# Patient Record
Sex: Female | Born: 1935 | Race: White | Hispanic: No | Marital: Single | Smoking: Never smoker
Health system: Southern US, Community
[De-identification: ages and names within clinical notes are randomized; demographics above are authoritative.]

## PROBLEM LIST (undated history)

## (undated) DIAGNOSIS — K5790 Diverticulosis of intestine, part unspecified, without perforation or abscess without bleeding: Secondary | ICD-10-CM

## (undated) DIAGNOSIS — R413 Other amnesia: Secondary | ICD-10-CM

## (undated) DIAGNOSIS — G44209 Tension-type headache, unspecified, not intractable: Secondary | ICD-10-CM

## (undated) DIAGNOSIS — H8109 Meniere's disease, unspecified ear: Secondary | ICD-10-CM

## (undated) DIAGNOSIS — E039 Hypothyroidism, unspecified: Secondary | ICD-10-CM

## (undated) DIAGNOSIS — C439 Malignant melanoma of skin, unspecified: Secondary | ICD-10-CM

## (undated) DIAGNOSIS — Z8619 Personal history of other infectious and parasitic diseases: Secondary | ICD-10-CM

## (undated) DIAGNOSIS — J309 Allergic rhinitis, unspecified: Secondary | ICD-10-CM

## (undated) DIAGNOSIS — I10 Essential (primary) hypertension: Secondary | ICD-10-CM

## (undated) DIAGNOSIS — T7840XA Allergy, unspecified, initial encounter: Secondary | ICD-10-CM

## (undated) DIAGNOSIS — F419 Anxiety disorder, unspecified: Secondary | ICD-10-CM

## (undated) DIAGNOSIS — K648 Other hemorrhoids: Secondary | ICD-10-CM

## (undated) DIAGNOSIS — D126 Benign neoplasm of colon, unspecified: Secondary | ICD-10-CM

## (undated) DIAGNOSIS — E7800 Pure hypercholesterolemia, unspecified: Secondary | ICD-10-CM

## (undated) DIAGNOSIS — Z7901 Long term (current) use of anticoagulants: Secondary | ICD-10-CM

## (undated) DIAGNOSIS — I4891 Unspecified atrial fibrillation: Secondary | ICD-10-CM

## (undated) HISTORY — DX: Diverticulosis of intestine, part unspecified, without perforation or abscess without bleeding: K57.90

## (undated) HISTORY — DX: Hypothyroidism, unspecified: E03.9

## (undated) HISTORY — DX: Malignant melanoma of skin, unspecified: C43.9

## (undated) HISTORY — DX: Personal history of other infectious and parasitic diseases: Z86.19

## (undated) HISTORY — DX: Tension-type headache, unspecified, not intractable: G44.209

## (undated) HISTORY — DX: Allergic rhinitis, unspecified: J30.9

## (undated) HISTORY — DX: Anxiety disorder, unspecified: F41.9

## (undated) HISTORY — DX: Other amnesia: R41.3

## (undated) HISTORY — DX: Other hemorrhoids: K64.8

## (undated) HISTORY — DX: Meniere's disease, unspecified ear: H81.09

## (undated) HISTORY — DX: Long term (current) use of anticoagulants: Z79.01

## (undated) HISTORY — DX: Allergy, unspecified, initial encounter: T78.40XA

## (undated) HISTORY — DX: Benign neoplasm of colon, unspecified: D12.6

---

## 1997-07-17 DIAGNOSIS — D126 Benign neoplasm of colon, unspecified: Secondary | ICD-10-CM

## 1997-07-17 HISTORY — DX: Benign neoplasm of colon, unspecified: D12.6

## 1998-03-18 ENCOUNTER — Other Ambulatory Visit: Admission: RE | Admit: 1998-03-18 | Discharge: 1998-03-18 | Payer: Self-pay | Admitting: Internal Medicine

## 1999-03-20 ENCOUNTER — Other Ambulatory Visit: Admission: RE | Admit: 1999-03-20 | Discharge: 1999-03-20 | Payer: Self-pay | Admitting: Internal Medicine

## 2000-04-20 ENCOUNTER — Other Ambulatory Visit: Admission: RE | Admit: 2000-04-20 | Discharge: 2000-04-20 | Payer: Self-pay | Admitting: Internal Medicine

## 2000-05-21 ENCOUNTER — Encounter: Payer: Self-pay | Admitting: Internal Medicine

## 2000-05-21 ENCOUNTER — Encounter: Admission: RE | Admit: 2000-05-21 | Discharge: 2000-05-21 | Payer: Self-pay | Admitting: Internal Medicine

## 2000-06-25 ENCOUNTER — Ambulatory Visit (HOSPITAL_COMMUNITY): Admission: RE | Admit: 2000-06-25 | Discharge: 2000-06-25 | Payer: Self-pay | Admitting: *Deleted

## 2001-04-22 ENCOUNTER — Other Ambulatory Visit: Admission: RE | Admit: 2001-04-22 | Discharge: 2001-04-22 | Payer: Self-pay | Admitting: Internal Medicine

## 2001-05-31 ENCOUNTER — Encounter: Admission: RE | Admit: 2001-05-31 | Discharge: 2001-05-31 | Payer: Self-pay | Admitting: Internal Medicine

## 2001-05-31 ENCOUNTER — Encounter: Payer: Self-pay | Admitting: Internal Medicine

## 2002-05-12 ENCOUNTER — Other Ambulatory Visit: Admission: RE | Admit: 2002-05-12 | Discharge: 2002-05-12 | Payer: Self-pay | Admitting: Internal Medicine

## 2002-05-31 ENCOUNTER — Encounter: Admission: RE | Admit: 2002-05-31 | Discharge: 2002-05-31 | Payer: Self-pay | Admitting: Internal Medicine

## 2002-05-31 ENCOUNTER — Encounter: Payer: Self-pay | Admitting: Internal Medicine

## 2003-06-05 ENCOUNTER — Encounter: Admission: RE | Admit: 2003-06-05 | Discharge: 2003-06-05 | Payer: Self-pay | Admitting: Internal Medicine

## 2003-06-05 ENCOUNTER — Encounter: Payer: Self-pay | Admitting: Internal Medicine

## 2004-06-12 ENCOUNTER — Encounter: Admission: RE | Admit: 2004-06-12 | Discharge: 2004-06-12 | Payer: Self-pay | Admitting: Internal Medicine

## 2004-06-17 ENCOUNTER — Other Ambulatory Visit: Admission: RE | Admit: 2004-06-17 | Discharge: 2004-06-17 | Payer: Self-pay | Admitting: Internal Medicine

## 2005-03-02 ENCOUNTER — Encounter: Admission: RE | Admit: 2005-03-02 | Discharge: 2005-03-02 | Payer: Self-pay | Admitting: Otolaryngology

## 2005-06-22 ENCOUNTER — Encounter: Admission: RE | Admit: 2005-06-22 | Discharge: 2005-06-22 | Payer: Self-pay | Admitting: Internal Medicine

## 2005-10-10 ENCOUNTER — Emergency Department (HOSPITAL_COMMUNITY): Admission: EM | Admit: 2005-10-10 | Discharge: 2005-10-10 | Payer: Self-pay | Admitting: Emergency Medicine

## 2006-06-23 ENCOUNTER — Encounter: Admission: RE | Admit: 2006-06-23 | Discharge: 2006-06-23 | Payer: Self-pay | Admitting: Internal Medicine

## 2007-04-22 ENCOUNTER — Other Ambulatory Visit: Admission: RE | Admit: 2007-04-22 | Discharge: 2007-04-22 | Payer: Self-pay | Admitting: Internal Medicine

## 2007-04-25 ENCOUNTER — Encounter: Admission: RE | Admit: 2007-04-25 | Discharge: 2007-04-25 | Payer: Self-pay | Admitting: Internal Medicine

## 2007-06-29 ENCOUNTER — Encounter: Admission: RE | Admit: 2007-06-29 | Discharge: 2007-06-29 | Payer: Self-pay | Admitting: Internal Medicine

## 2008-06-29 ENCOUNTER — Encounter: Admission: RE | Admit: 2008-06-29 | Discharge: 2008-06-29 | Payer: Self-pay | Admitting: Internal Medicine

## 2009-07-04 ENCOUNTER — Encounter: Admission: RE | Admit: 2009-07-04 | Discharge: 2009-07-04 | Payer: Self-pay | Admitting: Internal Medicine

## 2010-06-19 ENCOUNTER — Encounter: Admission: RE | Admit: 2010-06-19 | Discharge: 2010-06-19 | Payer: Self-pay | Admitting: Internal Medicine

## 2010-07-07 ENCOUNTER — Encounter: Admission: RE | Admit: 2010-07-07 | Discharge: 2010-07-07 | Payer: Self-pay | Admitting: Internal Medicine

## 2010-09-28 HISTORY — PX: CATARACT EXTRACTION: SUR2

## 2010-10-19 ENCOUNTER — Encounter: Payer: Self-pay | Admitting: Internal Medicine

## 2011-02-13 NOTE — Procedures (Signed)
[facility]  Patient:    Jamie Thompson, Jamie Thompson                        MRN: [id number] Proc. Date: 06/25/00 Adm. Date:  [id number] Attending:  Sabino Gasser                           Procedure Report  PROCEDURE:  Colonoscopy.  INDICATION FOR PROCEDURE:  Colon polyps.  ANESTHESIA:  Demerol 100 mg, Versed 8 mg was given intravenously.  DESCRIPTION OF PROCEDURE:  With the patient mildly sedated in the left lateral decubitus position with pressure applied to the abdomen, the Olympus videoscopic pediatric colonoscope was inserted in the rectum and passed under direct vision to the cecum to a very tortuous colon. The cecum was identified by the ileocecal valve and appendiceal orifice both of which were photographed. From this point, the colonoscope was slowly withdrawn taking circumferential views of the entire colonic mucosa, stopping in the rectum which appeared normal in direct view and showed small internal hemorrhoids on retroflexed view. The endoscope was straightened and withdrawn. The patients vital signs and pulse oximeter remained stable. The patient tolerated the procedure well without apparent complications.  FINDINGS:  Internal hemorrhoids otherwise negative exam.  PLAN:  Follow-up exam in possibly 5 years. DD:  06/25/00 TD:  06/25/00 Job: [id number]

## 2011-06-05 ENCOUNTER — Other Ambulatory Visit: Payer: Self-pay | Admitting: Internal Medicine

## 2011-06-05 DIAGNOSIS — Z1231 Encounter for screening mammogram for malignant neoplasm of breast: Secondary | ICD-10-CM

## 2011-07-09 ENCOUNTER — Ambulatory Visit: Payer: Self-pay

## 2011-07-31 ENCOUNTER — Ambulatory Visit
Admission: RE | Admit: 2011-07-31 | Discharge: 2011-07-31 | Disposition: A | Discharge status: Home / Self Care | Payer: Medicare Other | Source: Ambulatory Visit | Attending: Internal Medicine | Admitting: Internal Medicine

## 2011-07-31 DIAGNOSIS — Z1231 Encounter for screening mammogram for malignant neoplasm of breast: Secondary | ICD-10-CM

## 2011-10-02 DIAGNOSIS — L578 Other skin changes due to chronic exposure to nonionizing radiation: Secondary | ICD-10-CM | POA: Diagnosis not present

## 2011-10-02 DIAGNOSIS — L570 Actinic keratosis: Secondary | ICD-10-CM | POA: Diagnosis not present

## 2011-10-06 DIAGNOSIS — I4891 Unspecified atrial fibrillation: Secondary | ICD-10-CM | POA: Diagnosis not present

## 2011-10-06 DIAGNOSIS — Z7901 Long term (current) use of anticoagulants: Secondary | ICD-10-CM | POA: Diagnosis not present

## 2011-11-09 DIAGNOSIS — I4891 Unspecified atrial fibrillation: Secondary | ICD-10-CM | POA: Diagnosis not present

## 2011-11-09 DIAGNOSIS — Z7901 Long term (current) use of anticoagulants: Secondary | ICD-10-CM | POA: Diagnosis not present

## 2011-11-11 DIAGNOSIS — H35369 Drusen (degenerative) of macula, unspecified eye: Secondary | ICD-10-CM | POA: Diagnosis not present

## 2011-12-07 DIAGNOSIS — Z7901 Long term (current) use of anticoagulants: Secondary | ICD-10-CM | POA: Diagnosis not present

## 2011-12-07 DIAGNOSIS — I4891 Unspecified atrial fibrillation: Secondary | ICD-10-CM | POA: Diagnosis not present

## 2011-12-15 DIAGNOSIS — E7800 Pure hypercholesterolemia, unspecified: Secondary | ICD-10-CM | POA: Diagnosis not present

## 2011-12-15 DIAGNOSIS — I10 Essential (primary) hypertension: Secondary | ICD-10-CM | POA: Diagnosis not present

## 2011-12-17 DIAGNOSIS — L570 Actinic keratosis: Secondary | ICD-10-CM | POA: Diagnosis not present

## 2011-12-17 DIAGNOSIS — E7800 Pure hypercholesterolemia, unspecified: Secondary | ICD-10-CM | POA: Diagnosis not present

## 2011-12-17 DIAGNOSIS — K589 Irritable bowel syndrome without diarrhea: Secondary | ICD-10-CM | POA: Diagnosis not present

## 2011-12-17 DIAGNOSIS — I10 Essential (primary) hypertension: Secondary | ICD-10-CM | POA: Diagnosis not present

## 2012-01-07 DIAGNOSIS — I10 Essential (primary) hypertension: Secondary | ICD-10-CM | POA: Diagnosis not present

## 2012-01-07 DIAGNOSIS — I4891 Unspecified atrial fibrillation: Secondary | ICD-10-CM | POA: Diagnosis not present

## 2012-01-07 DIAGNOSIS — Z7901 Long term (current) use of anticoagulants: Secondary | ICD-10-CM | POA: Diagnosis not present

## 2012-01-18 DIAGNOSIS — R197 Diarrhea, unspecified: Secondary | ICD-10-CM | POA: Diagnosis not present

## 2012-01-18 DIAGNOSIS — J45909 Unspecified asthma, uncomplicated: Secondary | ICD-10-CM | POA: Diagnosis not present

## 2012-01-25 DIAGNOSIS — J45909 Unspecified asthma, uncomplicated: Secondary | ICD-10-CM | POA: Diagnosis not present

## 2012-02-08 DIAGNOSIS — Z7901 Long term (current) use of anticoagulants: Secondary | ICD-10-CM | POA: Diagnosis not present

## 2012-02-08 DIAGNOSIS — I10 Essential (primary) hypertension: Secondary | ICD-10-CM | POA: Diagnosis not present

## 2012-02-08 DIAGNOSIS — I4891 Unspecified atrial fibrillation: Secondary | ICD-10-CM | POA: Diagnosis not present

## 2012-02-24 DIAGNOSIS — T148 Other injury of unspecified body region: Secondary | ICD-10-CM | POA: Diagnosis not present

## 2012-02-24 DIAGNOSIS — H8109 Meniere's disease, unspecified ear: Secondary | ICD-10-CM | POA: Diagnosis not present

## 2012-02-24 DIAGNOSIS — L0390 Cellulitis, unspecified: Secondary | ICD-10-CM | POA: Diagnosis not present

## 2012-02-24 DIAGNOSIS — H905 Unspecified sensorineural hearing loss: Secondary | ICD-10-CM | POA: Diagnosis not present

## 2012-02-24 DIAGNOSIS — W57XXXA Bitten or stung by nonvenomous insect and other nonvenomous arthropods, initial encounter: Secondary | ICD-10-CM | POA: Diagnosis not present

## 2012-02-24 DIAGNOSIS — H903 Sensorineural hearing loss, bilateral: Secondary | ICD-10-CM | POA: Diagnosis not present

## 2012-02-24 DIAGNOSIS — L0291 Cutaneous abscess, unspecified: Secondary | ICD-10-CM | POA: Diagnosis not present

## 2012-03-15 DIAGNOSIS — Z7901 Long term (current) use of anticoagulants: Secondary | ICD-10-CM | POA: Diagnosis not present

## 2012-03-15 DIAGNOSIS — I4891 Unspecified atrial fibrillation: Secondary | ICD-10-CM | POA: Diagnosis not present

## 2012-03-15 DIAGNOSIS — I10 Essential (primary) hypertension: Secondary | ICD-10-CM | POA: Diagnosis not present

## 2012-04-05 DIAGNOSIS — F411 Generalized anxiety disorder: Secondary | ICD-10-CM | POA: Diagnosis not present

## 2012-04-14 DIAGNOSIS — I4891 Unspecified atrial fibrillation: Secondary | ICD-10-CM | POA: Diagnosis not present

## 2012-04-14 DIAGNOSIS — I10 Essential (primary) hypertension: Secondary | ICD-10-CM | POA: Diagnosis not present

## 2012-04-14 DIAGNOSIS — Z7901 Long term (current) use of anticoagulants: Secondary | ICD-10-CM | POA: Diagnosis not present

## 2012-05-16 DIAGNOSIS — I10 Essential (primary) hypertension: Secondary | ICD-10-CM | POA: Diagnosis not present

## 2012-05-16 DIAGNOSIS — Z7901 Long term (current) use of anticoagulants: Secondary | ICD-10-CM | POA: Diagnosis not present

## 2012-05-16 DIAGNOSIS — I4891 Unspecified atrial fibrillation: Secondary | ICD-10-CM | POA: Diagnosis not present

## 2012-05-28 ENCOUNTER — Emergency Department (HOSPITAL_BASED_OUTPATIENT_CLINIC_OR_DEPARTMENT_OTHER)
Admission: EM | Admit: 2012-05-28 | Discharge: 2012-05-28 | Disposition: A | Discharge status: Home / Self Care | Payer: Medicare Other | Attending: Emergency Medicine | Admitting: Emergency Medicine

## 2012-05-28 ENCOUNTER — Encounter (HOSPITAL_BASED_OUTPATIENT_CLINIC_OR_DEPARTMENT_OTHER): Payer: Self-pay | Admitting: *Deleted

## 2012-05-28 DIAGNOSIS — T63441A Toxic effect of venom of bees, accidental (unintentional), initial encounter: Secondary | ICD-10-CM

## 2012-05-28 DIAGNOSIS — T63461A Toxic effect of venom of wasps, accidental (unintentional), initial encounter: Secondary | ICD-10-CM | POA: Insufficient documentation

## 2012-05-28 DIAGNOSIS — E079 Disorder of thyroid, unspecified: Secondary | ICD-10-CM | POA: Insufficient documentation

## 2012-05-28 DIAGNOSIS — E7800 Pure hypercholesterolemia, unspecified: Secondary | ICD-10-CM | POA: Diagnosis not present

## 2012-05-28 DIAGNOSIS — I10 Essential (primary) hypertension: Secondary | ICD-10-CM | POA: Insufficient documentation

## 2012-05-28 DIAGNOSIS — I4891 Unspecified atrial fibrillation: Secondary | ICD-10-CM | POA: Insufficient documentation

## 2012-05-28 DIAGNOSIS — T6391XA Toxic effect of contact with unspecified venomous animal, accidental (unintentional), initial encounter: Secondary | ICD-10-CM | POA: Diagnosis not present

## 2012-05-28 HISTORY — DX: Pure hypercholesterolemia, unspecified: E78.00

## 2012-05-28 HISTORY — DX: Unspecified atrial fibrillation: I48.91

## 2012-05-28 HISTORY — DX: Essential (primary) hypertension: I10

## 2012-05-28 MED ORDER — PREDNISONE 10 MG PO TABS: 20.0000 mg | ORAL_TABLET | Freq: Every day | ORAL | Status: DC

## 2012-05-28 NOTE — ED Provider Notes (Signed)
History     CSN: [id number]  Arrival date & time 05/28/12  1239   First MD Initiated Contact with Patient 05/28/12 1301      Chief Complaint  Patient presents with  . Insect Bite    (Consider location/radiation/quality/duration/timing/severity/associated sxs/prior treatment) HPI Patient complaining of bee sting in multiple places. She was cleaning  out a [place] when a nest of bees flew out and stung her multiple times. She was stung on the back of her hand and forearm multiple times and on her right chest this occurred yesterday morning at 10:30. She has been taking Benadryl. She has a more localized redness and swelling of the right mid arm. She has not any difficulty breathing, nausea, vomiting, or lightheadedness Past Medical History  Diagnosis Date  . Hypertension   . Hypercholesteremia   . Atrial fibrillation   . Thyroid disease     History reviewed. No pertinent past surgical history.  History reviewed. No pertinent family history.  History  Substance Use Topics  . Smoking status: Never Smoker   . Smokeless tobacco: Not on file  . Alcohol Use: No    OB History    Grav Para Term Preterm Abortions TAB SAB Ect Mult Living                  Review of Systems  Constitutional: Negative for fever, chills, activity change, appetite change and unexpected weight change.  HENT: Negative for sore throat, rhinorrhea, neck pain, neck stiffness and sinus pressure.   Eyes: Negative for visual disturbance.  Respiratory: Negative for cough and shortness of breath.   Cardiovascular: Negative for chest pain and leg swelling.  Gastrointestinal: Negative for vomiting, abdominal pain, diarrhea and blood in stool.  Genitourinary: Negative for dysuria, urgency, frequency, vaginal discharge and difficulty urinating.  Musculoskeletal: Negative for myalgias, arthralgias and gait problem.  Skin: Negative for color change and rash.  Neurological: Negative for weakness, light-headedness and  headaches.  Hematological: Does not bruise/bleed easily.  Psychiatric/Behavioral: Negative for dysphoric mood.    Allergies  Review of patient's allergies indicates not on file.  Home Medications  No current outpatient prescriptions on file.  BP 142/74  Pulse 77  Temp 97.5 F (36.4 C) (Oral)  Resp 16  SpO2 99%  Physical Exam  Nursing note and vitals reviewed. Constitutional: She appears well-developed and well-nourished.  HENT:  Head: Normocephalic and atraumatic.  Eyes: Conjunctivae are normal. Pupils are equal, round, and reactive to light.  Neck: Normal range of motion. Neck supple.  Cardiovascular: An irregularly irregular rhythm present.  Pulmonary/Chest: Effort normal and breath sounds normal.  Abdominal: Soft. Bowel sounds are normal.  Musculoskeletal: Normal range of motion.  Neurological: She is alert.  Skin: Skin is warm and dry.       Erythema and redness dorsal aspect of right hand, right mid upper extremity, left face  Psychiatric: She has a normal mood and affect.    ED Course  Procedures (including critical care time)  Labs Reviewed - No data to display No results found.   No diagnosis found.    MDM  Plan continued benadryl and prednisone        Hilario Quarry, MD 05/28/12 1314

## 2012-05-28 NOTE — ED Notes (Signed)
Dr. Rosalia Hammers with patient. Pt states she got stung numerous times yesterday. No distress at present.

## 2012-05-31 DIAGNOSIS — I10 Essential (primary) hypertension: Secondary | ICD-10-CM | POA: Diagnosis not present

## 2012-05-31 DIAGNOSIS — Z7901 Long term (current) use of anticoagulants: Secondary | ICD-10-CM | POA: Diagnosis not present

## 2012-05-31 DIAGNOSIS — I4891 Unspecified atrial fibrillation: Secondary | ICD-10-CM | POA: Diagnosis not present

## 2012-06-30 DIAGNOSIS — I10 Essential (primary) hypertension: Secondary | ICD-10-CM | POA: Diagnosis not present

## 2012-06-30 DIAGNOSIS — I4891 Unspecified atrial fibrillation: Secondary | ICD-10-CM | POA: Diagnosis not present

## 2012-06-30 DIAGNOSIS — Z7901 Long term (current) use of anticoagulants: Secondary | ICD-10-CM | POA: Diagnosis not present

## 2012-07-01 DIAGNOSIS — Z5181 Encounter for therapeutic drug level monitoring: Secondary | ICD-10-CM | POA: Diagnosis not present

## 2012-07-01 DIAGNOSIS — L02419 Cutaneous abscess of limb, unspecified: Secondary | ICD-10-CM | POA: Diagnosis not present

## 2012-07-01 DIAGNOSIS — Z888 Allergy status to other drugs, medicaments and biological substances status: Secondary | ICD-10-CM | POA: Diagnosis not present

## 2012-07-04 ENCOUNTER — Other Ambulatory Visit: Payer: Self-pay | Admitting: Internal Medicine

## 2012-07-04 DIAGNOSIS — Z1231 Encounter for screening mammogram for malignant neoplasm of breast: Secondary | ICD-10-CM

## 2012-07-08 DIAGNOSIS — Z7901 Long term (current) use of anticoagulants: Secondary | ICD-10-CM | POA: Diagnosis not present

## 2012-07-08 DIAGNOSIS — E7800 Pure hypercholesterolemia, unspecified: Secondary | ICD-10-CM | POA: Diagnosis not present

## 2012-07-08 DIAGNOSIS — E039 Hypothyroidism, unspecified: Secondary | ICD-10-CM | POA: Diagnosis not present

## 2012-07-08 DIAGNOSIS — I10 Essential (primary) hypertension: Secondary | ICD-10-CM | POA: Diagnosis not present

## 2012-07-11 DIAGNOSIS — Z0000 Encounter for general adult medical examination without abnormal findings: Secondary | ICD-10-CM | POA: Diagnosis not present

## 2012-07-13 DIAGNOSIS — E7800 Pure hypercholesterolemia, unspecified: Secondary | ICD-10-CM | POA: Diagnosis not present

## 2012-07-13 DIAGNOSIS — Z7901 Long term (current) use of anticoagulants: Secondary | ICD-10-CM | POA: Diagnosis not present

## 2012-07-13 DIAGNOSIS — I4891 Unspecified atrial fibrillation: Secondary | ICD-10-CM | POA: Diagnosis not present

## 2012-07-13 DIAGNOSIS — Z23 Encounter for immunization: Secondary | ICD-10-CM | POA: Diagnosis not present

## 2012-07-13 DIAGNOSIS — Z0000 Encounter for general adult medical examination without abnormal findings: Secondary | ICD-10-CM | POA: Diagnosis not present

## 2012-07-13 DIAGNOSIS — Z1212 Encounter for screening for malignant neoplasm of rectum: Secondary | ICD-10-CM | POA: Diagnosis not present

## 2012-07-13 DIAGNOSIS — I10 Essential (primary) hypertension: Secondary | ICD-10-CM | POA: Diagnosis not present

## 2012-08-01 DIAGNOSIS — Z7901 Long term (current) use of anticoagulants: Secondary | ICD-10-CM | POA: Diagnosis not present

## 2012-08-01 DIAGNOSIS — I10 Essential (primary) hypertension: Secondary | ICD-10-CM | POA: Diagnosis not present

## 2012-08-01 DIAGNOSIS — I4891 Unspecified atrial fibrillation: Secondary | ICD-10-CM | POA: Diagnosis not present

## 2012-08-02 ENCOUNTER — Ambulatory Visit
Admission: RE | Admit: 2012-08-02 | Discharge: 2012-08-02 | Disposition: A | Discharge status: Home / Self Care | Payer: Medicare Other | Source: Ambulatory Visit | Attending: Internal Medicine | Admitting: Internal Medicine

## 2012-08-02 DIAGNOSIS — Z1231 Encounter for screening mammogram for malignant neoplasm of breast: Secondary | ICD-10-CM | POA: Diagnosis not present

## 2012-09-01 DIAGNOSIS — Z7901 Long term (current) use of anticoagulants: Secondary | ICD-10-CM | POA: Diagnosis not present

## 2012-09-01 DIAGNOSIS — I10 Essential (primary) hypertension: Secondary | ICD-10-CM | POA: Diagnosis not present

## 2012-09-01 DIAGNOSIS — I4891 Unspecified atrial fibrillation: Secondary | ICD-10-CM | POA: Diagnosis not present

## 2012-10-03 DIAGNOSIS — I10 Essential (primary) hypertension: Secondary | ICD-10-CM | POA: Diagnosis not present

## 2012-10-03 DIAGNOSIS — Z7901 Long term (current) use of anticoagulants: Secondary | ICD-10-CM | POA: Diagnosis not present

## 2012-10-03 DIAGNOSIS — I4891 Unspecified atrial fibrillation: Secondary | ICD-10-CM | POA: Diagnosis not present

## 2012-10-12 DIAGNOSIS — H2510 Age-related nuclear cataract, unspecified eye: Secondary | ICD-10-CM | POA: Diagnosis not present

## 2012-10-12 DIAGNOSIS — Z961 Presence of intraocular lens: Secondary | ICD-10-CM | POA: Diagnosis not present

## 2012-10-17 DIAGNOSIS — I10 Essential (primary) hypertension: Secondary | ICD-10-CM | POA: Diagnosis not present

## 2012-10-17 DIAGNOSIS — Z7901 Long term (current) use of anticoagulants: Secondary | ICD-10-CM | POA: Diagnosis not present

## 2012-10-17 DIAGNOSIS — I4891 Unspecified atrial fibrillation: Secondary | ICD-10-CM | POA: Diagnosis not present

## 2012-10-31 DIAGNOSIS — I10 Essential (primary) hypertension: Secondary | ICD-10-CM | POA: Diagnosis not present

## 2012-10-31 DIAGNOSIS — I4891 Unspecified atrial fibrillation: Secondary | ICD-10-CM | POA: Diagnosis not present

## 2012-10-31 DIAGNOSIS — Z7901 Long term (current) use of anticoagulants: Secondary | ICD-10-CM | POA: Diagnosis not present

## 2012-11-28 DIAGNOSIS — I4891 Unspecified atrial fibrillation: Secondary | ICD-10-CM | POA: Diagnosis not present

## 2012-11-28 DIAGNOSIS — I10 Essential (primary) hypertension: Secondary | ICD-10-CM | POA: Diagnosis not present

## 2012-11-28 DIAGNOSIS — Z7901 Long term (current) use of anticoagulants: Secondary | ICD-10-CM | POA: Diagnosis not present

## 2012-12-29 DIAGNOSIS — Z7901 Long term (current) use of anticoagulants: Secondary | ICD-10-CM | POA: Diagnosis not present

## 2012-12-29 DIAGNOSIS — E7800 Pure hypercholesterolemia, unspecified: Secondary | ICD-10-CM | POA: Diagnosis not present

## 2012-12-29 DIAGNOSIS — I4891 Unspecified atrial fibrillation: Secondary | ICD-10-CM | POA: Diagnosis not present

## 2012-12-29 DIAGNOSIS — I10 Essential (primary) hypertension: Secondary | ICD-10-CM | POA: Diagnosis not present

## 2013-01-04 DIAGNOSIS — E7800 Pure hypercholesterolemia, unspecified: Secondary | ICD-10-CM | POA: Diagnosis not present

## 2013-01-04 DIAGNOSIS — Z86010 Personal history of colonic polyps: Secondary | ICD-10-CM | POA: Diagnosis not present

## 2013-01-19 ENCOUNTER — Encounter: Payer: Self-pay | Admitting: *Deleted

## 2013-01-19 ENCOUNTER — Encounter: Payer: Self-pay | Admitting: Internal Medicine

## 2013-01-30 DIAGNOSIS — I4891 Unspecified atrial fibrillation: Secondary | ICD-10-CM | POA: Diagnosis not present

## 2013-01-30 DIAGNOSIS — Z7901 Long term (current) use of anticoagulants: Secondary | ICD-10-CM | POA: Diagnosis not present

## 2013-01-30 DIAGNOSIS — I10 Essential (primary) hypertension: Secondary | ICD-10-CM | POA: Diagnosis not present

## 2013-02-28 DIAGNOSIS — I10 Essential (primary) hypertension: Secondary | ICD-10-CM | POA: Diagnosis not present

## 2013-02-28 DIAGNOSIS — I4891 Unspecified atrial fibrillation: Secondary | ICD-10-CM | POA: Diagnosis not present

## 2013-02-28 DIAGNOSIS — Z7901 Long term (current) use of anticoagulants: Secondary | ICD-10-CM | POA: Diagnosis not present

## 2013-03-14 ENCOUNTER — Ambulatory Visit: Payer: Medicare Other | Admitting: Internal Medicine

## 2013-03-28 DIAGNOSIS — I4891 Unspecified atrial fibrillation: Secondary | ICD-10-CM | POA: Diagnosis not present

## 2013-03-28 DIAGNOSIS — E7800 Pure hypercholesterolemia, unspecified: Secondary | ICD-10-CM | POA: Diagnosis not present

## 2013-03-28 DIAGNOSIS — I10 Essential (primary) hypertension: Secondary | ICD-10-CM | POA: Diagnosis not present

## 2013-03-28 DIAGNOSIS — Z7901 Long term (current) use of anticoagulants: Secondary | ICD-10-CM | POA: Diagnosis not present

## 2013-03-28 DIAGNOSIS — Z006 Encounter for examination for normal comparison and control in clinical research program: Secondary | ICD-10-CM | POA: Diagnosis not present

## 2013-04-25 DIAGNOSIS — Z7901 Long term (current) use of anticoagulants: Secondary | ICD-10-CM | POA: Diagnosis not present

## 2013-04-25 DIAGNOSIS — I10 Essential (primary) hypertension: Secondary | ICD-10-CM | POA: Diagnosis not present

## 2013-04-25 DIAGNOSIS — J309 Allergic rhinitis, unspecified: Secondary | ICD-10-CM | POA: Diagnosis not present

## 2013-04-25 DIAGNOSIS — I4891 Unspecified atrial fibrillation: Secondary | ICD-10-CM | POA: Diagnosis not present

## 2013-05-01 DIAGNOSIS — I10 Essential (primary) hypertension: Secondary | ICD-10-CM | POA: Diagnosis not present

## 2013-05-01 DIAGNOSIS — Z7901 Long term (current) use of anticoagulants: Secondary | ICD-10-CM | POA: Diagnosis not present

## 2013-05-01 DIAGNOSIS — I4891 Unspecified atrial fibrillation: Secondary | ICD-10-CM | POA: Diagnosis not present

## 2013-05-18 DIAGNOSIS — I4891 Unspecified atrial fibrillation: Secondary | ICD-10-CM | POA: Diagnosis not present

## 2013-05-18 DIAGNOSIS — Z7901 Long term (current) use of anticoagulants: Secondary | ICD-10-CM | POA: Diagnosis not present

## 2013-05-26 ENCOUNTER — Ambulatory Visit (INDEPENDENT_AMBULATORY_CARE_PROVIDER_SITE_OTHER): Payer: Medicare Other | Admitting: Internal Medicine

## 2013-05-26 ENCOUNTER — Encounter: Payer: Self-pay | Admitting: Internal Medicine

## 2013-05-26 VITALS — BP 126/74 | HR 68 | Ht 65.0 in | Wt 128.6 lb

## 2013-05-26 DIAGNOSIS — Z86010 Personal history of colonic polyps: Secondary | ICD-10-CM | POA: Diagnosis not present

## 2013-05-26 DIAGNOSIS — Z1211 Encounter for screening for malignant neoplasm of colon: Secondary | ICD-10-CM

## 2013-05-26 NOTE — Progress Notes (Signed)
Jamie Thompson May 03, 1925 MRN [id number]   History of Present Illness:  This is an 79 year old white female who comes to discuss indications for a screening colonoscopy. She is asymptomatic as to rectal bleeding, abdominal pain or change in bowel habits. She has had 3 colonoscopies. The first one in 1998 showed a tubular adenoma. A subsequent colonoscopy in September 2001 showed a tortuous colon with internal hemorrhoids but no polyps. Her last colonoscopy was in November 2006 and again showed internal hemorrhoids and mild diverticulosis. There were no polyps. She has a positive family history of colon cancer in her sister who died of colon cancer at age 3.   Past Medical History  Diagnosis Date  . Hypertension   . Hypercholesteremia   . Atrial fibrillation   . Diverticulosis   . Meniere disease   . Anxiety disorder   . Hypothyroidism   . Adenomatous colon polyp 07/17/97  . Internal hemorrhoids    Past Surgical History  Procedure Laterality Date  . Cataract extraction Right 2012    reports that she has never smoked. She has never used smokeless tobacco. She reports that she does not drink alcohol or use illicit drugs. family history includes Clotting disorder in her father and sister; Colon cancer in her mother and sister; Stroke in her father. Allergies  Allergen Reactions  . Amitriptyline     Urinary Retention  . Erythromycin Hives  . Nitrofurantoin Nausea Only  . Sulfa Antibiotics     "really ill"  . Tavist [Clemastine]     Urinary hesitancy  . Trimethoprim     Cramps  . Verapamil     Constipation        Review of Systems: Negative for abdominal pain rectal bleeding or weight loss  The remainder of the 10 point ROS is negative except as outlined in H&P   Physical Exam: General appearance  Well developed, in no distress. Appearing younger than her  stated age Eyes- non icteric. HEENT nontraumatic, normocephalic. Mouth no lesions, tongue papillated, no  cheilosis. Neck supple without adenopathy, thyroid not enlarged, no carotid bruits, no JVD. Lungs Clear to auscultation bilaterally. Cor normal S1, normal S2, regular rhythm, no murmur,  quiet precordium. Abdomen: Soft nontender abdomen with normoactive bowel sounds. No distention. No tenderness. Rectal: Soft Hemoccult negative stool. Extremities no pedal edema. Skin no lesions. Neurological alert and oriented x 3. Psychological normal mood and affect.  Assessment and Plan:  Problem #55 79 year old white female who appears younger than her stated age. She is asymptomatic. She is Hemoccult-negative. She has had 3 colonoscopies. The last 2 colonoscopies did not show any evidence of colon polyps. She is at a higher risk for colon cancer because her sister died of colorectal neoplasia at age 45. Because of her advanced age, I feel that the risk of the colonoscopy at this time would exceed the benefits. She prefers not to have any further colonoscopies. We have agreed on no further  screening colonoscopies . If she has any specific problems, we will see her in the office.   05/26/2013 Jamie Thompson

## 2013-05-26 NOTE — Patient Instructions (Addendum)
CC: Dr Renne Crigler

## 2013-06-01 DIAGNOSIS — I4891 Unspecified atrial fibrillation: Secondary | ICD-10-CM | POA: Diagnosis not present

## 2013-06-01 DIAGNOSIS — Z7901 Long term (current) use of anticoagulants: Secondary | ICD-10-CM | POA: Diagnosis not present

## 2013-06-01 DIAGNOSIS — I10 Essential (primary) hypertension: Secondary | ICD-10-CM | POA: Diagnosis not present

## 2013-06-29 DIAGNOSIS — I4891 Unspecified atrial fibrillation: Secondary | ICD-10-CM | POA: Diagnosis not present

## 2013-06-29 DIAGNOSIS — Z7901 Long term (current) use of anticoagulants: Secondary | ICD-10-CM | POA: Diagnosis not present

## 2013-07-13 DIAGNOSIS — I10 Essential (primary) hypertension: Secondary | ICD-10-CM | POA: Diagnosis not present

## 2013-07-13 DIAGNOSIS — E039 Hypothyroidism, unspecified: Secondary | ICD-10-CM | POA: Diagnosis not present

## 2013-07-13 DIAGNOSIS — N390 Urinary tract infection, site not specified: Secondary | ICD-10-CM | POA: Diagnosis not present

## 2013-07-13 DIAGNOSIS — Z0000 Encounter for general adult medical examination without abnormal findings: Secondary | ICD-10-CM | POA: Diagnosis not present

## 2013-07-13 DIAGNOSIS — E7800 Pure hypercholesterolemia, unspecified: Secondary | ICD-10-CM | POA: Diagnosis not present

## 2013-07-18 DIAGNOSIS — Z7901 Long term (current) use of anticoagulants: Secondary | ICD-10-CM | POA: Diagnosis not present

## 2013-07-18 DIAGNOSIS — Z23 Encounter for immunization: Secondary | ICD-10-CM | POA: Diagnosis not present

## 2013-07-18 DIAGNOSIS — I4891 Unspecified atrial fibrillation: Secondary | ICD-10-CM | POA: Diagnosis not present

## 2013-07-18 DIAGNOSIS — Z1212 Encounter for screening for malignant neoplasm of rectum: Secondary | ICD-10-CM | POA: Diagnosis not present

## 2013-07-18 DIAGNOSIS — I10 Essential (primary) hypertension: Secondary | ICD-10-CM | POA: Diagnosis not present

## 2013-07-18 DIAGNOSIS — J309 Allergic rhinitis, unspecified: Secondary | ICD-10-CM | POA: Diagnosis not present

## 2013-07-27 DIAGNOSIS — Z7901 Long term (current) use of anticoagulants: Secondary | ICD-10-CM | POA: Diagnosis not present

## 2013-07-27 DIAGNOSIS — I4891 Unspecified atrial fibrillation: Secondary | ICD-10-CM | POA: Diagnosis not present

## 2013-07-28 ENCOUNTER — Other Ambulatory Visit: Payer: Self-pay

## 2013-07-28 DIAGNOSIS — Z1231 Encounter for screening mammogram for malignant neoplasm of breast: Secondary | ICD-10-CM

## 2013-08-08 DIAGNOSIS — M899 Disorder of bone, unspecified: Secondary | ICD-10-CM | POA: Diagnosis not present

## 2013-08-10 DIAGNOSIS — I4891 Unspecified atrial fibrillation: Secondary | ICD-10-CM | POA: Diagnosis not present

## 2013-08-10 DIAGNOSIS — Z7901 Long term (current) use of anticoagulants: Secondary | ICD-10-CM | POA: Diagnosis not present

## 2013-08-22 DIAGNOSIS — Z7901 Long term (current) use of anticoagulants: Secondary | ICD-10-CM | POA: Diagnosis not present

## 2013-08-22 DIAGNOSIS — I4891 Unspecified atrial fibrillation: Secondary | ICD-10-CM | POA: Diagnosis not present

## 2013-08-31 ENCOUNTER — Ambulatory Visit: Payer: Medicare Other

## 2013-08-31 DIAGNOSIS — I10 Essential (primary) hypertension: Secondary | ICD-10-CM | POA: Diagnosis not present

## 2013-08-31 DIAGNOSIS — Z7901 Long term (current) use of anticoagulants: Secondary | ICD-10-CM | POA: Diagnosis not present

## 2013-08-31 DIAGNOSIS — I4891 Unspecified atrial fibrillation: Secondary | ICD-10-CM | POA: Diagnosis not present

## 2013-09-14 DIAGNOSIS — Z7901 Long term (current) use of anticoagulants: Secondary | ICD-10-CM | POA: Diagnosis not present

## 2013-09-14 DIAGNOSIS — I4891 Unspecified atrial fibrillation: Secondary | ICD-10-CM | POA: Diagnosis not present

## 2013-10-02 DIAGNOSIS — Z7901 Long term (current) use of anticoagulants: Secondary | ICD-10-CM | POA: Diagnosis not present

## 2013-10-02 DIAGNOSIS — I10 Essential (primary) hypertension: Secondary | ICD-10-CM | POA: Diagnosis not present

## 2013-10-02 DIAGNOSIS — I4891 Unspecified atrial fibrillation: Secondary | ICD-10-CM | POA: Diagnosis not present

## 2013-10-13 DIAGNOSIS — Z85828 Personal history of other malignant neoplasm of skin: Secondary | ICD-10-CM | POA: Diagnosis not present

## 2013-10-13 DIAGNOSIS — L570 Actinic keratosis: Secondary | ICD-10-CM | POA: Diagnosis not present

## 2013-10-13 DIAGNOSIS — L821 Other seborrheic keratosis: Secondary | ICD-10-CM | POA: Diagnosis not present

## 2013-10-13 DIAGNOSIS — L819 Disorder of pigmentation, unspecified: Secondary | ICD-10-CM | POA: Diagnosis not present

## 2013-10-19 DIAGNOSIS — Z961 Presence of intraocular lens: Secondary | ICD-10-CM | POA: Diagnosis not present

## 2013-10-19 DIAGNOSIS — H2510 Age-related nuclear cataract, unspecified eye: Secondary | ICD-10-CM | POA: Diagnosis not present

## 2013-10-30 DIAGNOSIS — Z7901 Long term (current) use of anticoagulants: Secondary | ICD-10-CM | POA: Diagnosis not present

## 2013-10-30 DIAGNOSIS — I4891 Unspecified atrial fibrillation: Secondary | ICD-10-CM | POA: Diagnosis not present

## 2013-11-27 DIAGNOSIS — Z7901 Long term (current) use of anticoagulants: Secondary | ICD-10-CM | POA: Diagnosis not present

## 2013-11-27 DIAGNOSIS — I4891 Unspecified atrial fibrillation: Secondary | ICD-10-CM | POA: Diagnosis not present

## 2013-12-12 DIAGNOSIS — I4891 Unspecified atrial fibrillation: Secondary | ICD-10-CM | POA: Diagnosis not present

## 2013-12-12 DIAGNOSIS — H8109 Meniere's disease, unspecified ear: Secondary | ICD-10-CM | POA: Diagnosis not present

## 2013-12-12 DIAGNOSIS — Z7901 Long term (current) use of anticoagulants: Secondary | ICD-10-CM | POA: Diagnosis not present

## 2013-12-26 DIAGNOSIS — I4891 Unspecified atrial fibrillation: Secondary | ICD-10-CM | POA: Diagnosis not present

## 2013-12-26 DIAGNOSIS — Z7901 Long term (current) use of anticoagulants: Secondary | ICD-10-CM | POA: Diagnosis not present

## 2013-12-26 DIAGNOSIS — I10 Essential (primary) hypertension: Secondary | ICD-10-CM | POA: Diagnosis not present

## 2013-12-28 ENCOUNTER — Ambulatory Visit
Admission: RE | Admit: 2013-12-28 | Discharge: 2013-12-28 | Disposition: A | Discharge status: Home / Self Care | Payer: Medicare Other | Source: Ambulatory Visit

## 2013-12-28 DIAGNOSIS — Z1231 Encounter for screening mammogram for malignant neoplasm of breast: Secondary | ICD-10-CM

## 2014-01-09 DIAGNOSIS — L658 Other specified nonscarring hair loss: Secondary | ICD-10-CM | POA: Diagnosis not present

## 2014-01-09 DIAGNOSIS — L570 Actinic keratosis: Secondary | ICD-10-CM | POA: Diagnosis not present

## 2014-01-09 DIAGNOSIS — D485 Neoplasm of uncertain behavior of skin: Secondary | ICD-10-CM | POA: Diagnosis not present

## 2014-01-09 DIAGNOSIS — L821 Other seborrheic keratosis: Secondary | ICD-10-CM | POA: Diagnosis not present

## 2014-01-11 DIAGNOSIS — E7800 Pure hypercholesterolemia, unspecified: Secondary | ICD-10-CM | POA: Diagnosis not present

## 2014-01-16 DIAGNOSIS — I4891 Unspecified atrial fibrillation: Secondary | ICD-10-CM | POA: Diagnosis not present

## 2014-01-16 DIAGNOSIS — R922 Inconclusive mammogram: Secondary | ICD-10-CM | POA: Diagnosis not present

## 2014-01-16 DIAGNOSIS — F411 Generalized anxiety disorder: Secondary | ICD-10-CM | POA: Diagnosis not present

## 2014-01-16 DIAGNOSIS — I10 Essential (primary) hypertension: Secondary | ICD-10-CM | POA: Diagnosis not present

## 2014-01-16 DIAGNOSIS — E7800 Pure hypercholesterolemia, unspecified: Secondary | ICD-10-CM | POA: Diagnosis not present

## 2014-01-23 DIAGNOSIS — Z7901 Long term (current) use of anticoagulants: Secondary | ICD-10-CM | POA: Diagnosis not present

## 2014-01-23 DIAGNOSIS — I4891 Unspecified atrial fibrillation: Secondary | ICD-10-CM | POA: Diagnosis not present

## 2014-02-20 DIAGNOSIS — F411 Generalized anxiety disorder: Secondary | ICD-10-CM | POA: Diagnosis not present

## 2014-02-20 DIAGNOSIS — I4891 Unspecified atrial fibrillation: Secondary | ICD-10-CM | POA: Diagnosis not present

## 2014-02-20 DIAGNOSIS — Z7901 Long term (current) use of anticoagulants: Secondary | ICD-10-CM | POA: Diagnosis not present

## 2014-03-13 DIAGNOSIS — I4891 Unspecified atrial fibrillation: Secondary | ICD-10-CM | POA: Diagnosis not present

## 2014-03-13 DIAGNOSIS — Z7901 Long term (current) use of anticoagulants: Secondary | ICD-10-CM | POA: Diagnosis not present

## 2014-03-13 DIAGNOSIS — I10 Essential (primary) hypertension: Secondary | ICD-10-CM | POA: Diagnosis not present

## 2014-03-27 DIAGNOSIS — I4891 Unspecified atrial fibrillation: Secondary | ICD-10-CM | POA: Diagnosis not present

## 2014-03-27 DIAGNOSIS — Z7901 Long term (current) use of anticoagulants: Secondary | ICD-10-CM | POA: Diagnosis not present

## 2014-04-10 DIAGNOSIS — Z7901 Long term (current) use of anticoagulants: Secondary | ICD-10-CM | POA: Diagnosis not present

## 2014-04-10 DIAGNOSIS — I4891 Unspecified atrial fibrillation: Secondary | ICD-10-CM | POA: Diagnosis not present

## 2014-04-24 DIAGNOSIS — Z7901 Long term (current) use of anticoagulants: Secondary | ICD-10-CM | POA: Diagnosis not present

## 2014-04-24 DIAGNOSIS — I4891 Unspecified atrial fibrillation: Secondary | ICD-10-CM | POA: Diagnosis not present

## 2014-05-24 DIAGNOSIS — Z7901 Long term (current) use of anticoagulants: Secondary | ICD-10-CM | POA: Diagnosis not present

## 2014-05-24 DIAGNOSIS — I4891 Unspecified atrial fibrillation: Secondary | ICD-10-CM | POA: Diagnosis not present

## 2014-06-21 DIAGNOSIS — Z7901 Long term (current) use of anticoagulants: Secondary | ICD-10-CM | POA: Diagnosis not present

## 2014-06-21 DIAGNOSIS — I4891 Unspecified atrial fibrillation: Secondary | ICD-10-CM | POA: Diagnosis not present

## 2014-06-25 DIAGNOSIS — IMO0002 Reserved for concepts with insufficient information to code with codable children: Secondary | ICD-10-CM | POA: Diagnosis not present

## 2014-07-05 DIAGNOSIS — I4891 Unspecified atrial fibrillation: Secondary | ICD-10-CM | POA: Diagnosis not present

## 2014-07-05 DIAGNOSIS — Z7901 Long term (current) use of anticoagulants: Secondary | ICD-10-CM | POA: Diagnosis not present

## 2014-07-26 DIAGNOSIS — H903 Sensorineural hearing loss, bilateral: Secondary | ICD-10-CM | POA: Diagnosis not present

## 2014-08-06 DIAGNOSIS — I10 Essential (primary) hypertension: Secondary | ICD-10-CM | POA: Diagnosis not present

## 2014-08-06 DIAGNOSIS — Z0000 Encounter for general adult medical examination without abnormal findings: Secondary | ICD-10-CM | POA: Diagnosis not present

## 2014-08-06 DIAGNOSIS — E780 Pure hypercholesterolemia: Secondary | ICD-10-CM | POA: Diagnosis not present

## 2014-08-06 DIAGNOSIS — E039 Hypothyroidism, unspecified: Secondary | ICD-10-CM | POA: Diagnosis not present

## 2014-08-07 DIAGNOSIS — Z7901 Long term (current) use of anticoagulants: Secondary | ICD-10-CM | POA: Diagnosis not present

## 2014-08-07 DIAGNOSIS — I4891 Unspecified atrial fibrillation: Secondary | ICD-10-CM | POA: Diagnosis not present

## 2014-08-09 DIAGNOSIS — E780 Pure hypercholesterolemia: Secondary | ICD-10-CM | POA: Diagnosis not present

## 2014-08-09 DIAGNOSIS — Z882 Allergy status to sulfonamides status: Secondary | ICD-10-CM | POA: Diagnosis not present

## 2014-08-09 DIAGNOSIS — J309 Allergic rhinitis, unspecified: Secondary | ICD-10-CM | POA: Diagnosis not present

## 2014-08-09 DIAGNOSIS — I10 Essential (primary) hypertension: Secondary | ICD-10-CM | POA: Diagnosis not present

## 2014-08-09 DIAGNOSIS — Z23 Encounter for immunization: Secondary | ICD-10-CM | POA: Diagnosis not present

## 2014-08-14 DIAGNOSIS — H8103 Meniere's disease, bilateral: Secondary | ICD-10-CM | POA: Diagnosis not present

## 2014-08-14 DIAGNOSIS — H903 Sensorineural hearing loss, bilateral: Secondary | ICD-10-CM | POA: Diagnosis not present

## 2014-08-14 DIAGNOSIS — H6123 Impacted cerumen, bilateral: Secondary | ICD-10-CM | POA: Diagnosis not present

## 2014-09-04 DIAGNOSIS — Z7901 Long term (current) use of anticoagulants: Secondary | ICD-10-CM | POA: Diagnosis not present

## 2014-09-04 DIAGNOSIS — I4891 Unspecified atrial fibrillation: Secondary | ICD-10-CM | POA: Diagnosis not present

## 2014-09-18 DIAGNOSIS — I4891 Unspecified atrial fibrillation: Secondary | ICD-10-CM | POA: Diagnosis not present

## 2014-09-18 DIAGNOSIS — Z7901 Long term (current) use of anticoagulants: Secondary | ICD-10-CM | POA: Diagnosis not present

## 2014-10-16 DIAGNOSIS — Z7901 Long term (current) use of anticoagulants: Secondary | ICD-10-CM | POA: Diagnosis not present

## 2014-10-16 DIAGNOSIS — I482 Chronic atrial fibrillation: Secondary | ICD-10-CM | POA: Diagnosis not present

## 2014-11-13 DIAGNOSIS — I4891 Unspecified atrial fibrillation: Secondary | ICD-10-CM | POA: Diagnosis not present

## 2014-11-13 DIAGNOSIS — Z7901 Long term (current) use of anticoagulants: Secondary | ICD-10-CM | POA: Diagnosis not present

## 2014-11-30 DIAGNOSIS — H26491 Other secondary cataract, right eye: Secondary | ICD-10-CM | POA: Diagnosis not present

## 2014-11-30 DIAGNOSIS — H2512 Age-related nuclear cataract, left eye: Secondary | ICD-10-CM | POA: Diagnosis not present

## 2014-12-11 DIAGNOSIS — Z7901 Long term (current) use of anticoagulants: Secondary | ICD-10-CM | POA: Diagnosis not present

## 2014-12-11 DIAGNOSIS — I482 Chronic atrial fibrillation: Secondary | ICD-10-CM | POA: Diagnosis not present

## 2015-01-08 DIAGNOSIS — Z7901 Long term (current) use of anticoagulants: Secondary | ICD-10-CM | POA: Diagnosis not present

## 2015-01-08 DIAGNOSIS — I482 Chronic atrial fibrillation: Secondary | ICD-10-CM | POA: Diagnosis not present

## 2015-02-05 DIAGNOSIS — Z7901 Long term (current) use of anticoagulants: Secondary | ICD-10-CM | POA: Diagnosis not present

## 2015-02-05 DIAGNOSIS — I4891 Unspecified atrial fibrillation: Secondary | ICD-10-CM | POA: Diagnosis not present

## 2015-02-06 DIAGNOSIS — E780 Pure hypercholesterolemia: Secondary | ICD-10-CM | POA: Diagnosis not present

## 2015-02-06 DIAGNOSIS — E039 Hypothyroidism, unspecified: Secondary | ICD-10-CM | POA: Diagnosis not present

## 2015-02-06 DIAGNOSIS — R413 Other amnesia: Secondary | ICD-10-CM | POA: Diagnosis not present

## 2015-02-12 DIAGNOSIS — E039 Hypothyroidism, unspecified: Secondary | ICD-10-CM | POA: Diagnosis not present

## 2015-02-12 DIAGNOSIS — I10 Essential (primary) hypertension: Secondary | ICD-10-CM | POA: Diagnosis not present

## 2015-02-12 DIAGNOSIS — R413 Other amnesia: Secondary | ICD-10-CM | POA: Diagnosis not present

## 2015-02-12 DIAGNOSIS — F419 Anxiety disorder, unspecified: Secondary | ICD-10-CM | POA: Diagnosis not present

## 2015-02-12 DIAGNOSIS — E780 Pure hypercholesterolemia: Secondary | ICD-10-CM | POA: Diagnosis not present

## 2015-02-26 ENCOUNTER — Encounter: Payer: Self-pay | Admitting: Neurology

## 2015-02-26 ENCOUNTER — Ambulatory Visit (INDEPENDENT_AMBULATORY_CARE_PROVIDER_SITE_OTHER): Payer: Medicare Other | Admitting: Neurology

## 2015-02-26 VITALS — BP 142/68 | HR 72 | Resp 14 | Ht 65.0 in | Wt 129.0 lb

## 2015-02-26 DIAGNOSIS — H9193 Unspecified hearing loss, bilateral: Secondary | ICD-10-CM

## 2015-02-26 DIAGNOSIS — H8101 Meniere's disease, right ear: Secondary | ICD-10-CM

## 2015-02-26 DIAGNOSIS — R413 Other amnesia: Secondary | ICD-10-CM | POA: Diagnosis not present

## 2015-02-26 NOTE — Progress Notes (Signed)
Subjective:    Patient ID: Jamie Thompson is a 81 y.o. female.  HPI     [name], MD, PhD [facility] [address] [place], [place] [address]  Dear Dr. Shelia Media,   I saw your patient, Jamie Thompson, upon your kind request in my neurologic clinic today for initial consultation of her memory loss. The patient is accompanied by her daughter, Jamie Thompson, today. As you know, Jamie Thompson is a 81 year old right-handed woman with an underlying medical history of A. fib, hypertension, hyperlipidemia, Mnire's disease with bilateral hearing loss, anxiety, hypothyroidism, diverticulosis, status post right cataract extraction, allergic rhinitis, and vitamin D deficiency, who reports short-term memory issues for the past few months. She reported some forgetfulness, misplacing things, and forgetting conversations. She still drives. She drives locally for the most part. She has not had any issues driving but her daughter has not recently observed her driving. Per daughter, she has had a 6-9 month history of forgetfulness, misplacing things, forgetting events or conversations. Familiar faces are easily recognized. She has no evidence of paranoia, no other delusions, no delusions, no hallucinations, and no personality changes or behavioral changes. She lives at friend's home in independent living. She has 2 daughters, Jamie Thompson his local and her other daughters in [place]. She does not have a strong family history of dementia but a maternal aunt had Alzheimer's disease. Her mother lived to be 48 years old, her father died at the age of 84. She is a lifelong nonsmoker and does not currently drink any alcohol. She has never had any heavy alcohol use. She does not use illicit drugs. She sleeps fairly well. She has not had any confusion or disorientation. She tries to walk regularly. She goes to the dining room once a day for lunch. She eats fairly well. I reviewed your office  note from 02/12/2015 which you kindly included. Recent blood work from 02/06/2015 showed normal liver function, cholesterol of 148, triglycerides of 57, LDL of 84. She had a brain MRI with and without contrast on 03/02/2005: 1.  No internal auditory canal enhancing lesion or acute infarct. 2.  Mild age related atrophy.  Mild nonspecific white matter changes may be related to sequela of small vessel disease. 3.  Cervical spondylotic changes with spinal stenosis C3-4 and C4-5 incompletely evaluated on the present exam.  In addition, I personally reviewed the images through the PACS system.  Her Past Medical History Is Significant For: Past Medical History  Diagnosis Date  . Hypertension   . Hypercholesteremia   . Atrial fibrillation   . Diverticulosis   . Meniere disease   . Anxiety disorder   . Hypothyroidism   . Adenomatous colon polyp 07/17/97  . Internal hemorrhoids   . Memory loss   . Chronic anticoagulation   . History of shingles   . Allergic rhinitis     Her Past Surgical History Is Significant For: Past Surgical History  Procedure Laterality Date  . Cataract extraction Right 2012    Her Family History Is Significant For: Family History  Problem Relation Age of Onset  . Stroke Father   . Clotting disorder Father   . Colon cancer Mother   . Colon cancer Sister   . Clotting disorder Sister     Her Social History Is Significant For: History   Social History  . Marital Status: Single    Spouse Name: N/A  . Number of Children: 2  . Years of Education: 98yr colg  Occupational History  . Retired    Social History Main Topics  . Smoking status: Never Smoker   . Smokeless tobacco: Never Used  . Alcohol Use: No  . Drug Use: No  . Sexual Activity: Not on file   Other Topics Concern  . None   Social History Narrative   1 cup of coffee a day, occasionally drinks tea or soda    Her Allergies Are:  Allergies  Allergen Reactions  . Amitriptyline     Urinary  Retention  . Erythromycin Hives  . Nitrofurantoin Nausea Only  . Sulfa Antibiotics     "really ill"  . Tavist [Clemastine]     Urinary hesitancy  . Trimethoprim     Cramps  . Verapamil     Constipation  :   Her Current Medications Are:  Outpatient Encounter Prescriptions as of 02/26/2015  Medication Sig  . atorvastatin (LIPITOR) 10 MG tablet Take 10 mg by mouth daily.  . calcium citrate (CALCITRATE - DOSED IN MG ELEMENTAL CALCIUM) 950 MG tablet Take 200 mg of elemental calcium by mouth daily.  . Calcium-Magnesium-Vitamin D 211-94-174 MG-MG-UNIT TB24 Take 1 tablet by mouth daily.  . carvedilol (COREG) 25 MG tablet Take 25 mg by mouth 2 (two) times daily with a meal.  . diazepam (VALIUM) 5 MG tablet Take 5 mg by mouth as needed.   Marland Kitchen levothyroxine (SYNTHROID, LEVOTHROID) 50 MCG tablet Take 50 mcg by mouth daily.  Marland Kitchen warfarin (COUMADIN) 2.5 MG tablet Take 2.5 mg by mouth daily. Tues, Wed, Thurs, Sat, Sun  . warfarin (COUMADIN) 5 MG tablet Take 5 mg by mouth daily. Monday and Friday  . [DISCONTINUED] cholecalciferol (VITAMIN D) 1000 UNITS tablet Take 1,000 Units by mouth daily.   No facility-administered encounter medications on file as of 02/26/2015.  :  Review of Systems:  Out of a complete 14 point review of systems, all are reviewed and negative with the exception of these symptoms as listed below:    Review of Systems  HENT: Positive for hearing loss.   Neurological:       Memory loss, confusion, Insomnia  Psychiatric/Behavioral:       Anxiety     Objective:  Neurologic Exam  Physical Exam Physical Examination:   Filed Vitals:   02/26/15 1424  BP: 142/68  Pulse: 72  Resp: 14    General Examination: The patient is a very pleasant 81 y.o. female in no acute distress. She is calm and cooperative with the exam. She denies Auditory Hallucinations and Visual Hallucinations. She is very well groomed and situated in a chair. She is in good spirits.   HEENT: Normocephalic,  atraumatic, pupils are equal, round and reactive to light and accommodation. Funduscopic exam is normal with sharp disc margins noted. Extraocular tracking shows no saccadic breakdown without nystagmus noted. Hearing is impaired with bilateral hearing aids in place. Face is symmetric with no facial masking and normal facial sensation. There is no lip, neck or jaw tremor. Neck is not rigid with intact passive ROM. There are no carotid bruits on auscultation. Oropharynx exam reveals mild mouth dryness. No significant airway crowding is noted. Mallampati is class II. Tongue protrudes centrally and palate elevates symmetrically.    Chest: is clear to auscultation without wheezing, rhonchi or crackles noted.  Heart: sounds are regular and normal without murmurs, rubs or gallops noted.   Abdomen: is soft, non-tender and non-distended with normal bowel sounds appreciated on auscultation.  Extremities: There is no pitting edema  in the distal lower extremities bilaterally. Pedal pulses are intact.   Skin: is warm and dry with no trophic changes noted. Age-related changes are noted on the skin.   Musculoskeletal: exam reveals no obvious joint deformities, tenderness or joint swelling or erythema. Mild changes consistent with OA of the hands are noted bilaterally.   Neurologically:  Mental status: The patient is awake and alert, paying good  attention. She is able to provide a good portion of the history. Details are provided by her daughter. She is oriented to: person, place, time/date, situation, day of week, month of year and year. Her memory, attention, language and knowledge are impaired mildly. There is no aphasia, agnosia, apraxia or anomia. There is a mild degree of bradyphrenia. Speech is not hypophonic with no dysarthria noted. Mood is congruent and affect is normal.  On 02/26/2015: Her MMSE (Mini-Mental state exam) score is 24/30.  CDT (Clock Drawing Test) score is 3/4.  AFT (Animal Fluency Test)  score is 9.   Geriatric Depression Scale Score is 0.  Cranial nerves are as described above under HEENT exam. In addition, shoulder shrug is normal with equal shoulder height noted.  Motor exam: Normal bulk, and strength for age is noted. Tone is not rigid with absence of cogwheeling.  Findings motor skills are intact. There is no resting tremor or postural or action tremor. Finger to nose testing and heel-to-shin are normal bilaterally. Reflexes are 2+ in the upper extremities and 1+ in the lower extremity is. Toes are downgoing bilaterally.  Sensory exam is intact to light touch, pinprick, vibration, temperature sense and proprioception in the upper and lower extremities.   Gait, station and balance: She stands up from the seated position with no difficulty and needs no assistance. Posture is age-appropriate. Stance is slightly cautious and slow. Tandem walk is not possible because she feels insecure.  Assessment and Plan:   In summary, Jamie Thompson is a very pleasant 81 y.o.-year old female with an underlying medical history of A. fib, hypertension, hyperlipidemia, Mnire's disease with bilateral hearing loss, anxiety, hypothyroidism, diverticulosis, status post right cataract extraction, allergic rhinitis, and vitamin D deficiency, who reports short-term memory issues for the past few months. Her history and physical exam are in keeping with mild memory loss, possibly in keeping with age-related memory loss versus mild cognitive impairment. I talked to patient and her daughter at length about her symptoms in my findings. We talked about memory loss and dementia, the prognosis and treatment options. I would like to hold off on any memory medication at this time. I would like to do an MRI without contrast and compared findings to her last MRI which was about 10 years ago. We do expect some changes of the last 10 years. We did talk about potential pharmacological intervention with dementia medication  but at this point I would like to hold off on monitor her symptoms. Memory medications are not without potential side effects. We will request recent blood test results from your office.  We talked about maintaining a healthy lifestyle in general and staying active mentally and physically. I encouraged the patient to eat healthy, exercise daily and keep well hydrated, to keep a scheduled bedtime and wake time routine, to not skip any meals and eat healthy snacks in between meals and to have protein with every meal. I stressed the importance of regular exercise, within of course the patient's own mobility limitations. I also talked to them about her driving. Her daughter is  advised to observe her driving for safety. She does admit that she has not ridden with her mother as the driver and quite some time. I suggested she stay with a local roads, avoid highways and interstates and avoid driving at night for now. We also talked about long-term living options for her such as transitioning into assisted living down the [place]. At this juncture, I will see her back in about 4 months for recheck and we will call her with her MRI results in the interim. I answered all their questions today and the patient and her daughter were in agreement.   Thank you very much for allowing me to participate in the care of this nice patient. If I can be of any further assistance to you please do not hesitate to call me at [phone].  Sincerely,   [name], MD, PhD

## 2015-02-26 NOTE — Patient Instructions (Signed)
You have complaints of memory loss: memory loss or changes in cognitive function can have many reasons and does not always mean you have dementia. Conditions that can contribute to subjective or objective memory loss include: depression, stress, poor sleep from insomnia or sleep apnea, dehydration, fluctuation in blood sugar values, thyroid or electrolyte dysfunction. Dementia can be causes by stroke, brain atherosclerosis and by Alzheimer's disease or other, more rare and sometimes hereditary causes. We will do another brain scan and will not start medication as yet. We will call you with the test results and monitor your symptoms. Your memory loss is rather mild at this point and could be in keeping with age related memory loss.   Caroline: Please observe/witness your mom's driving.   Avoid driving at night, on the interstate, on highways.

## 2015-03-05 DIAGNOSIS — Z7901 Long term (current) use of anticoagulants: Secondary | ICD-10-CM | POA: Diagnosis not present

## 2015-03-05 DIAGNOSIS — I482 Chronic atrial fibrillation: Secondary | ICD-10-CM | POA: Diagnosis not present

## 2015-03-09 ENCOUNTER — Ambulatory Visit
Admission: RE | Admit: 2015-03-09 | Discharge: 2015-03-09 | Disposition: A | Discharge status: Home / Self Care | Payer: Medicare Other | Source: Ambulatory Visit | Attending: Neurology | Admitting: Neurology

## 2015-03-09 DIAGNOSIS — R413 Other amnesia: Secondary | ICD-10-CM

## 2015-03-11 ENCOUNTER — Telehealth: Payer: Self-pay

## 2015-03-11 NOTE — Progress Notes (Signed)
Quick Note:  Please call patient: brain MRI showed age appropriate findings, which is reassuring. No acute changes and no previous or new stroke and no other abnormalities.  [name], MD, PhD [facility] ([facility]  ______

## 2015-03-11 NOTE — Telephone Encounter (Signed)
Patient was informed of MRI results, she verbalizes understanding.

## 2015-04-02 DIAGNOSIS — L600 Ingrowing nail: Secondary | ICD-10-CM | POA: Diagnosis not present

## 2015-04-18 DIAGNOSIS — Z7901 Long term (current) use of anticoagulants: Secondary | ICD-10-CM | POA: Diagnosis not present

## 2015-04-18 DIAGNOSIS — I482 Chronic atrial fibrillation: Secondary | ICD-10-CM | POA: Diagnosis not present

## 2015-04-26 DIAGNOSIS — N390 Urinary tract infection, site not specified: Secondary | ICD-10-CM | POA: Diagnosis not present

## 2015-04-26 DIAGNOSIS — R309 Painful micturition, unspecified: Secondary | ICD-10-CM | POA: Diagnosis not present

## 2015-05-20 DIAGNOSIS — Z7901 Long term (current) use of anticoagulants: Secondary | ICD-10-CM | POA: Diagnosis not present

## 2015-05-20 DIAGNOSIS — I4891 Unspecified atrial fibrillation: Secondary | ICD-10-CM | POA: Diagnosis not present

## 2015-05-25 DIAGNOSIS — N3000 Acute cystitis without hematuria: Secondary | ICD-10-CM | POA: Diagnosis not present

## 2015-05-25 DIAGNOSIS — R300 Dysuria: Secondary | ICD-10-CM | POA: Diagnosis not present

## 2015-05-30 DIAGNOSIS — N390 Urinary tract infection, site not specified: Secondary | ICD-10-CM | POA: Diagnosis not present

## 2015-06-17 DIAGNOSIS — I482 Chronic atrial fibrillation: Secondary | ICD-10-CM | POA: Diagnosis not present

## 2015-06-17 DIAGNOSIS — Z7901 Long term (current) use of anticoagulants: Secondary | ICD-10-CM | POA: Diagnosis not present

## 2015-06-20 ENCOUNTER — Ambulatory Visit (INDEPENDENT_AMBULATORY_CARE_PROVIDER_SITE_OTHER): Payer: Medicare Other | Admitting: Family Medicine

## 2015-06-20 VITALS — BP 126/82 | HR 81 | Temp 97.6°F | Resp 16 | Ht 64.75 in | Wt 128.8 lb

## 2015-06-20 DIAGNOSIS — Z7901 Long term (current) use of anticoagulants: Secondary | ICD-10-CM

## 2015-06-20 DIAGNOSIS — K649 Unspecified hemorrhoids: Secondary | ICD-10-CM

## 2015-06-20 NOTE — Progress Notes (Signed)
Chief Complaint:  Chief Complaint  Patient presents with  . Hemorrhoids    Noticed a sharp pain during a bowel movement this morning. Pt. has rectal bleeding. Has not taken her Coumadin today.     HPI: Jamie Thompson is a 81 y.o. female who reports to [facility] today complaining of rectal bleeding x 1 epiosde this morning after wiping after BM. It was a lot per patient, more than just on her tissue. She has not had any since.  She lives at [facility], is on coumadin for atrial fibrillation which is currently in NSR. She had a colonscopy in 2006 with Dr Lajoyce Corners. She has a hx of colon poyps, diverticulosis and also internal hemorrhoids. She thinks she may have nicked the hemorrhoid with her fingernails when she wiped.  No fevers, chills, n/v/abd pain, blood in stool or urine prior to any of this. No CP or SOB or weakness.   Past Medical History  Diagnosis Date  . Hypertension   . Hypercholesteremia   . Atrial fibrillation   . Diverticulosis   . Meniere disease   . Anxiety disorder   . Hypothyroidism   . Adenomatous colon polyp 07/17/97  . Internal hemorrhoids   . Memory loss   . Chronic anticoagulation   . History of shingles   . Allergic rhinitis   . Allergy    Past Surgical History  Procedure Laterality Date  . Cataract extraction Right 2012   Social History   Social History  . Marital Status: Single    Spouse Name: N/A  . Number of Children: 2  . Years of Education: 74yr colg   Occupational History  . Retired    Social History Main Topics  . Smoking status: Never Smoker   . Smokeless tobacco: Never Used  . Alcohol Use: No  . Drug Use: No  . Sexual Activity: Not Asked   Other Topics Concern  . None   Social History Narrative   1 cup of coffee a day, occasionally drinks tea or soda   Family History  Problem Relation Age of Onset  . Stroke Father   . Clotting disorder Father   . Colon cancer Mother   . Colon cancer Sister   . Clotting disorder Sister     Allergies  Allergen Reactions  . Amitriptyline     Urinary Retention  . Erythromycin Hives  . Nitrofurantoin Nausea Only  . Sulfa Antibiotics     "really ill"  . Tavist [Clemastine]     Urinary hesitancy  . Trimethoprim     Cramps  . Verapamil     Constipation   Prior to Admission medications   Medication Sig Start Date End Date Taking? Authorizing Provider  atorvastatin (LIPITOR) 10 MG tablet Take 10 mg by mouth daily.   Yes Historical Provider, MD  calcium citrate (CALCITRATE - DOSED IN MG ELEMENTAL CALCIUM) 950 MG tablet Take 200 mg of elemental calcium by mouth daily.   Yes Historical Provider, MD  Calcium-Magnesium-Vitamin D 600-40-500 MG-MG-UNIT TB24 Take 1 tablet by mouth daily.   Yes Historical Provider, MD  carvedilol (COREG) 25 MG tablet Take 25 mg by mouth 2 (two) times daily with a meal.   Yes Historical Provider, MD  diazepam (VALIUM) 5 MG tablet Take 5 mg by mouth as needed.    Yes Historical Provider, MD  levothyroxine (SYNTHROID, LEVOTHROID) 50 MCG tablet Take 50 mcg by mouth daily.   Yes Historical Provider, MD  warfarin (COUMADIN)  2.5 MG tablet Take 2.5 mg by mouth daily. Tues, Wed, Thurs, Sat, Sun   Yes Historical Provider, MD  warfarin (COUMADIN) 5 MG tablet Take 5 mg by mouth daily. Monday and Friday   Yes Historical Provider, MD     ROS: The patient denies fevers, chills, night sweats, unintentional weight loss, chest pain, palpitations, wheezing, dyspnea on exertion, nausea, vomiting, abdominal pain, dysuria,numbness, weakness, or tingling.  All other systems have been reviewed and were otherwise negative with the exception of those mentioned in the HPI and as above.    PHYSICAL EXAM: Filed Vitals:   06/20/15 1551  BP: 126/82  Pulse: 81  Temp: 97.6 F (36.4 C)  Resp: 16   Body mass index is 21.59 kg/(m^2).   General: Alert, no acute distress HEENT:  Normocephalic, atraumatic, oropharynx patent. EOMI, PERRLA Cardiovascular:  Regular rate and  rhythm, no rubs murmurs or gallops.  Respiratory: Clear to auscultation bilaterally.  No wheezes, rales, or rhonchi.  No cyanosis, no use of accessory musculature Abdominal: No organomegaly, abdomen is soft and non-tender, positive bowel sounds. No masses. Skin: No rashes. Neurologic: Facial musculature symmetric. Psychiatric: Patient acts appropriately throughout our interaction. Lymphatic: No cervical or submandibular lymphadenopathy Musculoskeletal: Gait intact. No edema, tenderness External and internal hemorrhoid, + small abrasion oninternal hemorrhoid, no active bleeding No masses on rectal exam   LABS: No results found for this or any previous visit.   EKG/XRAY:   Primary read interpreted by Dr. Marin Comment at [facility].   ASSESSMENT/PLAN: Encounter Diagnoses  Name Primary?  . Hemorrhoids, unspecified hemorrhoid type Yes  . Chronic anticoagulation    Jamie Thompson is an 82 y/o female with a PMH of colon polyps, diverticulois, and also internal hemorrhoids, A fib on anticoagulation who presents with 1 episode fo rectal bleeding after she wiped hereself.  On exam her internal hemorrhoid has a small abrasion on it. She has been advised to dc her coumadin for today and tomorrow and see how she is does. She is in NSR currently.  Sitz baths after Bm, fiber and water, and also do not wipe aggressively.  Fu prn   Gross sideeffects, risk and benefits, and alternatives of medications d/w patient. Patient is aware that all medications have potential sideeffects and we are unable to predict every sideeffect or drug-drug interaction that may occur.  Thao Le DO  06/20/2015 5:00 PM   06/21/15-Patient has not had a rectal bleed since yesterday.

## 2015-06-20 NOTE — Patient Instructions (Signed)

## 2015-07-01 ENCOUNTER — Encounter: Payer: Self-pay | Admitting: Neurology

## 2015-07-01 ENCOUNTER — Ambulatory Visit (INDEPENDENT_AMBULATORY_CARE_PROVIDER_SITE_OTHER): Payer: Medicare Other | Admitting: Neurology

## 2015-07-01 VITALS — BP 152/70 | HR 70 | Resp 14 | Ht 65.0 in | Wt 125.0 lb

## 2015-07-01 DIAGNOSIS — H8101 Meniere's disease, right ear: Secondary | ICD-10-CM

## 2015-07-01 DIAGNOSIS — H9193 Unspecified hearing loss, bilateral: Secondary | ICD-10-CM | POA: Diagnosis not present

## 2015-07-01 DIAGNOSIS — R413 Other amnesia: Secondary | ICD-10-CM

## 2015-07-01 NOTE — Progress Notes (Signed)
Subjective:    Patient ID: Jamie Thompson is a 81 y.o. female.  HPI     Interim history:  [name] is an 81 year old right-handed woman with an underlying medical history of A. fib, hypertension, hyperlipidemia, Mnire's disease with bilateral hearing loss, anxiety, hypothyroidism, diverticulosis, status post right cataract extraction, allergic rhinitis, and vitamin D deficiency, who presents for follow-up consultation of her memory loss. The patient is accompanied by her son-in-law today. I first met her on 02/26/2015 at the request of her primary care physician, at which time she reported a several month history of short-term memory loss. Her MMSE was 24 out of 30, clock drawing was 3 out of 4, animal fluency was 9/m, depression score was 0 out of 15 at the time of her first visit with me. She had had a prior brain MRI several years ago and I suggested we proceed with another MRI to compare findings. I did not suggest any new medications and she recently had had blood work through her primary care physician. We talked about her driving and I asked her daughter to observe her driving. I suggested the patient stay on local roads, avoid highways and Interstates and avoid driving at night. She had a brain MRI without contrast on 03/09/2015: Ordinary age related volume loss. Minimal small vessel change of the cerebral hemispheric white matter, less than often seen in healthy individuals of this age. No acute or reversible finding. No specific cause of the presenting symptoms is identified. Since the previous study, atrophy is slightly progressive as would be expected.  Findings were compared to a previous brain MRI from 03/02/2005.  In addition, I personally reviewed the images through the PACS system.  We called her with her test results  Today, 07/01/2015: She reports doing well. Her son in law has observed her driving and was satisfied. She has been stable, has no new complaints, is mildly  forgetful, per Time and has a good appetite. She has not fallen.   Previously:   02/26/2015: She reports short-term memory issues for the past few months. She reported some forgetfulness, misplacing things, and forgetting conversations. She still drives. She drives locally for the most part. She has not had any issues driving but her daughter has not recently observed her driving. Per daughter, she has had a 6-9 month history of forgetfulness, misplacing things, forgetting events or conversations. Familiar faces are easily recognized. She has no evidence of paranoia, no other delusions, no delusions, no hallucinations, and no personality changes or behavioral changes. [facility]. She has 2 daughters, Jamie Thompson his local and her other daughter, Jamie Thompson, is in [place], [place]. She does not have a strong family history of dementia but a maternal aunt had Alzheimer's disease. Her mother lived to be 59 years old, her father died at the age of 36. She is a lifelong nonsmoker and does not currently drink any alcohol. She has never had any heavy alcohol use. She does not use illicit drugs. She sleeps fairly well. She has not had any confusion or disorientation. She tries to walk regularly. She goes to the dining room once a day for lunch. She eats fairly well. I reviewed your office note from 02/12/2015 which you kindly included. Recent blood work from 02/06/2015 showed normal liver function, cholesterol of 148, triglycerides of 57, LDL of 84. She had a brain MRI with and without contrast on 03/02/2005: 1.  No internal auditory canal enhancing lesion or acute infarct.  2.  Mild age related atrophy.  Mild nonspecific white matter changes may be related to sequela of small vessel disease. 3.  Cervical spondylotic changes with spinal stenosis C3-4 and C4-5 incompletely evaluated on the present exam.   In addition, I personally reviewed the images through the PACS system.   Her  Past Medical History Is Significant For: Past Medical History  Diagnosis Date  . Hypertension   . Hypercholesteremia   . Atrial fibrillation ([place])   . Diverticulosis   . Meniere disease   . Anxiety disorder   . Hypothyroidism   . Adenomatous colon polyp 07/17/97  . Internal hemorrhoids   . Memory loss   . Chronic anticoagulation   . History of shingles   . Allergic rhinitis   . Allergy     Her Past Surgical History Is Significant For: Past Surgical History  Procedure Laterality Date  . Cataract extraction Right 2012    Her Family History Is Significant For: Family History  Problem Relation Age of Onset  . Stroke Father   . Clotting disorder Father   . Colon cancer Mother   . Colon cancer Sister   . Clotting disorder Sister     Her Social History Is Significant For: Social History   Social History  . Marital Status: Single    Spouse Name: N/A  . Number of Children: 2  . Years of Education: 51yrcolg   Occupational History  . Retired    Social History Main Topics  . Smoking status: Never Smoker   . Smokeless tobacco: Never Used  . Alcohol Use: No  . Drug Use: No  . Sexual Activity: Not Asked   Other Topics Concern  . None   Social History Narrative   1 cup of coffee a day, occasionally drinks tea or soda    Her Allergies Are:  Allergies  Allergen Reactions  . Amitriptyline     Urinary Retention  . Erythromycin Hives  . Nitrofurantoin Nausea Only  . Sulfa Antibiotics     "really ill"  . Tavist [Clemastine]     Urinary hesitancy  . Trimethoprim     Cramps  . Verapamil     Constipation  :   Her Current Medications Are:  Outpatient Encounter Prescriptions as of 07/01/2015  Medication Sig  . atorvastatin (LIPITOR) 10 MG tablet Take 10 mg by mouth daily.  . calcium citrate (CALCITRATE - DOSED IN MG ELEMENTAL CALCIUM) 950 MG tablet Take 200 mg of elemental calcium by mouth daily.  . Calcium-Magnesium-Vitamin D 6539-76-734MG-MG-UNIT TB24 Take 1  tablet by mouth daily.  . carvedilol (COREG) 25 MG tablet Take 25 mg by mouth 2 (two) times daily with a meal.  . levothyroxine (SYNTHROID, LEVOTHROID) 50 MCG tablet Take 50 mcg by mouth daily.  .Marland Kitchenwarfarin (COUMADIN) 2.5 MG tablet Take 2.5 mg by mouth daily. Tues, Wed, Thurs, Sat, Sun  . warfarin (COUMADIN) 5 MG tablet Take 5 mg by mouth daily. Monday and Friday  . [DISCONTINUED] diazepam (VALIUM) 5 MG tablet Take 5 mg by mouth as needed.    No facility-administered encounter medications on file as of 07/01/2015.  :  Review of Systems:  Out of a complete 14 point review of systems, all are reviewed and negative with the exception of these symptoms as listed below:   Review of Systems  Neurological:       Patient is here to discuss MRI. Son-in-law is with patient. Reports that patient has "good days and and  bad days."    Objective:  Neurologic Exam  Physical Exam Physical Examination:   There were no vitals filed for this visit.   General Examination: The patient is a very pleasant 81 y.o. female in no acute distress. She is calm and cooperative with the exam. She denies Auditory Hallucinations and Visual Hallucinations. She is very well groomed and situated in a chair. She is in good spirits.   HEENT: Normocephalic, atraumatic, pupils are equal, round and reactive to light and accommodation. Extraocular tracking shows no saccadic breakdown without nystagmus noted. Hearing is impaired with bilateral hearing aids in place. Face is symmetric with no facial masking and normal facial sensation. There is no lip, neck or jaw tremor. Neck is not rigid with intact passive ROM. There are no carotid bruits on auscultation. Oropharynx exam reveals mild to moderate mouth dryness. No significant airway crowding is noted. Mallampati is class II. Tongue protrudes centrally and palate elevates symmetrically.    Chest: is clear to auscultation without wheezing, rhonchi or crackles noted.  Heart: sounds  are regular and normal without murmurs, rubs or gallops noted.   Abdomen: is soft, non-tender and non-distended with normal bowel sounds appreciated on auscultation.  Extremities: There is no pitting edema in the distal lower extremities bilaterally. Pedal pulses are intact.   Skin: is warm and dry with no trophic changes noted. Age-related changes are noted on the skin.   Musculoskeletal: exam reveals no obvious joint deformities, tenderness or joint swelling or erythema. Mild changes consistent with OA of the hands are noted bilaterally.   Neurologically:  Mental status: The patient is awake and alert, paying good  attention. She is able to provide a good portion of the history. Details are provided by Jamie Thompson. She is oriented to: person, place, time/date, situation, day of week, month of year and year. Her memory, attention, language and knowledge are impaired mildly. There is no aphasia, agnosia, apraxia or anomia. There is a mild degree of bradyphrenia. Speech is not hypophonic with no dysarthria noted. Mood is congruent and affect is normal.  On 02/26/2015: Her MMSE (Mini-Mental state exam) score is 24/30. CDT (Clock Drawing Test) score is 3/4. AFT (Animal Fluency Test) score is 9. Geriatric Depression Scale Score is 0.   On 07/01/2015: MMSE: 27/30, CDT: 3/4, AFT: 10/min.  Cranial nerves are as described above under HEENT exam. In addition, shoulder shrug is normal with equal shoulder height noted.  Motor exam: Normal bulk, and strength for age is noted. Tone is not rigid with absence of cogwheeling.  Findings motor skills are intact. There is no resting tremor or postural or action tremor. Finger to nose testing and heel-to-shin are normal bilaterally. Reflexes are 2+ in the upper extremities and 1+ in the lower extremity is. Toes are downgoing bilaterally.  Sensory exam is intact to light touch, pinprick, vibration, temperature sense and proprioception in the upper and lower extremities.    Gait, station and balance: She stands up from the seated position with no difficulty and needs no assistance. Posture is age-appropriate. Stance is slightly cautious and slow. Tandem walk is not possible because she feels insecure.  Assessment and Plan:   In summary, ISHIA TENORIO is a very pleasant 80 year old female with an underlying medical history of A. fib, hypertension, hyperlipidemia, Mnire's disease with bilateral hearing loss, anxiety, hypothyroidism, diverticulosis, status post right cataract extraction, allergic rhinitis, and vitamin D deficiency, who presents for follow-up consultation of her memory loss.  She has mild memory  loss, possibly in keeping with age-related memory loss versus mild cognitive impairment. I talked to patient and her daughter at length about her symptoms in my findings. We talked about memory loss and dementia, the prognosis and treatment options. We talked a brain MRI results from 03/09/2015 which showed age-related findings and mild progression of her atrophy from an MRI brain from 2006, which is understandable. Otherwise findings were reassuring and I explained this to the patient and her son-in-law today. Her memory scores have been stable or even a little better than last time. I would like to hold off on any memory medication at this timeand they are in agreement. We did talk about potential pharmacological intervention with dementia medication but at this point I would like to hold off on monitor her symptoms. Memory medications are not without potential side effects.  We talked about maintaining a healthy lifestyle in general and staying active mentally and physically. I encouraged the patient to eat healthy, exercise daily and keep well hydrated, to keep a scheduled bedtime and wake time routine, to not skip any meals and eat healthy snacks in between meals and to have protein with every meal. I stressed the importance of regular exercise, within of course  the patient's own mobility limitations. I also talked to them about her driving again today. Jamie Thompson does not have any particular concerns regarding her local driving skills. I again asked her to avoid Interstate driving, highway driving, nighttime driving and stay on local roads during daylight hours only.  At this juncture, I will see her back in about 6 months for recheck. I answered all their questions today and the patient and her son-in-law were in agreement.  I spent 25 minutes in total face-to-face time with the patient, more than 50% of which was spent in counseling and coordination of care, reviewing test results, reviewing medication and discussing or reviewing the diagnosis of memory loss, its prognosis and treatment options.

## 2015-07-01 NOTE — Patient Instructions (Addendum)
I think overall you are doing fairly well but I do want to suggest a few things today:  Remember to drink plenty of fluid, eat healthy meals and do not skip any meals. Try to eat protein with a every meal and eat a healthy snack such as fruit or nuts in between meals. Try to keep a regular sleep-wake schedule and try to exercise daily, particularly in the form of walking, 20 minutes a day, if you can. Good nutrition, proper sleep and exercise can help your cognitive function.  Engage in social activities in your community and with your family and try to keep up with current events by reading the newspaper or watching the news. If you have computer and can go online, try [email or web address]. Also, you may like to do word finding puzzles or crossword puzzles.  As far as your medications are concerned, I would like to suggest no new medications today.   I would like to see you back in 6 months, sooner if we need to. Please call us with any interim questions, concerns, problems, updates or refill requests.  Our phone number is [phone]. We also have an after hours call service for urgent matters and there is a physician on-call for urgent questions. For any emergencies you know to call 911 or go to the nearest emergency room.

## 2015-07-09 ENCOUNTER — Telehealth: Payer: Self-pay | Admitting: Internal Medicine

## 2015-07-09 DIAGNOSIS — K625 Hemorrhage of anus and rectum: Secondary | ICD-10-CM | POA: Diagnosis not present

## 2015-07-09 DIAGNOSIS — Z23 Encounter for immunization: Secondary | ICD-10-CM | POA: Diagnosis not present

## 2015-07-09 NOTE — Telephone Encounter (Signed)
Spoke with Fraser Din and scheduled patient on 07/19/15 with Alonza Bogus, PA . Pat to faxed records on patient.

## 2015-07-12 ENCOUNTER — Encounter: Payer: Self-pay | Admitting: Gastroenterology

## 2015-07-19 ENCOUNTER — Ambulatory Visit (INDEPENDENT_AMBULATORY_CARE_PROVIDER_SITE_OTHER): Payer: Medicare Other | Admitting: Gastroenterology

## 2015-07-19 ENCOUNTER — Encounter: Payer: Self-pay | Admitting: Gastroenterology

## 2015-07-19 VITALS — BP 124/74 | HR 70 | Ht 65.0 in | Wt 126.4 lb

## 2015-07-19 DIAGNOSIS — K648 Other hemorrhoids: Secondary | ICD-10-CM | POA: Diagnosis not present

## 2015-07-19 MED ORDER — HYDROCORTISONE 2.5 % RE CREA: 1.0000 "application " | TOPICAL_CREAM | Freq: Two times a day (BID) | RECTAL | Status: DC

## 2015-07-19 NOTE — Patient Instructions (Signed)
We sent a prescription to [facility] for the rectal cream.  Anusol HC hydrocortisone cream.

## 2015-07-19 NOTE — Progress Notes (Signed)
Agree with assessment and plan. She should follow up if symptoms persist

## 2015-07-19 NOTE — Progress Notes (Signed)
     07/19/2015 Jamie Thompson [id number] 09-19-1925   History of Present Illness: This is a pleasant 81 year old female who is previously known to Dr. Olevia Perches for one visit just 2 years ago. She has past medical history of atrial fibrillation for which she is on Coumadin, hypertension, hyperlipidemia, Mnire disease with hard of hearing and hearing aids, hypothyroidism, memory loss particularly short-term which is being evaluated, history of melanoma. She is here today with her son-in-law, Jamie Thompson, for evaluation of rectal bleeding. Due to the patient short-term memory loss it is hard to get accurate information and patient forgets events after about 15 minutes. Jamie Thompson states that on September 22 she had an episode of rectal bleeding that apparently turned the toilet water red. She does reside in independent living at [facility] and the nursing staff there did confirm this. They did an exam and said that she had hemorrhoids. She was then taken to the urgent care where she was seen and evaluated and found to have hemorrhoids, one with an abrasion. Was not treated with any type of topical medication, etc. She was then seen and followed by her PCP, Dr. Shelia Media ,on 10/11, and he sent her here for evaluation.  Hgb was normal at 12.7 grams at that time.  No further confirmed bleeding since that time.  Patient denies any complaints.  She has had 3 colonoscopies. The first one in 1998 showed a tubular adenoma. A subsequent colonoscopy in September 2001 showed a tortuous colon with internal hemorrhoids but no polyps. Her last colonoscopy was in November 2006 (by Dr. Lajoyce Corners) and again showed internal hemorrhoids and mild diverticulosis, but no polyps.  She does have family history of colon cancer in a sister who died at age 52 and also in her mother as well.     Current Medications, Allergies, Past Medical History, Past Surgical History, Family History and Social History were reviewed in [employer]  record.   Physical Exam: BP 124/74 mmHg  Pulse 70  Ht 5\' 5"  (1.651 m)  Wt 126 lb 6.4 oz (57.335 kg)  BMI 21.03 kg/m2 General: Well developed white female in no acute distress Head: Normocephalic and atraumatic Eyes:  Sclerae anicteric, conjunctiva pink  Ears: Normal auditory acuity Lungs: Clear throughout to auscultation Heart: Regular rate and rhythm Abdomen: Soft, non-distended.  Normal bowel sounds.  Non-tender. Rectal:  No external hemorrhoids.  Prolapsing internal hemorrhoid that has erosion/abrasion, but no active bleeding. Musculoskeletal: Symmetrical with no gross deformities  Extremities: No edema  Neurological: Alert oriented x 4, grossly non-focal Psychological:  Alert and cooperative. Normal mood and affect  Assessment and Recommendations: -81 year old female with one episode of rectal bleeding.  Last colonoscopy normal 10 years ago.  Internal hemorrhoid with erosion/abrasion seen on exam today c/w what was found at urgent care.  Will treat with hydrocortisone suppository/cream BID for 10 days.  Hgb is normal.

## 2015-08-15 DIAGNOSIS — Z7901 Long term (current) use of anticoagulants: Secondary | ICD-10-CM | POA: Diagnosis not present

## 2015-08-15 DIAGNOSIS — I482 Chronic atrial fibrillation: Secondary | ICD-10-CM | POA: Diagnosis not present

## 2015-09-09 DIAGNOSIS — Z0000 Encounter for general adult medical examination without abnormal findings: Secondary | ICD-10-CM | POA: Diagnosis not present

## 2015-09-09 DIAGNOSIS — N390 Urinary tract infection, site not specified: Secondary | ICD-10-CM | POA: Diagnosis not present

## 2015-09-09 DIAGNOSIS — M810 Age-related osteoporosis without current pathological fracture: Secondary | ICD-10-CM | POA: Diagnosis not present

## 2015-09-09 DIAGNOSIS — Z7901 Long term (current) use of anticoagulants: Secondary | ICD-10-CM | POA: Diagnosis not present

## 2015-09-09 DIAGNOSIS — E039 Hypothyroidism, unspecified: Secondary | ICD-10-CM | POA: Diagnosis not present

## 2015-09-09 DIAGNOSIS — E7800 Pure hypercholesterolemia, unspecified: Secondary | ICD-10-CM | POA: Diagnosis not present

## 2015-09-12 DIAGNOSIS — I4891 Unspecified atrial fibrillation: Secondary | ICD-10-CM | POA: Diagnosis not present

## 2015-09-12 DIAGNOSIS — Z7901 Long term (current) use of anticoagulants: Secondary | ICD-10-CM | POA: Diagnosis not present

## 2015-09-13 DIAGNOSIS — E039 Hypothyroidism, unspecified: Secondary | ICD-10-CM | POA: Diagnosis not present

## 2015-09-13 DIAGNOSIS — I10 Essential (primary) hypertension: Secondary | ICD-10-CM | POA: Diagnosis not present

## 2015-09-13 DIAGNOSIS — I482 Chronic atrial fibrillation: Secondary | ICD-10-CM | POA: Diagnosis not present

## 2015-09-13 DIAGNOSIS — F419 Anxiety disorder, unspecified: Secondary | ICD-10-CM | POA: Diagnosis not present

## 2015-09-25 DIAGNOSIS — I10 Essential (primary) hypertension: Secondary | ICD-10-CM | POA: Diagnosis not present

## 2015-10-01 DIAGNOSIS — I10 Essential (primary) hypertension: Secondary | ICD-10-CM | POA: Diagnosis not present

## 2015-10-03 DIAGNOSIS — Z7901 Long term (current) use of anticoagulants: Secondary | ICD-10-CM | POA: Diagnosis not present

## 2015-10-03 DIAGNOSIS — I10 Essential (primary) hypertension: Secondary | ICD-10-CM | POA: Diagnosis not present

## 2015-10-03 DIAGNOSIS — I4891 Unspecified atrial fibrillation: Secondary | ICD-10-CM | POA: Diagnosis not present

## 2015-10-08 DIAGNOSIS — I10 Essential (primary) hypertension: Secondary | ICD-10-CM | POA: Diagnosis not present

## 2015-10-17 DIAGNOSIS — I10 Essential (primary) hypertension: Secondary | ICD-10-CM | POA: Diagnosis not present

## 2015-10-17 DIAGNOSIS — Z7901 Long term (current) use of anticoagulants: Secondary | ICD-10-CM | POA: Diagnosis not present

## 2015-10-17 DIAGNOSIS — I4891 Unspecified atrial fibrillation: Secondary | ICD-10-CM | POA: Diagnosis not present

## 2015-10-29 DIAGNOSIS — R600 Localized edema: Secondary | ICD-10-CM | POA: Diagnosis not present

## 2015-10-29 DIAGNOSIS — I10 Essential (primary) hypertension: Secondary | ICD-10-CM | POA: Diagnosis not present

## 2015-10-30 ENCOUNTER — Ambulatory Visit (INDEPENDENT_AMBULATORY_CARE_PROVIDER_SITE_OTHER): Payer: Medicare Other | Admitting: Neurology

## 2015-10-30 ENCOUNTER — Encounter: Payer: Self-pay | Admitting: Neurology

## 2015-10-30 VITALS — BP 138/80 | HR 60 | Resp 14 | Ht 65.0 in | Wt 124.0 lb

## 2015-10-30 DIAGNOSIS — R413 Other amnesia: Secondary | ICD-10-CM | POA: Diagnosis not present

## 2015-10-30 NOTE — Patient Instructions (Signed)
We do a formal neuropsychological test (aka cognitive testing) for your memory complaints. This requires a referral to a trained and licensed neuropsychologist and will be a separate appointment at a different clinic. This will be a lengthy appointment.  Please stay better hydrated, do not skip any meals!

## 2015-10-30 NOTE — Progress Notes (Signed)
Subjective:    Patient ID: Jamie Thompson is a 82 y.o. female.  HPI     Interim history:   Jamie Thompson is an 82 year old right-handed woman with an underlying medical history of A. fib, hypertension, hyperlipidemia, Mnire's disease with bilateral hearing loss, anxiety, hypothyroidism, diverticulosis, status post right cataract extraction, allergic rhinitis, and vitamin D deficiency, who presents for follow-up consultation of her memory loss. The patient is accompanied by her son-in-law today. She presents for a sooner than scheduled appointment because of worsening memory loss. I last saw her on 07/01/2015, at which time she reported doing well. Her son-in-law had observed her driving and was satisfied with her driving. She was mildly forgetful, she had not fallen. She had a good appetite. Her MMSE was 27 out of 30 at the time, clock drawing was 3 out of 4, animal fluency 10/m. We mutually agreed to monitor her symptoms and not start her on medication for memory loss at the time.  Today, 10/30/2015: She reports feeling fine, her son in law would have not necessarily brought her for a sooner appointment, but her other daughter noticed more memory loss when recently visiting. She has had more forgetfulness, more irritable, sometimes skips meals, does not drink enough water, some confusion, no longer drives. She has been in independent living but there have been instances where the staff had to redirect her to her apartment. She had a near fall are controlled fall recently about 3 weeks ago. She saw her primary care physician recently and had blood work which per son-in-law's report was fine including urinalysis as I understand.  Previously:   I first met her on 02/26/2015 at the request of her primary care physician, at which time she reported a several month history of short-term memory loss. Her MMSE was 24 out of 30, clock drawing was 3 out of 4, animal fluency was 9/m, depression score was 0 out  of 15 at the time of her first visit with me. She had had a prior brain MRI several years ago and I suggested we proceed with another MRI to compare findings. I did not suggest any new medications and she recently had had blood work through her primary care physician. We talked about her driving and I asked her daughter to observe her driving. I suggested the patient stay on local roads, avoid highways and Interstates and avoid driving at night. She had a brain MRI without contrast on 03/09/2015: Ordinary age related volume loss. Minimal small vessel change of the cerebral hemispheric white matter, less than often seen in healthy individuals of this age. No acute or reversible finding. No specific cause of the presenting symptoms is identified. Since the previous study, atrophy is slightly progressive as would be expected.  Findings were compared to a previous brain MRI from 03/02/2005.  In addition, I personally reviewed the images through the PACS system.  We called her with her test results  02/26/2015: She reports short-term memory issues for the past few months. She reported some forgetfulness, misplacing things, and forgetting conversations. She still drives. She drives locally for the most part. She has not had any issues driving but her daughter has not recently observed her driving. Per daughter, she has had a 6-9 month history of forgetfulness, misplacing things, forgetting events or conversations. Familiar faces are easily recognized. She has no evidence of paranoia, no other delusions, no delusions, no hallucinations, and no personality changes or behavioral changes. [facility]  living. She has 2 daughters, Jamie Thompson his local and her other daughter, Jamie Thompson, is in [place], [place]. She does not have a strong family history of dementia but a maternal aunt had Alzheimer's disease. Her mother lived to be 55 years old, her father died at the age of 66. She is a lifelong  nonsmoker and does not currently drink any alcohol. She has never had any heavy alcohol use. She does not use illicit drugs. She sleeps fairly well. She has not had any confusion or disorientation. She tries to walk regularly. She goes to the dining room once a day for lunch. She eats fairly well. I reviewed your office note from 02/12/2015 which you kindly included. Recent blood work from 02/06/2015 showed normal liver function, cholesterol of 148, triglycerides of 57, LDL of 84. She had a brain MRI with and without contrast on 03/02/2005: 1.  No internal auditory canal enhancing lesion or acute infarct. 2.  Mild age related atrophy.  Mild nonspecific white matter changes may be related to sequela of small vessel disease. 3.  Cervical spondylotic changes with spinal stenosis C3-4 and C4-5 incompletely evaluated on the present exam.   In addition, I personally reviewed the images through the PACS system.  Her Past Medical History Is Significant For: Past Medical History  Diagnosis Date  . Hypertension   . Hypercholesteremia   . Atrial fibrillation ([place])   . Diverticulosis   . Meniere disease   . Anxiety disorder   . Hypothyroidism   . Adenomatous colon polyp 07/17/97  . Internal hemorrhoids   . Memory loss   . Chronic anticoagulation   . History of shingles   . Allergic rhinitis   . Allergy   . Melanoma ([place])   . Tension headache     Her Past Surgical History Is Significant For: Past Surgical History  Procedure Laterality Date  . Cataract extraction Right 2012    Her Family History Is Significant For: Family History  Problem Relation Age of Onset  . Stroke Father   . Clotting disorder Father   . Colon cancer Mother   . Colon cancer Sister   . Clotting disorder Sister     Her Social History Is Significant For: Social History   Social History  . Marital Status: Single    Spouse Name: N/A  . Number of Children: 2  . Years of Education: 41yrcolg   Occupational History   . Retired    Social History Main Topics  . Smoking status: Never Smoker   . Smokeless tobacco: Never Used  . Alcohol Use: No  . Drug Use: No  . Sexual Activity: Not Asked   Other Topics Concern  . None   Social History Narrative   1 cup of coffee a day, occasionally drinks tea or soda    Her Allergies Are:  Allergies  Allergen Reactions  . Amitriptyline     Urinary Retention  . Amlodipine Swelling  . Erythromycin Hives  . Nitrofurantoin Nausea Only  . Sulfa Antibiotics     "really ill"  . Tavist [Clemastine]     Urinary hesitancy  . Trimethoprim     Cramps  . Verapamil     Constipation  :   Her Current Medications Are:  Outpatient Encounter Prescriptions as of 10/30/2015  Medication Sig  . atorvastatin (LIPITOR) 10 MG tablet Take 10 mg by mouth daily.  . calcium citrate (CALCITRATE - DOSED IN MG ELEMENTAL CALCIUM) 950 MG tablet Take 200 mg of elemental  calcium by mouth daily.  . Calcium-Magnesium-Vitamin D [id number] MG-MG-UNIT TB24 Take 1 tablet by mouth daily.  . carvedilol (COREG) 25 MG tablet Take 25 mg by mouth 2 (two) times daily with a meal.  . levothyroxine (SYNTHROID, LEVOTHROID) 50 MCG tablet Take 50 mcg by mouth daily.  Marland Kitchen triamterene-hydrochlorothiazide (MAXZIDE-25) 37.5-25 MG tablet   . warfarin (COUMADIN) 2.5 MG tablet Take 2.5 mg by mouth daily. Tues, Thurs, Sat, Sun  . warfarin (COUMADIN) 5 MG tablet Take 5 mg by mouth daily. Monday, Wed, and Friday  . [DISCONTINUED] hydrocortisone (ANUSOL-HC) 2.5 % rectal cream Place 1 application rectally 2 (two) times daily. Use the cream rectally twice daily for 10 days.   No facility-administered encounter medications on file as of 10/30/2015.  :  Review of Systems:  Out of a complete 14 point review of systems, all are reviewed and negative with the exception of these symptoms as listed below:   Review of Systems  Neurological:       According to son-in-law, patient's memory loss has progressed. Her family has  taken her car keys from her so she is no longer driving.   Had f/u with PCP yesterday. Had blood work done about 3 weeks ago at [facility].     Objective:  Neurologic Exam  Physical Exam Physical Examination:   Filed Vitals:   10/30/15 1043  BP: 138/80  Pulse: 60  Resp: 14   General Examination: The patient is a very pleasant 82 y.o. female in no acute distress. She is calm and cooperative with the exam. She denies Auditory Hallucinations and Visual Hallucinations. She is very well groomed and situated in a chair. She is in good spirits today.  HEENT: Normocephalic, atraumatic, pupils are equal, round and reactive to light and accommodation. Extraocular tracking shows no saccadic breakdown without nystagmus noted. Hearing is impaired with bilateral hearing aids in place. Face is symmetric with no facial masking and normal facial sensation. There is no lip, neck or jaw tremor. Neck is not rigid with intact passive ROM. There are no carotid bruits on auscultation. Oropharynx exam reveals mild to moderate mouth dryness. No significant airway crowding is noted. Mallampati is class II. Tongue protrudes centrally and palate elevates symmetrically.    Chest: is clear to auscultation without wheezing, rhonchi or crackles noted.  Heart: sounds are regular and normal without murmurs, rubs or gallops noted.   Abdomen: is soft, non-tender and non-distended with normal bowel sounds appreciated on auscultation.  Extremities: There is no pitting edema in the distal lower extremities bilaterally. Pedal pulses are intact.   Skin: is warm and dry with no trophic changes noted. Age-related changes are noted on the skin.   Musculoskeletal: exam reveals no obvious joint deformities, tenderness or joint swelling or erythema. Mild changes consistent with OA of the hands are noted bilaterally.   Neurologically:  Mental status: The patient is awake and alert, paying good attention. She is able to provide  a portion of the history. Details are provided by Jamie Thompson, he does have a tendency to talk for her. She is oriented to: person, place, time/date, situation, day of week, month of year and year. Her memory, attention, language and knowledge are impaired mildly. There is no aphasia, agnosia, apraxia or anomia. There is a mild degree of bradyphrenia. Speech is not hypophonic with no dysarthria noted. Mood is congruent and affect is normal.   On 02/26/2015: Her MMSE (Mini-Mental state exam) score is 24/30. CDT (Clock Drawing Test) score is  3/4. AFT (Animal Fluency Test) score is 9. Geriatric Depression Scale Score is 0.   On 07/01/2015: MMSE: 27/30, CDT: 3/4, AFT: 10/min.  On 10/30/2015: MMSE: 29/30, CDT: 4/4, AFT: 12/min.  Cranial nerves are as described above under HEENT exam. In addition, shoulder shrug is normal with equal shoulder height noted.  Motor exam: Normal bulk, and strength for age is noted. Tone is not rigid with absence of cogwheeling.  Findings motor skills are intact. There is no resting tremor or postural or action tremor. Finger to nose testing and heel-to-shin are normal bilaterally. Reflexes are 2+ in the upper extremities and 1+ in the lower extremity is.   Sensory exam is intact to light touch in the upper and lower extremities.   Gait, station and balance: She stands up from the seated position with no difficulty and needs no assistance. Posture is age-appropriate. Stance is slightly cautious and slow. Tandem walk is not possible.  Assessment and Plan:   In summary, Jamie Thompson is a very pleasant 82 year old female with an underlying medical history of A. fib, hypertension, hyperlipidemia, Mnire's disease with bilateral hearing loss, anxiety, hypothyroidism, diverticulosis, status post right cataract extraction, allergic rhinitis, and vitamin D deficiency, who presents for follow-up consultation of her memory loss, for a sooner appointment because family has noted more memory  issues. She is currently at [facility], independent living, but her son in law and family has been in communication with [facility] with possibly the next steps, as in transitioning her to perhaps assisted living. They would like to keep her independent as much is possible and as long as possible. Her memory scores have been stable, even slightly better today than last time. Her physical exam is stable. She has mild memory loss, this could be age-related versus mild cognitive impairment. I talked at length with the patient and her son-in-law again today. She has previously had a brain MRI on 03/09/2015, which showed age-related findings and mild for progression of her atrophy as compared to an MRI brain from 2006, which is understandable, after 10 years. I suggested we hold off on any new medications at this time, she is advised to not skip any meals and stay better hydrated with water. She may not to break with this every day. I suggested we proceed with formal cognitive testing in the form of neuropsychological consultation. She is agreeable, I made a referral in that regard. I will see her back after that. I agree that she is probably better off not to drive any longer. I answered all their questions today and the patient and her son-in-law were in agreement.  I spent 25 minutes in total face-to-face time with the patient, more than 50% of which was spent in counseling and coordination of care, reviewing test results, reviewing medication and discussing or reviewing the diagnosis of memory loss, its prognosis and treatment options.

## 2015-11-14 DIAGNOSIS — I10 Essential (primary) hypertension: Secondary | ICD-10-CM | POA: Diagnosis not present

## 2015-11-14 DIAGNOSIS — Z7901 Long term (current) use of anticoagulants: Secondary | ICD-10-CM | POA: Diagnosis not present

## 2015-11-14 DIAGNOSIS — I4891 Unspecified atrial fibrillation: Secondary | ICD-10-CM | POA: Diagnosis not present

## 2015-12-16 DIAGNOSIS — Z7901 Long term (current) use of anticoagulants: Secondary | ICD-10-CM | POA: Diagnosis not present

## 2015-12-16 DIAGNOSIS — I4891 Unspecified atrial fibrillation: Secondary | ICD-10-CM | POA: Diagnosis not present

## 2015-12-31 ENCOUNTER — Ambulatory Visit: Payer: Medicare Other | Admitting: Neurology

## 2016-01-13 DIAGNOSIS — I4891 Unspecified atrial fibrillation: Secondary | ICD-10-CM | POA: Diagnosis not present

## 2016-01-13 DIAGNOSIS — Z7901 Long term (current) use of anticoagulants: Secondary | ICD-10-CM | POA: Diagnosis not present

## 2016-01-27 ENCOUNTER — Ambulatory Visit: Payer: Medicare Other | Attending: Psychology | Admitting: Psychology

## 2016-01-27 ENCOUNTER — Encounter: Payer: Self-pay | Admitting: Psychology

## 2016-01-27 DIAGNOSIS — G3184 Mild cognitive impairment, so stated: Secondary | ICD-10-CM | POA: Insufficient documentation

## 2016-01-27 NOTE — Progress Notes (Signed)
[facility]  [address]   Telephone [phone] [address] Fax [phone] [place], [place] [address]  Initial Contact Note  Name:  SAMIJO GOVERN Date of Birth; [birth year] MRN:  [id number] Date:  01/27/2016  Jamie Thompson is an 82 y.o. female who was referred for neuropsychological evaluation by [name], MD due to an approximate two year history of progressive memory loss.   Preliminary Findings: Ms. Hoole is a relatively high functioning woman with sole complaints of memory loss and occasional sensations of imbalance while walking ("the floor feels like I'm walking on cotton"). Residents at her independent living center have expressed to family concerns about her memory though staff has not. 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 Her good verbal and social skills may make her appear more cognitively intact than she is. She would be unlikely to adequately function in unfamiliar or non-routine situations. Her neuropsychological profile would be most predictive of incipient Alzheimer's disease. I will be meeting with Ms. Venier and her family on 02/05/16 to review the evaluation results.  There were no concerns expressed or behaviors displayed by Bernadene Bell that would require immediate attention.    A total of 5 hours was spent today reviewing medical records, interviewing (CPT [phone]) Bernadene Bell, her daughter and son-in-law, administering and scoring neurocognitive tests, and preparing a written report (CPT [id number] & [phone]).  A full report will follow once the feedback session has occurred.    Jamey Ripa,  Ph.D Licensed Psychologist 01/27/2016

## 2016-01-28 ENCOUNTER — Ambulatory Visit (INDEPENDENT_AMBULATORY_CARE_PROVIDER_SITE_OTHER): Payer: Medicare Other | Admitting: Neurology

## 2016-01-28 ENCOUNTER — Encounter: Payer: Self-pay | Admitting: Neurology

## 2016-01-28 VITALS — BP 128/72 | HR 70 | Resp 16 | Ht 65.0 in | Wt 129.0 lb

## 2016-01-28 DIAGNOSIS — G3184 Mild cognitive impairment, so stated: Secondary | ICD-10-CM

## 2016-01-28 DIAGNOSIS — H9193 Unspecified hearing loss, bilateral: Secondary | ICD-10-CM | POA: Diagnosis not present

## 2016-01-28 MED ORDER — DONEPEZIL HCL 5 MG PO TABS: 5.0000 mg | ORAL_TABLET | Freq: Every day | ORAL | Status: DC

## 2016-01-28 NOTE — Patient Instructions (Addendum)
I would like for you to start a new medication for memory loss: Aricept (generic name: donepezil) 5 mg: take one pill each evening. Common side effects may include dry eyes, dry mouth, confusion, low pulse, low blood pressure, also GI related side effects (nausea, vomiting, diarrhea, constipation), headaches; rare side effects may include hallucinations and seizures.   You had recent neuropsychological evaluation with Dr. Valentina Shaggy on 01/27/16 and while the full report is to follow, his preliminary findings indicate that you have problems with your memory, executive function, naming and visual spatial skills. On the positive side, you have good verbal and good social skills. You have a follow-up appointment with Dr. Valentina Shaggy pending for 02/05/2016 to review the full results. Please keep the appointment. We will see you back in 2 months with one of your nurse practitioners, and I will see you back after that.

## 2016-01-28 NOTE — Progress Notes (Signed)
Subjective:    Patient ID: Jamie Thompson is a 82 y.o. female.  HPI     Interim history:   Jamie Thompson is an 82 year old right-handed woman with an underlying medical history of A. fib, hypertension, hyperlipidemia, Mnire's disease with bilateral hearing loss, anxiety, hypothyroidism, diverticulosis, status post right cataract extraction, allergic rhinitis, and vitamin D deficiency, who presents for follow-up consultation of her memory loss. The patient is accompanied by her son-in-law today. I last saw her on 10/30/2015 at which time she reported for a sooner than scheduled appointment because of worsening memory issues noted by her other daughter who had recently visited the patient. She had more forgetfulness, lumbar mood irritability, does not always drinking enough water. She was living in independent living but there have been some instances where the staff had to redirect her to her own apartment. She had a near fall or controlled fall rather about 3 weeks prior. She had seen her primary care physician and had blood work which per son-in-law was fine including urinalysis. Her MMSE was 29/30, CDT: 4/4, AFT: 12/min. I suggested we proceed with formal neuropsychological testing. She had this evaluation with Dr. Valentina Thompson on 01/27/2016.   Today, 01/28/2016: She reports feeling about the same. She has no new complaints. Her son-in-law reports that she has had more confusion. She is more forgetful. She is not on Coreg as I understand. She takes half of Maxzide from what I can tell. Her son-in-law's not completely sure about her medications. She has an appointment with her primary care physician next month. He feels that she does not always drink enough water. They try to remind her. Appetite is good. She has not fallen, thankfully. I reviewed the preliminary neuropsychological test results from 01/27/2016: On neuropsychological testing she demonstrated serious impairment of memory and executive function  as well as deficiencies for confrontational naming and constructional skills are considered with her level of functioning would be consistent with a diagnosis of mild cognitive impairment-amnestic type, multiple domains. Her good verbal and social skills may make her appear more cognitively intact than she is. She would be unlikely to adequately function and unfamiliar are non-routine situations. Her neuropsychological profile would be most predictive of incipient Alzheimer's disease.  She has a follow-up appointment with Dr. Valentina Thompson pending for 02/05/2016.  Previously:   She presents for a sooner than scheduled appointment because of worsening memory loss. I last saw her on 07/01/2015, at which time she reported doing well. Her son-in-law had observed her driving and was satisfied with her driving. She was mildly forgetful, she had not fallen. She had a good appetite. Her MMSE was 27 out of 30 at the time, clock drawing was 3 out of 4, animal fluency 10/m. We mutually agreed to monitor her symptoms and not start her on medication for memory loss at the time.  I first met her on 02/26/2015 at the request of her primary care physician, at which time she reported a several month history of short-term memory loss. Her MMSE was 24 out of 30, clock drawing was 3 out of 4, animal fluency was 9/m, depression score was 0 out of 15 at the time of her first visit with me. She had had a prior brain MRI several years ago and I suggested we proceed with another MRI to compare findings. I did not suggest any new medications and she recently had had blood work through her primary care physician. We talked about her driving and I asked her daughter  to observe her driving. I suggested the patient stay on local roads, avoid highways and Interstates and avoid driving at night. She had a brain MRI without contrast on 03/09/2015: Ordinary age related volume loss. Minimal small vessel change of the cerebral hemispheric white  matter, less than often seen in healthy individuals of this age. No acute or reversible finding. No specific cause of the presenting symptoms is identified. Since the previous study, atrophy is slightly progressive as would be expected.  Findings were compared to a previous brain MRI from 03/02/2005. In addition, I personally reviewed the images through the PACS system. We called her with her test results  02/26/2015: She reports short-term memory issues for the past few months. She reported some forgetfulness, misplacing things, and forgetting conversations. She still drives. She drives locally for the most part. She has not had any issues driving but her daughter has not recently observed her driving. Per daughter, she has had a 6-9 month history of forgetfulness, misplacing things, forgetting events or conversations. Familiar faces are easily recognized. She has no evidence of paranoia, no other delusions, no delusions, no hallucinations, and no personality changes or behavioral changes. She lives at friend's home in independent living. She has 2 daughters, Jamie Thompson his local and her other daughter, Jamie Thompson, is in [place], [place]. She does not have a strong family history of dementia but a maternal aunt had Alzheimer's disease. Her mother lived to be 42 years old, her father died at the age of 10. She is a lifelong nonsmoker and does not currently drink any alcohol. She has never had any heavy alcohol use. She does not use illicit drugs. She sleeps fairly well. She has not had any confusion or disorientation. She tries to walk regularly. She goes to the dining room once a day for lunch. She eats fairly well. I reviewed your office note from 02/12/2015 which you kindly included. Recent blood work from 02/06/2015 showed normal liver function, cholesterol of 148, triglycerides of 57, LDL of 84. She had a brain MRI with and without contrast on 03/02/2005: 1.  No internal auditory canal enhancing lesion or  acute infarct. 2.  Mild age related atrophy.  Mild nonspecific white matter changes may be related to sequela of small vessel disease. 3.  Cervical spondylotic changes with spinal stenosis C3-4 and C4-5 incompletely evaluated on the present exam.   In addition, I personally reviewed the images through the PACS system.  Her Past Medical History Is Significant For: Past Medical History  Diagnosis Date  . Hypertension   . Hypercholesteremia   . Atrial fibrillation ([place])   . Diverticulosis   . Meniere disease   . Anxiety disorder   . Hypothyroidism   . Adenomatous colon polyp 07/17/97  . Internal hemorrhoids   . Memory loss   . Chronic anticoagulation   . History of shingles   . Allergic rhinitis   . Allergy   . Melanoma ([place])   . Tension headache     Her Past Surgical History Is Significant For: Past Surgical History  Procedure Laterality Date  . Cataract extraction Right 2012    Her Family History Is Significant For: Family History  Problem Relation Age of Onset  . Stroke Father   . Clotting disorder Father   . Colon cancer Mother   . Colon cancer Sister   . Clotting disorder Sister     Her Social History Is Significant For: Social History   Social History  . Marital Status: Single  Spouse Name: N/A  . Number of Children: 2  . Years of Education: 50yrcolg   Occupational History  . Retired    Social History Main Topics  . Smoking status: Never Smoker   . Smokeless tobacco: Never Used  . Alcohol Use: No  . Drug Use: No  . Sexual Activity: Not Asked   Other Topics Concern  . None   Social History Narrative   1 cup of coffee a day, occasionally drinks tea or soda    Her Allergies Are:  Allergies  Allergen Reactions  . Amitriptyline     Urinary Retention  . Amlodipine Swelling  . Erythromycin Hives  . Nitrofurantoin Nausea Only  . Sulfa Antibiotics     "really ill"  . Tavist [Clemastine]     Urinary hesitancy  . Trimethoprim     Cramps  .  Verapamil     Constipation  :   Her Current Medications Are:  Outpatient Encounter Prescriptions as of 01/28/2016  Medication Sig  . atorvastatin (LIPITOR) 10 MG tablet Take 10 mg by mouth daily.  . calcium citrate (CALCITRATE - DOSED IN MG ELEMENTAL CALCIUM) 950 MG tablet Take 200 mg of elemental calcium by mouth daily.  . Calcium-Magnesium-Vitamin D 6161-09-604MG-MG-UNIT TB24 Take 1 tablet by mouth daily.  . carvedilol (COREG) 25 MG tablet Take 25 mg by mouth 2 (two) times daily with a meal.  . levothyroxine (SYNTHROID, LEVOTHROID) 50 MCG tablet Take 50 mcg by mouth daily.  .Marland Kitchentriamterene-hydrochlorothiazide (MAXZIDE-25) 37.5-25 MG tablet   . warfarin (COUMADIN) 2.5 MG tablet Take 2.5 mg by mouth daily. Tues, Thurs, Sat, Sun  . warfarin (COUMADIN) 5 MG tablet Take 5 mg by mouth daily. Monday, Wed, and Friday  . donepezil (ARICEPT) 5 MG tablet Take 1 tablet (5 mg total) by mouth at bedtime.   No facility-administered encounter medications on file as of 01/28/2016.  :  Review of Systems:  Out of a complete 14 point review of systems, all are reviewed and negative with the exception of these symptoms as listed below:   Review of Systems  Neurological:       Patient is here for f/u. She had neuropsychiatric testing done yesterday. No new concerns.     Objective:  Neurologic Exam  Physical Exam Physical Examination:   Filed Vitals:   01/28/16 1115  BP: 128/72  Pulse: 70  Resp: 16   General Examination: The patient is a very pleasant 82y.o. female in no acute distress. She is calm and cooperative with the exam. She denies Auditory Hallucinations and Visual Hallucinations. She is very well groomed and situated in a chair. She is in good spirits today.  HEENT: Normocephalic, atraumatic, pupils are equal, round and reactive to light and accommodation. Extraocular tracking shows no saccadic breakdown without nystagmus noted. Hearing is impaired with bilateral hearing aids in place. Face  is symmetric with no facial masking and normal facial sensation. There is no lip, neck or jaw tremor. Neck is not rigid with intact passive ROM. There are no carotid bruits on auscultation. Oropharynx exam reveals mild mouth dryness. No significant airway crowding is noted. Mallampati is class II. Tongue protrudes centrally and palate elevates symmetrically.    Chest: is clear to auscultation without wheezing, rhonchi or crackles noted.  Heart: sounds are regular and normal without murmurs, rubs or gallops noted.   Abdomen: is soft, non-tender and non-distended with normal bowel sounds appreciated on auscultation.  Extremities: There is no pitting edema in  the distal lower extremities bilaterally. Pedal pulses are intact.   Skin: is warm and dry with no trophic changes noted. Age-related changes are noted on the skin, skin turgor mildly reduced.   Musculoskeletal: exam reveals no obvious joint deformities, tenderness or joint swelling or erythema. Mild changes consistent with OA of the hands are noted bilaterally.   Neurologically:  Mental status: The patient is awake and alert, paying good attention. She is able to provide a portion of the history. Details are provided by Tim, he does have a tendency to talk for her. She is oriented to: person, place, time/date, situation, day of week, month of year and year. Her memory, attention, language and knowledge are impaired mildly. There is no aphasia, agnosia, apraxia or anomia. There is a mild degree of bradyphrenia. Speech is not hypophonic with no dysarthria noted. Mood is congruent and affect is normal.   On 02/26/2015: Her MMSE (Mini-Mental state exam) score is 24/30. CDT (Clock Drawing Test) score is 3/4. AFT (Animal Fluency Test) score is 9. Geriatric Depression Scale Score is 0.   On 07/01/2015: MMSE: 27/30, CDT: 3/4, AFT: 10/min.  On 10/30/2015: MMSE: 29/30, CDT: 4/4, AFT: 12/min.  Cranial nerves are as described above under HEENT exam. In  addition, shoulder shrug is normal with equal shoulder height noted.  Motor exam: Normal bulk, and strength for age is noted. Tone is not rigid with absence of cogwheeling.  Findings motor skills are intact. There is no resting tremor or postural or action tremor. Finger to nose testing and heel-to-shin are normal bilaterally. Reflexes are 2+ in the upper extremities and 1+ in the lower extremities.   Sensory exam is intact to light touch in the upper and lower extremities.   Gait, station and balance: She stands up from the seated position with no difficulty and needs no assistance. Posture is age-appropriate. Stance is slightly cautious and slow. Tandem walk is not possible.  Assessment and Plan:   In summary, MCKENNAH KRETCHMER is a very pleasant 82 year old female with an underlying medical history of A. Fib on coumadin, hypertension, hyperlipidemia, Mnire's disease with bilateral hearing loss and b/l hearing aids in place, anxiety, hypothyroidism, diverticulosis, status post right cataract extraction, allergic rhinitis, and vitamin D deficiency, who presents for follow-up consultation of her memory loss. She had recent neuropsychological testing on 01/27/2016 and while her full report is pending, her preliminary results were reviewed and indicate findings in keeping with mild cognitive impairment, concern for early Alzheimer's disease. She resides at friend's home, independent living with plans to transition to assisted living. She has a good support network. She does not drive.Her family wants to help her stay as independent as possible. Memory scores have been stable and brain MRI from 03/09/2015 showed age-related findings and mild progression of her atrophy compared to an MRI from 2006. Today, I suggested we proceed with starting a new medication for memory, namely Aricept generic, 5 mg strength once daily. I talked to her and her son-in-law at length about this and she is also advised to keep her  appointment in follow-up with Dr. Valentina Thompson next week to discuss her cognitive test results. I talked her about the new medication and expectations, and potential side effects. She had multiple questions which I answered. Her son-in-law also had multiple questions which I answered today. I would like to have her come in for a follow-up in a couple of months, primarily to see how she is tolerating the new medication and to  potentially increase the Tapazole to 10 mg once daily at that time. I will see her back after that in about 6 months from now.  She is advised to stay active physically, not to skip any meals and stay better hydrated with water.  I answered all their questions today and the patient and her son-in-law were in agreement.  I spent 40 minutes in total face-to-face time with the patient, more than 50% of which was spent in counseling and coordination of care, reviewing test results, reviewing medication and discussing or reviewing the diagnosis of memory loss, its prognosis and treatment options.

## 2016-01-30 DIAGNOSIS — I482 Chronic atrial fibrillation: Secondary | ICD-10-CM | POA: Diagnosis not present

## 2016-01-30 DIAGNOSIS — Z7901 Long term (current) use of anticoagulants: Secondary | ICD-10-CM | POA: Diagnosis not present

## 2016-02-03 ENCOUNTER — Telehealth: Payer: Self-pay | Admitting: Neurology

## 2016-02-03 NOTE — Telephone Encounter (Signed)
I spoke to patient and she is aware of advice below and voiced understanding.

## 2016-02-03 NOTE — Telephone Encounter (Signed)
Patient is calling and states the Rx donepezil 5 mg tablets and states it is giving her diarrhea.  Please call before 2:00 if possible.

## 2016-02-03 NOTE — Telephone Encounter (Signed)
Please ask patient to stop the medication, donepezil. Once the diarrhea has improved, we can consider another medication. Please ask patient to give [place] an update next week.

## 2016-02-03 NOTE — Telephone Encounter (Signed)
Patient reports that since starting Donepezil she has had diarrhea and "seems to get worse everyday". She has to get up several times in the night and is afraid to leave her room in case of an accident. Jamie Thompson states that she feels like she cannot take this medication anymore. She sees Dr. Valentina Shaggy 5/10. What do you advise?

## 2016-02-05 ENCOUNTER — Ambulatory Visit (INDEPENDENT_AMBULATORY_CARE_PROVIDER_SITE_OTHER): Payer: Medicare Other | Admitting: Psychology

## 2016-02-05 DIAGNOSIS — G3184 Mild cognitive impairment, so stated: Secondary | ICD-10-CM

## 2016-02-10 ENCOUNTER — Encounter: Payer: Self-pay | Admitting: Psychology

## 2016-02-10 NOTE — Progress Notes (Signed)
[facility]  [address]   Telephone [phone] [address] Fax [phone] [place], [place] [address]   [place]* This report should not be released without the consent of the client  Name:   Jamie Thompson Date of Birth:  06-24-2025 [facility] MR#:  [id number] Date of Evaluation: 01/27/16  Reason for Referral Jamie Thompson is an 82 year old left-handed woman who was referred for neuropsychological evaluation by [name], MD of [facility]. According to family reports, she has displayed progressive memory difficulties over the past two years. A brain MRI scan on 03/09/15 showed age-related volume loss and minimal small vessel changes of the cerebral hemispheric white matter.  Sources of Information Electronic medical records from the [facility] were reviewed. Jamie Thompson, her daughter, Jamie Thompson, and her son-in-law, Mr. eBay, were interviewed.   Chief Complaints & Current Status Jamie Thompson acknowledged some concern about her memory as she has been aware of lately being prone to forget what she is doing after having been distracted in some way. Otherwise, she stated her belief that she functions without difficulty within an independent living center. Her only other complaint was having experienced occasional sensations of imbalance while walking ("the floor feels like I'm walking on cotton"). She did not cite any problems related to eye-hand coordination, limb strength, pain, sleep, daytime alertness, vision, swallowing, speech, appetite, smell or taste. She stated that she feels satisfied with her life. She has been enjoying socializing with and helping other residents. She denied experiencing mood disturbance, confusion, hallucinations or delusions.     Her daughter and son-in-law agreed that Jamie Thompson began to exhibit a progressive decline in her memory about two years ago. They convinced  her to stop driving in January [id number] after several episodes in which she could not find her car keys or locate her car in a parking lot. They offered recent examples of her not recalling recent events or conversations, repeating the same question, being unable to find personal items, exhibiting a shorter attention span and occasionally needing to be guided back to her to her apartment. They have noticed subtle emotional changes within the past year of her being more easily irritated or moved to tears, both brief in duration. She has seemed less interested in joining group conversations of late though has not exhibited social withdrawal. Usually pleasant and in good spirits, she has sometimes become argumentative when confronted with her memory errors. She has not exhibited wandering, bizarre behavior, poor judgment, loss of social comportment or unsafe behavior.   There was no report of any changes in her health, medication usage, mood, level of life stress or social situation coincident with the onset of her memory decline.  Background  She has been living in an independent living apartment at [facility] for almost three years. She was divorced in 26. She has two daughters.  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   She reported that she earned a Dietitian in Management consultant from the [employer].  She did not report any history of school-based problems with attention or learning.  Her past medical history was notable for allergic rhinitis, atrial fibrillation, hyperlipidemia, hypertension, hypothyroidism and Meniere's disease. She also has bilateral hearing loss with use of hearing aids.  She did not report history of unusual childhood illnesses or developmental delays.  She reported no history of head injury, loss of consciousness, seizure activity, stroke-like symptoms or exposure to toxic chemicals.     She reported rare alcohol use without past history of abuse. She reported that she has never used illicit drugs or tobacco products.   She reported no history of serious emotional difficulties, use of psychiatric medications or mental health contacts.  Her current medications include atorvastatin, carvedilol, levothyroxine, triamterene-hydrochlorothiazide and warfarin.  Family history was notable for a maternal aunt who possibly had Alzheimer's disease.  Observations She appeared as an appropriately dressed and groomed woman in no apparent distress. She interacted in a pleasant and cooperative manner. She spoke in a normal tone of voice, maintained good eye contact and responded to all questions. She had difficulty hearing. No problems were evident for speech articulation, prosody, word finding, word selection, message coherence or language comprehension. Her affect appeared within a wide range and without lability. She did not display signs of emotional distress. Her thought processes were coherent and organized without loose associations, verbal perseverations or flight of ideas. She had trouble recalling details of recent events. Her thought content was devoid of unusual or bizarre ideas.  Evaluation Procedures In addition to review of medical records and interviews, the following tests or questionnaires were administered:  Animal Naming Test  [facility] Naming Test Controlled Oral Word Association Test Geriatric Depression Scale (short form) Modified [employer] Figure: Copy Trail Making A & B Wechsler Adult Intelligence Scale-IV: Music therapist, Coding, Digit Span & Similarities  Wechsler Memory Scale-IV: Older Adult Battery Wide Range Achievement Test-4: Word Reading  Assessment Results Test Validity & Interpretive Considerations She did not display signs of physical distress. She did not report or display problems with vision (she wore her eyeglasses),  hearing (she wore her hearing aids) or motor skills. For the most part she appeared to sustain attention and persist to task. She was prone to either quickly forget verbal task instructions or become confused by cognitive challenge. She needed encouragement not to give up easily. She did refuse to guess as encouraged on a word recognition memory task and declined to continue working on a problem-solving task after getting stumped. Overall, it was concluded that the test results represented a valid measure of her current cognitive functioning.  Her pre-morbid intellectual potential was estimated to fall within the Average range based on her educational background coupled with a measure of word reading (Wide Range Achievement Test-4).  Her test scores were corrected to reflect norms for her age and, whenever possible, her gender and educational level (i.e., 16 years). A listing of test results can be found at the end of this report.  Speed of Processing & Attention Her performance on tests of processing speed was variable. Her speed to transcribe symbols to match digits using a key (Wechsler Adult Intelligence Scale-IV (WAIS-IV) Coding) fell within the Average range. In contrast, her speed to draw lines to connect randomly arrayed numbers in sequence (Trails A) was abnormally slow.   Her attentional capacity to encode, hold and manipulate information in temporary memory was also variable. She scored within the Average range on tests that required her to mentally rearrange digits in reverse order (WAIS-IV Digit Span: Backward) or immediately recognize symbols in left to right order (Wechsler Memory Scale-IV (WMS-IV) Symbol Span). In contrast, her ability to mentally re-sequence digits in ascending order (WAIS-IV Digit Span: Sequencing) was within the Borderline range.   Learning & Memory A composite measure of her  ability to immediately recall verbal and visual information (WMS-IV Immediate Memory Index)  fell within the Borderline range at the 4th percentile. A composite measure of her delayed recall of verbal and visual information (WMS-IV Delayed Memory Index) was significantly lower as it fell within the impaired range at below the 1st percentile. After an approximate twenty minute delay interval, she was unable to recall any words from stories read to her or reproduce any aspects of figural designs. Her ability to recognize this information from multiple choices exceeded her free delayed recall, which indicated that she had difficulty retrieving information from memory storage.  Language Her performance on a measure of confrontational naming [facility] [employer]) was within the Borderline range. For example, she gave incorrect names (albeit within the same category) for drawings of a canoe, beaver, harmonica and igloo. Her phonemic fluency (Controlled Oral Word Association Test) was within the Average range. Her ability to read words (Wide Range Achievement Test-4: Word Reading) fell within the Average range.  Visual-Spatial Perception & Organization There were no signs of spatial inattention or problems with visual recognition. Her performance on a test of visuospatial organization that required assembly of two-dimensional block designs from models (WAIS-IV Block Design) fell within the Average range. Her copy of a spatially complex geometric figure (Rey Complex Figure) was borderline impaired due to faulty organization and perseveration of one design feature.    Executive Function She was unable to complete a complex visual sequencing test that required mental tracking and set shifting (Trails B). This test was discontinued after multiple errors and an excessive amount of time. Measures of fluent verbal production were mixed. Her ability to generate members of a category (Animal Naming Test) fell within the Borderline range whereas her ability to generate words to designated letters (Controlled Oral  Word Association Test) was within the Average range. Her ability to form and express abstract verbal concepts (WAIS-IV Similarities) was somewhat lower than expected within the Low Average range. As noted above, she refused to continue on a problem-solving task (Modified [employer]) after inferring two matching principles but then making multiple errors while trying to deduce the third sorting idea.   Emotional Status She did not endorse any items (score= 0/15) on the Geriatric Depression Scale (short form).     Summary & Conclusions Jamie Thompson is an 82 year-old woman who according to family members has exhibited a progressive decline in memory over the past two years ago. In addition, she has displayed subtle emotional changes within the past year of being more easily irritated and moved to tears. There was no report of serious mood or behavioral disturbance. She continues to function at a relatively high level within an independent living center. For her part, Jamie Thompson reported minor memory decline as well as occasional sensations of imbalance while walking ("the floor feels like I'm walking on cotton").   Neuropsychological testing indicated subnormal performances on measures of memory (both immediate and delayed recall), mental tracking/set shifting, visual sequencing speed, confrontational naming, semantic fluency, constructional praxis and novel problem-solving. Observations of this alert and pleasant woman during test-taking were notable for her propensity to either quickly forget task instructions or become confused by cognitive challenge and subsequently give up easily. There were no indications of emotional or behavioral disturbance.  Her neuropsychological profile considered in light of her relatively high level of daily functioning would be consistent with diagnosis of Mild Cognitive Impairment-amnestic type, multiple domains. Family report, however, suggested that her  cognitive  decline has accelerated within the past few months so she might actually be transitioning to mild dementia, possibly of the Alzheimer's Type. At this time, her good verbal and social skills may make her appear more cognitively intact than she is. She would be unlikely to adequately function in unfamiliar or non-routine situations.   Diagnostic Impression Mild Cognitive impairment, amnestic type, multiple domains [G31.84]  Recommendations 1. Assuming continued cognitive decline, it may be necessary for her to move into assisted living within the coming year. Family should have regular contact with staff at her independent living center to help make this decision.   2. Given her memory loss, she requires oversight of medication administration and bill paying. Her son-in-law stated that he is available to do this on a daily basis.  3. It is recommended that she undergo a repeat neuropsychological evaluation in about one year using these test results as a baseline to track for interval changes, if any, in her cognitive functioning.   The conclusions from this evaluation were discussed with Jamie Thompson and her son-in-law on 02/05/16.    I have appreciated the opportunity to evaluate Jamie Thompson. Please feel free to contact me with any comments or questions.    ______________________ Jamey Ripa, Ph.D Licensed Psychologist      Copy to:  Jamie Thompson (with Jamie Thompson's consent)                ADDENDUM-NEUROPSYCHOLOGICAL TEST RESULTS Animal Naming Test Score= 11   4th (adjusted for age, gender and educational level)   Financial trader Score= 38/60   4th (adjusted for age, gender and educational level)   Controlled Oral Word Association Test Score= 37 words/0 repetitions 28th (adjusted for age, gender and educational level)   Modified [employer] Test she refused to continue after getting two categories correct  Rey Complex Figure: copy    Score= 23/36 6th - 10th signs of perseveration and disorganization    Trails A Score= 102  2e    <1st (adjusted for age, gender and educational level)  Trails B Score= N/A      discontinued after 240s and 4e        Wechsler Adult Intelligence Scale-IV   Subtest Scaled Score Percentile  Block Design  11   63rd     Similarities    7   16th       Digit Span   Forward   Backward   Sequencing    9   14    8    5    37th      91st     25th        5th       Coding      9   37th       Wechsler Memory Scale-IV Older Adult Battery Index Index Score Percentile  Immediate Memory  73   4th        Auditory Memory  65   1st       Visual Memory  69   2nd       Delayed Memory  56 <1st        Symbol Span subtest Scaled score= 10 50th         Wide Range Achievement Test-4 Subtest  Raw score Standard score Percentile  Word Reading 62/70      97 42nd

## 2016-02-13 DIAGNOSIS — Z7901 Long term (current) use of anticoagulants: Secondary | ICD-10-CM | POA: Diagnosis not present

## 2016-02-13 DIAGNOSIS — I4891 Unspecified atrial fibrillation: Secondary | ICD-10-CM | POA: Diagnosis not present

## 2016-02-17 DIAGNOSIS — R413 Other amnesia: Secondary | ICD-10-CM | POA: Diagnosis not present

## 2016-02-17 DIAGNOSIS — I4891 Unspecified atrial fibrillation: Secondary | ICD-10-CM | POA: Diagnosis not present

## 2016-02-17 DIAGNOSIS — Z7901 Long term (current) use of anticoagulants: Secondary | ICD-10-CM | POA: Diagnosis not present

## 2016-03-02 DIAGNOSIS — Z7901 Long term (current) use of anticoagulants: Secondary | ICD-10-CM | POA: Diagnosis not present

## 2016-03-02 DIAGNOSIS — I4891 Unspecified atrial fibrillation: Secondary | ICD-10-CM | POA: Diagnosis not present

## 2016-03-04 DIAGNOSIS — I4891 Unspecified atrial fibrillation: Secondary | ICD-10-CM | POA: Diagnosis not present

## 2016-03-18 DIAGNOSIS — L600 Ingrowing nail: Secondary | ICD-10-CM | POA: Diagnosis not present

## 2016-04-07 ENCOUNTER — Encounter: Payer: Self-pay | Admitting: Adult Health

## 2016-04-07 ENCOUNTER — Ambulatory Visit (INDEPENDENT_AMBULATORY_CARE_PROVIDER_SITE_OTHER): Payer: Medicare Other | Admitting: Adult Health

## 2016-04-07 VITALS — BP 124/70 | HR 86 | Resp 20 | Ht 65.0 in | Wt 126.0 lb

## 2016-04-07 DIAGNOSIS — G3184 Mild cognitive impairment, so stated: Secondary | ICD-10-CM

## 2016-04-07 MED ORDER — DONEPEZIL HCL 10 MG PO TABS: 10.0000 mg | ORAL_TABLET | Freq: Every day | ORAL | Status: DC

## 2016-04-07 NOTE — Patient Instructions (Signed)
Increase Aricept 10 mg daily at bedtime Memory score is stable If your symptoms worsen or you develop new symptoms please let us know.   MMSE - Mini Mental State Exam 04/07/2016 10/30/2015 07/01/2015  Orientation to time 4 5 5   Orientation to Place 5 5 4   Registration 3 3 3   Attention/ Calculation 5 5 5   Attention/Calculation-comments refused numbers - -  Recall 2 2 1   Language- name 2 objects 2 2 2   Language- repeat 1 1 1   Language- follow 3 step command 3 3 3   Language- read & follow direction 1 1 1   Write a sentence 1 1 1   Copy design 0 1 1  Total score 27 29 27

## 2016-04-07 NOTE — Progress Notes (Addendum)
PATIENT: Jamie Thompson DOB: October 01, 1924  REASON FOR VISIT: follow up- memory HISTORY FROM: patient/ son of law  HISTORY OF PRESENT ILLNESS: Today 04/07/2016: [name] is an 82 year old female with a history of mild cognitive impairment. She returns today for follow-up. The patient is currently on Aricept 5 mg daily. It was reported that she has diarrhea on the medication and she was asked to discontinue it. However her son-in-law is with her and he reports that this was not true. He states the patient has remained on Aricept and is doing quite well. She lives at [facility] in independent living. She is able to do most ADLs independently. She does not operate a motor vehicle. Her son in law manages her medications for her. She is sleeping well at night. Denies any hallucinations or vivid dreams. She returns today for an evaluation.  HISTORY (ATHAR):[name] is an 82 year old right-handed woman with an underlying medical history of A. fib, hypertension, hyperlipidemia, Mnire's disease with bilateral hearing loss, anxiety, hypothyroidism, diverticulosis, status post right cataract extraction, allergic rhinitis, and vitamin D deficiency, who presents for follow-up consultation of her memory loss. The patient is accompanied by her son-in-law today. I last saw her on 10/30/2015 at which time she reported for a sooner than scheduled appointment because of worsening memory issues noted by her other daughter who had recently visited the patient. She had more forgetfulness, lumbar mood irritability, does not always drinking enough water. She was living in independent living but there have been some instances where the staff had to redirect her to her own apartment. She had a near fall or controlled fall rather about 3 weeks prior. She had seen her primary care physician and had blood work which per son-in-law was fine including urinalysis. Her MMSE was 29/30, CDT: 4/4, AFT: 12/min. I suggested we  proceed with formal neuropsychological testing. She had this evaluation with Dr. Valentina Shaggy on 01/27/2016.   Today, 01/28/2016: She reports feeling about the same. She has no new complaints. Her son-in-law reports that she has had more confusion. She is more forgetful. She is not on Coreg as I understand. She takes half of Maxzide from what I can tell. Her son-in-law's not completely sure about her medications. She has an appointment with her primary care physician next month. He feels that she does not always drink enough water. They try to remind her. Appetite is good. She has not fallen, thankfully. I reviewed the preliminary neuropsychological test results from 01/27/2016: On neuropsychological testing she demonstrated serious impairment of memory and executive function as well as deficiencies for confrontational naming and constructional skills are considered with her level of functioning would be consistent with a diagnosis of mild cognitive impairment-amnestic type, multiple domains. Her good verbal and social skills may make her appear more cognitively intact than she is. She would be unlikely to adequately function and unfamiliar are non-routine situations. Her neuropsychological profile would be most predictive of incipient Alzheimer's disease.  She has a follow-up appointment with Dr. Valentina Shaggy pending for 02/05/2016.  Previously:   She presents for a sooner than scheduled appointment because of worsening memory loss. I last saw her on 07/01/2015, at which time she reported doing well. Her son-in-law had observed her driving and was satisfied with her driving. She was mildly forgetful, she had not fallen. She had a good appetite. Her MMSE was 27 out of 30 at the time, clock drawing was 3 out of 4, animal fluency 10/m. We mutually agreed to  monitor her symptoms and not start her on medication for memory loss at the time.  I first met her on 02/26/2015 at the request of her primary care physician, at  which time she reported a several month history of short-term memory loss. Her MMSE was 24 out of 30, clock drawing was 3 out of 4, animal fluency was 9/m, depression score was 0 out of 15 at the time of her first visit with me. She had had a prior brain MRI several years ago and I suggested we proceed with another MRI to compare findings. I did not suggest any new medications and she recently had had blood work through her primary care physician. We talked about her driving and I asked her daughter to observe her driving. I suggested the patient stay on local roads, avoid highways and Interstates and avoid driving at night. She had a brain MRI without contrast on 03/09/2015: Ordinary age related volume loss. Minimal small vessel change of the cerebral hemispheric white matter, less than often seen in healthy individuals of this age. No acute or reversible finding. No specific cause of the presenting symptoms is identified. Since the previous study, atrophy is slightly progressive as would be expected.  Findings were compared to a previous brain MRI from 03/02/2005. In addition, I personally reviewed the images through the PACS system. We called her with her test results  02/26/2015: She reports short-term memory issues for the past few months. She reported some forgetfulness, misplacing things, and forgetting conversations. She still drives. She drives locally for the most part. She has not had any issues driving but her daughter has not recently observed her driving. Per daughter, she has had a 6-9 month history of forgetfulness, misplacing things, forgetting events or conversations. Familiar faces are easily recognized. She has no evidence of paranoia, no other delusions, no delusions, no hallucinations, and no personality changes or behavioral changes. [facility]. She has 2 daughters, Jamie Thompson his local and her other daughter, Jamie Thompson, is in [place], [place]. She does  not have a strong family history of dementia but a maternal aunt had Alzheimer's disease. Her mother lived to be 25 years old, her father died at the age of 29. She is a lifelong nonsmoker and does not currently drink any alcohol. She has never had any heavy alcohol use. She does not use illicit drugs. She sleeps fairly well. She has not had any confusion or disorientation. She tries to walk regularly. She goes to the dining room once a day for lunch. She eats fairly well. I reviewed your office note from 02/12/2015 which you kindly included. Recent blood work from 02/06/2015 showed normal liver function, cholesterol of 148, triglycerides of 57, LDL of 84. She had a brain MRI with and without contrast on 03/02/2005: 1. No internal auditory canal enhancing lesion or acute infarct. 2. Mild age related atrophy. Mild nonspecific white matter changes may be related to sequela of small vessel disease. 3. Cervical spondylotic changes with spinal stenosis C3-4 and C4-5 incompletely evaluated on the present exam.  In addition, I personally reviewed the images through the PACS system.  REVIEW OF SYSTEMS: Out of a complete 14 system review of symptoms, the patient complains only of the following symptoms, and all other reviewed systems are negative.  Ringing in ears, memory loss,  ALLERGIES: Allergies  Allergen Reactions  . Amitriptyline     Urinary Retention  . Amlodipine Swelling  . Erythromycin Hives  . Nitrofurantoin  Nausea Only  . Sulfa Antibiotics     "really ill"  . Tavist [Clemastine]     Urinary hesitancy  . Trimethoprim     Cramps  . Verapamil     Constipation    HOME MEDICATIONS: Outpatient Prescriptions Prior to Visit  Medication Sig Dispense Refill  . atorvastatin (LIPITOR) 10 MG tablet Take 10 mg by mouth daily.    . carvedilol (COREG) 25 MG tablet Take 25 mg by mouth 2 (two) times daily with a meal.    . donepezil (ARICEPT) 5 MG tablet Take 1 tablet (5 mg total) by mouth at  bedtime. 30 tablet 5  . levothyroxine (SYNTHROID, LEVOTHROID) 50 MCG tablet Take 50 mcg by mouth daily.    Marland Kitchen triamterene-hydrochlorothiazide (MAXZIDE-25) 37.5-25 MG tablet     . calcium citrate (CALCITRATE - DOSED IN MG ELEMENTAL CALCIUM) 950 MG tablet Take 200 mg of elemental calcium by mouth daily.    . Calcium-Magnesium-Vitamin D [id number] MG-MG-UNIT TB24 Take 1 tablet by mouth daily.    Marland Kitchen warfarin (COUMADIN) 2.5 MG tablet Take 2.5 mg by mouth daily. Tues, Thurs, Sat, Sun    . warfarin (COUMADIN) 5 MG tablet Take 5 mg by mouth daily. Monday, Wed, and Friday     No facility-administered medications prior to visit.    PAST MEDICAL HISTORY: Past Medical History  Diagnosis Date  . Hypertension   . Hypercholesteremia   . Atrial fibrillation ([place])   . Diverticulosis   . Meniere disease   . Anxiety disorder   . Hypothyroidism   . Adenomatous colon polyp 07/17/97  . Internal hemorrhoids   . Memory loss   . Chronic anticoagulation   . History of shingles   . Allergic rhinitis   . Allergy   . Melanoma ([place])   . Tension headache     PAST SURGICAL HISTORY: Past Surgical History  Procedure Laterality Date  . Cataract extraction Right 2012    FAMILY HISTORY: Family History  Problem Relation Age of Onset  . Stroke Father   . Clotting disorder Father   . Colon cancer Mother   . Colon cancer Sister   . Clotting disorder Sister     SOCIAL HISTORY: Social History   Social History  . Marital Status: Single    Spouse Name: N/A  . Number of Children: 2  . Years of Education: 95yrcolg   Occupational History  . Retired    Social History Main Topics  . Smoking status: Never Smoker   . Smokeless tobacco: Never Used  . Alcohol Use: No  . Drug Use: No  . Sexual Activity: Not on file   Other Topics Concern  . Not on file   Social History Narrative   1 cup of coffee a day, occasionally drinks tea or soda      PHYSICAL EXAM  Filed Vitals:   04/07/16 0757  BP:  124/70  Pulse: 86  Resp: 20  Height: '5\' 5"'  (1.651 m)  Weight: 126 lb (57.153 kg)   Body mass index is 20.97 kg/(m^2).   MMSE - Mini Mental State Exam 04/07/2016 10/30/2015 07/01/2015  Orientation to time '4 5 5  ' Orientation to Place '5 5 4  ' Registration '3 3 3  ' Attention/ Calculation '5 5 5  ' Attention/Calculation-comments refused numbers - -  Recall '2 2 1  ' Language- name 2 objects '2 2 2  ' Language- repeat '1 1 1  ' Language- follow 3 step command '3 3 3  ' Language- read & follow direction  '1 1 1  ' Write a sentence '1 1 1  ' Copy design 0 1 1  Total score '27 29 27      ' Generalized: Well developed, in no acute distress   Neurological examination  Mentation: Alert. Follows all commands speech and language fluent Cranial nerve II-XII: Pupils were equal round reactive to light. Extraocular movements were full, visual field were full on confrontational test. Facial sensation and strength were normal. Uvula tongue midline. Head turning and shoulder shrug  were normal and symmetric. Motor: The motor testing reveals 5 over 5 strength of all 4 extremities. Good symmetric motor tone is noted throughout.  Sensory: Sensory testing is intact to soft touch on all 4 extremities. No evidence of extinction is noted.  Coordination: Cerebellar testing reveals good finger-nose-finger and heel-to-shin bilaterally.  Gait and station: Gait is normal. Tandem gait is unsteady. Romberg is negative. No drift is seen.  Reflexes: Deep tendon reflexes are symmetric and normal bilaterally.   DIAGNOSTIC DATA (LABS, IMAGING, TESTING) - I reviewed patient records, labs, notes, testing and imaging myself where available.     ASSESSMENT AND PLAN 82 y.o. year old female  has a past medical history of Hypertension; Hypercholesteremia; Atrial fibrillation ([place]); Diverticulosis; Meniere disease; Anxiety disorder; Hypothyroidism; Adenomatous colon polyp (07/17/97); Internal hemorrhoids; Memory loss; Chronic anticoagulation; History  of shingles; Allergic rhinitis; Allergy; Melanoma ([place]); and Tension headache. here with:  1. Mild cognitive impairment  Overall the patient is doing well. Her memory score has remained stable. We will increase Aricept to 10 mg daily. I advised that if she begins to have side effects such as diarrhea patient should let us know. She will follow-up in November with Dr. Rexene Alberts.   Ward Givens, MSN, NP-C 04/07/2016, 8:11 AM [facility] [address] [place], [place] [address] [phone]   I reviewed the above note and documentation by the Nurse Practitioner and agree with the history, physical exam, assessment and plan as outlined above. I was immediately available for face-to-face consultation. [name], MD, PhD [facility])

## 2016-04-20 ENCOUNTER — Telehealth: Payer: Self-pay | Admitting: *Deleted

## 2016-04-20 NOTE — Telephone Encounter (Signed)
Spoke to son, tim.   I explained that I received fax with question of what dose pt was to be on.  He did not call pharmacy.  I told him that from last visit, pt to increase to 10mg  po bedtime.  He picked up a bottle which was 5 mg tabs.   He will finish this and then next bottle will be 10mg  tabs.   I called [facility] and spoke to Nixon, she deactivated the prescription that was 5mg  tabs as so the next prescritpion will be 10mg  tabs.  Tim, understood the plan.

## 2016-04-20 NOTE — Telephone Encounter (Signed)
I called and LM for tim, that clarifying dose of donepezil, last note with MM/NP to increase to 10mg .  Request from [place] husband asking for 5 mg?  Please call to discuss.

## 2016-06-01 ENCOUNTER — Inpatient Hospital Stay (HOSPITAL_COMMUNITY)
Admission: EM | Admit: 2016-06-01 | Discharge: 2016-06-03 | DRG: 690 | Disposition: A | Discharge status: Nursing Home (Includes ICF) | Payer: Medicare Other | Attending: Internal Medicine | Admitting: Internal Medicine

## 2016-06-01 ENCOUNTER — Emergency Department (HOSPITAL_COMMUNITY): Payer: Medicare Other

## 2016-06-01 ENCOUNTER — Encounter (HOSPITAL_COMMUNITY): Payer: Self-pay

## 2016-06-01 DIAGNOSIS — I10 Essential (primary) hypertension: Secondary | ICD-10-CM | POA: Diagnosis present

## 2016-06-01 DIAGNOSIS — I447 Left bundle-branch block, unspecified: Secondary | ICD-10-CM | POA: Diagnosis present

## 2016-06-01 DIAGNOSIS — R0602 Shortness of breath: Secondary | ICD-10-CM

## 2016-06-01 DIAGNOSIS — R739 Hyperglycemia, unspecified: Secondary | ICD-10-CM | POA: Diagnosis present

## 2016-06-01 DIAGNOSIS — E871 Hypo-osmolality and hyponatremia: Secondary | ICD-10-CM | POA: Diagnosis not present

## 2016-06-01 DIAGNOSIS — Z881 Allergy status to other antibiotic agents status: Secondary | ICD-10-CM

## 2016-06-01 DIAGNOSIS — R680 Hypothermia, not associated with low environmental temperature: Secondary | ICD-10-CM | POA: Diagnosis present

## 2016-06-01 DIAGNOSIS — E785 Hyperlipidemia, unspecified: Secondary | ICD-10-CM | POA: Diagnosis present

## 2016-06-01 DIAGNOSIS — R079 Chest pain, unspecified: Secondary | ICD-10-CM | POA: Diagnosis not present

## 2016-06-01 DIAGNOSIS — F0390 Unspecified dementia without behavioral disturbance: Secondary | ICD-10-CM | POA: Diagnosis not present

## 2016-06-01 DIAGNOSIS — Z823 Family history of stroke: Secondary | ICD-10-CM

## 2016-06-01 DIAGNOSIS — N390 Urinary tract infection, site not specified: Principal | ICD-10-CM | POA: Diagnosis present

## 2016-06-01 DIAGNOSIS — Z7901 Long term (current) use of anticoagulants: Secondary | ICD-10-CM

## 2016-06-01 DIAGNOSIS — Z832 Family history of diseases of the blood and blood-forming organs and certain disorders involving the immune mechanism: Secondary | ICD-10-CM

## 2016-06-01 DIAGNOSIS — N179 Acute kidney failure, unspecified: Secondary | ICD-10-CM | POA: Diagnosis not present

## 2016-06-01 DIAGNOSIS — T68XXXA Hypothermia, initial encounter: Secondary | ICD-10-CM | POA: Diagnosis not present

## 2016-06-01 DIAGNOSIS — R61 Generalized hyperhidrosis: Secondary | ICD-10-CM

## 2016-06-01 DIAGNOSIS — I482 Chronic atrial fibrillation: Secondary | ICD-10-CM | POA: Diagnosis not present

## 2016-06-01 DIAGNOSIS — J309 Allergic rhinitis, unspecified: Secondary | ICD-10-CM | POA: Diagnosis present

## 2016-06-01 DIAGNOSIS — I480 Paroxysmal atrial fibrillation: Secondary | ICD-10-CM | POA: Diagnosis not present

## 2016-06-01 DIAGNOSIS — Z888 Allergy status to other drugs, medicaments and biological substances status: Secondary | ICD-10-CM

## 2016-06-01 DIAGNOSIS — Z79899 Other long term (current) drug therapy: Secondary | ICD-10-CM

## 2016-06-01 DIAGNOSIS — R001 Bradycardia, unspecified: Secondary | ICD-10-CM | POA: Diagnosis not present

## 2016-06-01 DIAGNOSIS — Z800 Family history of malignant neoplasm of digestive organs: Secondary | ICD-10-CM

## 2016-06-01 DIAGNOSIS — E039 Hypothyroidism, unspecified: Secondary | ICD-10-CM | POA: Diagnosis present

## 2016-06-01 DIAGNOSIS — F419 Anxiety disorder, unspecified: Secondary | ICD-10-CM | POA: Diagnosis present

## 2016-06-01 DIAGNOSIS — C439 Malignant melanoma of skin, unspecified: Secondary | ICD-10-CM | POA: Diagnosis present

## 2016-06-01 DIAGNOSIS — I4891 Unspecified atrial fibrillation: Secondary | ICD-10-CM

## 2016-06-01 DIAGNOSIS — Z66 Do not resuscitate: Secondary | ICD-10-CM | POA: Diagnosis present

## 2016-06-01 DIAGNOSIS — B9620 Unspecified Escherichia coli [E. coli] as the cause of diseases classified elsewhere: Secondary | ICD-10-CM | POA: Diagnosis present

## 2016-06-01 LAB — URINALYSIS, ROUTINE W REFLEX MICROSCOPIC
BILIRUBIN URINE: NEGATIVE
Glucose, UA: NEGATIVE mg/dL
Ketones, ur: NEGATIVE mg/dL
Nitrite: POSITIVE — AB
Protein, ur: NEGATIVE mg/dL
Specific Gravity, Urine: 1.026 (ref 1.005–1.030)
pH: 5 (ref 5.0–8.0)

## 2016-06-01 LAB — CBC WITH DIFFERENTIAL/PLATELET
BASOS ABS: 0 10*3/uL (ref 0.0–0.1)
Basophils Relative: 0 %
Eosinophils Absolute: 0.1 10*3/uL (ref 0.0–0.7)
Eosinophils Relative: 2 %
HEMATOCRIT: 37.3 % (ref 36.0–46.0)
Hemoglobin: 12 g/dL (ref 12.0–15.0)
LYMPHS ABS: 1.2 10*3/uL (ref 0.7–4.0)
LYMPHS PCT: 17 %
MCH: 30.1 pg (ref 26.0–34.0)
MCHC: 32.2 g/dL (ref 30.0–36.0)
MCV: 93.5 fL (ref 78.0–100.0)
MONO ABS: 0.5 10*3/uL (ref 0.1–1.0)
Monocytes Relative: 7 %
NEUTROS ABS: 5.2 10*3/uL (ref 1.7–7.7)
Neutrophils Relative %: 74 %
Platelets: 209 10*3/uL (ref 150–400)
RBC: 3.99 MIL/uL (ref 3.87–5.11)
RDW: 12.8 % (ref 11.5–15.5)
WBC: 7 10*3/uL (ref 4.0–10.5)

## 2016-06-01 LAB — TSH: TSH: 4.412 u[IU]/mL (ref 0.350–4.500)

## 2016-06-01 LAB — BASIC METABOLIC PANEL
Anion gap: 8 (ref 5–15)
BUN: 24 mg/dL — ABNORMAL HIGH (ref 6–20)
CHLORIDE: 102 mmol/L (ref 101–111)
CO2: 23 mmol/L (ref 22–32)
CREATININE: 1.04 mg/dL — AB (ref 0.44–1.00)
Calcium: 9.2 mg/dL (ref 8.9–10.3)
GFR calc non Af Amer: 46 mL/min — ABNORMAL LOW (ref >60)
GFR, EST AFRICAN AMERICAN: 54 mL/min — AB (ref >60)
GLUCOSE: 172 mg/dL — AB (ref 65–99)
Potassium: 3.4 mmol/L — ABNORMAL LOW (ref 3.5–5.1)
Sodium: 133 mmol/L — ABNORMAL LOW (ref 135–145)

## 2016-06-01 LAB — BRAIN NATRIURETIC PEPTIDE: B Natriuretic Peptide: 81.1 pg/mL (ref 0.0–100.0)

## 2016-06-01 LAB — I-STAT TROPONIN, ED: Troponin i, poc: 0 ng/mL (ref 0.00–0.08)

## 2016-06-01 LAB — I-STAT CG4 LACTIC ACID, ED: Lactic Acid, Venous: 1.36 mmol/L (ref 0.5–1.9)

## 2016-06-01 LAB — URINE MICROSCOPIC-ADD ON

## 2016-06-01 LAB — PROTIME-INR
INR: 2.79
Prothrombin Time: 30 seconds — ABNORMAL HIGH (ref 11.4–15.2)

## 2016-06-01 LAB — TROPONIN I
Troponin I: <0.03 ng/mL (ref <0.03)
Troponin I: <0.03 ng/mL (ref <0.03)

## 2016-06-01 LAB — POC OCCULT BLOOD, ED: FECAL OCCULT BLD: NEGATIVE

## 2016-06-01 MED ORDER — BISACODYL 5 MG PO TBEC: 5.0000 mg | DELAYED_RELEASE_TABLET | Freq: Every day | ORAL | Status: DC | PRN

## 2016-06-01 MED ORDER — SODIUM CHLORIDE 0.9 % IV SOLN
INTRAVENOUS | Status: DC
  Administered 2016-06-01 – 2016-06-02 (×3): via INTRAVENOUS

## 2016-06-01 MED ORDER — ACETAMINOPHEN 650 MG RE SUPP: 650.0000 mg | Freq: Four times a day (QID) | RECTAL | Status: DC | PRN

## 2016-06-01 MED ORDER — LEVOTHYROXINE SODIUM 50 MCG PO TABS
50.0000 ug | ORAL_TABLET | Freq: Every day | ORAL | Status: DC
  Administered 2016-06-02 – 2016-06-03 (×2): 50 ug via ORAL
  Filled 2016-06-01 (×2): qty 1

## 2016-06-01 MED ORDER — DONEPEZIL HCL 5 MG PO TABS
10.0000 mg | ORAL_TABLET | Freq: Every day | ORAL | Status: DC
  Administered 2016-06-02: 10 mg via ORAL
  Filled 2016-06-01: qty 2

## 2016-06-01 MED ORDER — ONDANSETRON HCL 4 MG PO TABS: 4.0000 mg | ORAL_TABLET | Freq: Four times a day (QID) | ORAL | Status: DC | PRN

## 2016-06-01 MED ORDER — POLYETHYLENE GLYCOL 3350 17 G PO PACK: 17.0000 g | PACK | Freq: Every day | ORAL | Status: DC | PRN

## 2016-06-01 MED ORDER — TRAZODONE HCL 50 MG PO TABS: 25.0000 mg | ORAL_TABLET | Freq: Every evening | ORAL | Status: DC | PRN

## 2016-06-01 MED ORDER — ASPIRIN 81 MG PO CHEW: 324.0000 mg | CHEWABLE_TABLET | Freq: Once | ORAL | Status: DC

## 2016-06-01 MED ORDER — DEXTROSE 5 % IV SOLN
1.0000 g | INTRAVENOUS | Status: DC
  Administered 2016-06-01 – 2016-06-02 (×2): 1 g via INTRAVENOUS
  Filled 2016-06-01 (×3): qty 10

## 2016-06-01 MED ORDER — ACETAMINOPHEN 325 MG PO TABS
650.0000 mg | ORAL_TABLET | Freq: Four times a day (QID) | ORAL | Status: DC | PRN
  Administered 2016-06-02: 650 mg via ORAL
  Filled 2016-06-01: qty 2

## 2016-06-01 MED ORDER — RIVAROXABAN 15 MG PO TABS
15.0000 mg | ORAL_TABLET | Freq: Every day | ORAL | Status: DC
  Administered 2016-06-02: 15 mg via ORAL
  Filled 2016-06-01: qty 1

## 2016-06-01 MED ORDER — SODIUM CHLORIDE 0.9% FLUSH
3.0000 mL | Freq: Two times a day (BID) | INTRAVENOUS | Status: DC
  Administered 2016-06-01: 3 mL via INTRAVENOUS

## 2016-06-01 MED ORDER — RIVAROXABAN 20 MG PO TABS: 20.0000 mg | ORAL_TABLET | Freq: Every day | ORAL | Status: DC

## 2016-06-01 MED ORDER — ONDANSETRON HCL 4 MG/2ML IJ SOLN: 4.0000 mg | Freq: Four times a day (QID) | INTRAMUSCULAR | Status: DC | PRN

## 2016-06-01 NOTE — ED Triage Notes (Signed)
Per EMS - pt from [facility] assisted living. Pt experienced shortness of breath and CP this morning. Pt also reports lightheadedness, dizziness, nausea, generalized weakness. Given 324mg  aspirin by facility. Pt diaphoretic upon EMS arrival, found in afib (hx same), hr 38-44bpm. Given 200cc NS, last hr 50-66bpm. Vomited 1x prior to arrival to ED. Given 4mg  zofran. Denies CP at this time.

## 2016-06-01 NOTE — ED Notes (Signed)
Martie Round RN informed Dr. Winfred Leeds pt afib, hr 35-49bpm. No new orders at this time.

## 2016-06-01 NOTE — ED Provider Notes (Signed)
[facility] DEPT Provider Note   CSN: [id number] Arrival date & time: 06/01/16  [id number]     History   Chief Complaint Chief Complaint  Patient presents with  . Shortness of Breath  . Weakness  . Chest Pain    HPI  Blood pressure 153/76, pulse 96, temperature (S) (!) 96.9 F (36.1 C), temperature source (S) Rectal, resp. rate 15, height 5\' 5"  (1.651 m), weight 56.7 kg, SpO2 100 %.  Jamie Thompson is a 82 y.o. female brought in by EMS from [facility], as per EMS patient had shortness of breath and chest pain starting this morning. She was given 325 mg aspirin by facility. EMS also reports that she was lightheaded dizzy nausea and had generalized weakness, she was found to be bradycardic with heart rates it clean 30 and 40s. Patient vomited one time prior to arrival, level V caveat secondary to memory issues, on my exam patient with no complaints, she denies any pain including chest pain, denies shortness of breath. She states that she was drinking a cup of coffee around 7:30 AM and she just "felt terrible." She cannot explain what that means. She denies any pain, headache, unilateral weakness.   Patients son-in-law has presented to the ED: States that he is this patient's primary caregiver, she is an independent living he administers all of her medications, he states that he separates it out weekly and leaves it for her he is concerned that she may have taken more than 1 days dosage, he last saw her day before yesterday and she was in her normal state of health.  HPI  Past Medical History:  Diagnosis Date  . Adenomatous colon polyp 07/17/97  . Allergic rhinitis   . Allergy   . Anxiety disorder   . Atrial fibrillation ([place])   . Chronic anticoagulation   . Diverticulosis   . History of shingles   . Hypercholesteremia   . Hypertension   . Hypothyroidism   . Internal hemorrhoids   . Melanoma ([place])   . Memory loss   . Meniere disease   . Tension headache      Patient Active Problem List   Diagnosis Date Noted  . Hypothermia 06/01/2016  . Atrial fibrillation ([place]) 06/01/2016  . UTI (lower urinary tract infection) 06/01/2016  . Bradycardia 06/01/2016  . Dementia 06/01/2016  . Diaphoresis 06/01/2016  . Shortness of breath 06/01/2016  . Internal hemorrhoid 07/19/2015    Past Surgical History:  Procedure Laterality Date  . CATARACT EXTRACTION Right 2012    OB History    No data available       Home Medications    Prior to Admission medications   Medication Sig Start Date End Date Taking? Authorizing Provider  Calcium Carbonate-Vitamin D (CALCIUM-D PO) Take 1 tablet by mouth daily.   Yes Historical Provider, MD  carvedilol (COREG) 25 MG tablet Take 25 mg by mouth 2 (two) times daily with a meal.   Yes Historical Provider, MD  donepezil (ARICEPT) 10 MG tablet Take 1 tablet (10 mg total) by mouth at bedtime. Patient taking differently: Take 10 mg by mouth daily.  04/07/16  Yes Ward Givens, NP  levothyroxine (SYNTHROID, LEVOTHROID) 50 MCG tablet Take 50 mcg by mouth daily.   Yes Historical Provider, MD  rivaroxaban (XARELTO) 20 MG TABS tablet Take 20 mg by mouth daily with breakfast.   Yes Historical Provider, MD  triamterene-hydrochlorothiazide (MAXZIDE-25) 37.5-25 MG tablet Take 0.5 tablets by mouth daily.  10/29/15  Yes Historical Provider, MD    Family History Family History  Problem Relation Age of Onset  . Stroke Father   . Clotting disorder Father   . Colon cancer Mother   . Colon cancer Sister   . Clotting disorder Sister     Social History Social History  Substance Use Topics  . Smoking status: Never Smoker  . Smokeless tobacco: Never Used  . Alcohol use No     Allergies   Amitriptyline; Amlodipine; Erythromycin; Nitrofurantoin; Sulfa antibiotics; Tavist [clemastine]; Trimethoprim; and Verapamil   Review of Systems Review of Systems   Physical Exam Updated Vital Signs BP 160/87   Pulse 73   Temp  (S) (!) 96.9 F (36.1 C) (Rectal)   Resp 20   Ht 5\' 5"  (1.651 m)   Wt 56.7 kg   SpO2 98%   BMI 20.80 kg/m   Physical Exam  Constitutional: She appears well-developed and well-nourished. No distress.  HENT:  Head: Normocephalic and atraumatic.  Mouth/Throat: Oropharynx is clear and moist.  Eyes: Conjunctivae and EOM are normal. Pupils are equal, round, and reactive to light.  Neck: Normal range of motion.  Cardiovascular: Normal rate, regular rhythm and intact distal pulses.  Exam reveals no gallop and no friction rub.   No murmur heard. Pulmonary/Chest: Effort normal and breath sounds normal. No respiratory distress. She has no wheezes. She has no rales. She exhibits no tenderness.  Abdominal: Soft. She exhibits no distension and no mass. There is no tenderness. There is no rebound and no guarding. No hernia.  Musculoskeletal: Normal range of motion.  Neurological: She is alert.  Confabulating  Skin: Capillary refill takes less than 2 seconds. She is not diaphoretic.  Psychiatric: She has a normal mood and affect.  Nursing note and vitals reviewed.    ED Treatments / Results  Labs (all labs ordered are listed, but only abnormal results are displayed) Labs Reviewed  BASIC METABOLIC PANEL - Abnormal; Notable for the following:       Result Value   Sodium 133 (*)    Potassium 3.4 (*)    Glucose, Bld 172 (*)    BUN 24 (*)    Creatinine, Ser 1.04 (*)    GFR calc non Af Amer 46 (*)    GFR calc Af Amer 54 (*)    All other components within normal limits  PROTIME-INR - Abnormal; Notable for the following:    Prothrombin Time 30.0 (*)    All other components within normal limits  URINALYSIS, ROUTINE W REFLEX MICROSCOPIC (NOT AT [facility]) - Abnormal; Notable for the following:    APPearance CLOUDY (*)    Hgb urine dipstick SMALL (*)    Nitrite POSITIVE (*)    Leukocytes, UA MODERATE (*)    All other components within normal limits  URINE MICROSCOPIC-ADD ON - Abnormal; Notable  for the following:    Squamous Epithelial / LPF 0-5 (*)    Bacteria, UA MANY (*)    All other components within normal limits  CULTURE, BLOOD (ROUTINE X 2)  CULTURE, BLOOD (ROUTINE X 2)  URINE CULTURE  CBC WITH DIFFERENTIAL/PLATELET  BRAIN NATRIURETIC PEPTIDE  TSH  I-STAT TROPOININ, ED  I-STAT CG4 LACTIC ACID, ED  POC OCCULT BLOOD, ED  I-STAT CG4 LACTIC ACID, ED    EKG  EKG Interpretation  Date/Time:  Monday June 01 2016 09:43:24 EDT Ventricular Rate:  43 PR Interval:    QRS Duration: 113 QT Interval:  517 QTC Calculation: 438 R Axis:  17 Text Interpretation:  Atrial fibrillation Incomplete left bundle branch block Anterior Q waves, possibly due to ILBBB Baseline wander in lead(s) V1 No significant change since last tracing Confirmed by Winfred Leeds  MD, SAM [phone]) on 06/01/2016 10:26:19 AM       Radiology Dg Chest Portable 1 View  Result Date: 06/01/2016 CLINICAL DATA:  Chest pain and shortness of breath this morning with lightheadedness, and tibia, nausea and weakness. EXAM: PORTABLE CHEST 1 VIEW COMPARISON:  None. FINDINGS: The cardiac silhouette, mediastinal and hilar contours are within normal limits for age. There is mild tortuosity and calcification of the thoracic aorta. The lungs are clear. No pleural effusion. No pulmonary lesions. The bony thorax is intact. IMPRESSION: No acute cardiopulmonary findings. Electronically Signed   By: Marijo Sanes M.D.   On: 06/01/2016 10:31    Procedures Procedures (including critical care time)  Medications Ordered in ED Medications  cefTRIAXone (ROCEPHIN) 1 g in dextrose 5 % 50 mL IVPB (1 g Intravenous New Bag/Given 06/01/16 1238)     Initial Impression / Assessment and Plan / ED Course  I have reviewed the triage vital signs and the nursing notes.  Pertinent labs & imaging results that were available during my care of the patient were reviewed by me and considered in my medical decision making (see chart for  details).  Clinical Course    Vitals:   06/01/16 1130 06/01/16 1215 06/01/16 1246 06/01/16 1300  BP: 153/93 150/81 161/85 160/87  Pulse: 60 (!) 59 73 73  Resp: 14 16 16 20   Temp:      TempSrc:      SpO2: 100% 100% 100% 98%  Weight:      Height:        Medications  cefTRIAXone (ROCEPHIN) 1 g in dextrose 5 % 50 mL IVPB (1 g Intravenous New Bag/Given 06/01/16 1238)    Jamie Thompson is 82 y.o. female presenting with Possible episode of chest pain, shortness of breath, diaphoresis and emesis this a.m. Patient denies these however she has memory issues, may have forgotten, the EMS report is quite specific. Patient has history of A. fib with anticoagulation with Xarelto. No recent EKG on her, however she is bradycardic with a rate between the 30s and 50s. Patient is hypothermic with a temperature taken rectally of 96.9. She is pancultured, urinalysis is consistent with infection. Patient started on Rocephin. I have asked son to return to her assisted living to count her pills and see if she may have taken more than her normal dosage of her beta blocker explaining her bradycardia.  Patient admitted to [facility] hospitalist NP Lissa Merlin with Dr. Marily Memos for bradycardia, hypothermia, UTI.  This is a shared visit with the attending physician who personally evaluated the patient and agrees with the care plan.    Final Clinical Impressions(s) / ED Diagnoses   Final diagnoses:  Bradycardia  UTI (lower urinary tract infection)  Hypothermia, initial encounter      Monico Blitz, PA-C 06/01/16 [place], MD 06/01/16 1715

## 2016-06-01 NOTE — ED Notes (Signed)
Nevin Bloodgood, NP w/ [facility] at bedside.

## 2016-06-01 NOTE — ED Provider Notes (Signed)
Patient states she was sitting drinking her coffee 7:30 AM today when she felt generally bad. She denies pain anywhere. Denies chest pain denies abdominal pain denies headaccomplains of generalized malaise. On exam patient is alert, not appears mildly uncomfortable HEENT exam no facial asymmetry neck supple heart irregularly irregular bradycardic abdomen nondistended nontender all 4 extremities without redness swelling or tenderness neurovascular intact. Neurologic Glasgow Coma Score 15 cranial nerves II through XII intact moves all extremities well   Orlie Dakin, MD 06/01/16 1715

## 2016-06-01 NOTE — H&P (Signed)
History and Physical    JAMYA POLSINELLI [id number] DOB: 05-Jan-1925 DOA: 06/01/2016  PCP: Horatio Pel, MD  Patient coming from:  [facility]  Chief Complaint: lightheadedness, vomiting, weakness, and chest pain at [facility] today  HPI: Jamie Thompson is a 82 y.o. female with a medical history significant for, but not  limited to, atrial fibrillation and dementia. Patient has memory loss, unable to provide details regarding reason why she was brought to the ED. According to the son-in-law at bedside, patient called staff to her room this morning for complaints of chest pain, shortness of breath and an episode of vomiting. Patient bleed she felt fine going to bed last night. According to the son-in-law, patient is in relatively good health. She seldom has any medical complaints. Son-in-law prepares patients medicines on a weekly basis, there have never been any problems with patient taking the medications as prescribed.   ED Course:  Hypothermic-temp 96.9, bradycardic with heart rate in the forties and fifties, blood pressure high normal, O2 sats 100% on room air Normal lactic acid, normal POC troponin.  WBC normal Urinalysis with many bacteria, positive nitrite, 6-30 wbc's.  Urine and blood culture pending  RX: 324 ASA given at facility.    Review of Systems: Not obtained. Patient has short-term memory loss, cannot remember details from this morning  Past Medical History:  Diagnosis Date  . Adenomatous colon polyp 07/17/97  . Allergic rhinitis   . Allergy   . Anxiety disorder   . Atrial fibrillation ([place])   . Chronic anticoagulation   . Diverticulosis   . History of shingles   . Hypercholesteremia   . Hypertension   . Hypothyroidism   . Internal hemorrhoids   . Melanoma ([place])   . Memory loss   . Meniere disease   . Tension headache     Past Surgical History:  Procedure Laterality Date  . CATARACT EXTRACTION Right 2012    Social History   Social History  .  Marital status: Single    Spouse name: N/A  . Number of children: 2  . Years of education: 78yr colg   Occupational History  . Retired    Social History Main Topics  . Smoking status: Never Smoker  . Smokeless tobacco: Never Used  . Alcohol use No  . Drug use: No  . Sexual activity: Not on file   Other Topics Concern  . Not on file   Social History Narrative   1 cup of coffee a day, occasionally drinks tea or soda    Allergies  Allergen Reactions  . Amitriptyline     Urinary Retention  . Amlodipine Swelling  . Erythromycin Hives  . Nitrofurantoin Nausea Only  . Sulfa Antibiotics     "really ill"  . Tavist [Clemastine]     Urinary hesitancy  . Trimethoprim     Cramps  . Verapamil     Constipation    Family History  Problem Relation Age of Onset  . Stroke Father   . Clotting disorder Father   . Colon cancer Mother   . Colon cancer Sister   . Clotting disorder Sister     Prior to Admission medications   Medication Sig Start Date End Date Taking? Authorizing Provider  atorvastatin (LIPITOR) 10 MG tablet Take 10 mg by mouth daily.    Historical Provider, MD  carvedilol (COREG) 25 MG tablet Take 25 mg by mouth 2 (two) times daily with a meal.    Historical Provider,  MD  donepezil (ARICEPT) 10 MG tablet Take 1 tablet (10 mg total) by mouth at bedtime. 04/07/16   Ward Givens, NP  levothyroxine (SYNTHROID, LEVOTHROID) 50 MCG tablet Take 50 mcg by mouth daily.    Historical Provider, MD  triamterene-hydrochlorothiazide [facility]) 37.5-25 MG tablet  10/29/15   Historical Provider, MD  XARELTO 20 MG TABS tablet  03/23/16   Historical Provider, MD    Physical Exam: Vitals:   06/01/16 1000 06/01/16 1015 06/01/16 1100 06/01/16 1130  BP: 142/70 153/69 162/78 153/93  Pulse: (!) 46 (!) 43 (!) 56 60  Resp: 17 15 14 14   Temp:      TempSrc:      SpO2: 100% 100% 100% 100%  Weight:      Height:        Constitutional:  Pleasant, well developed white female in NAD,  calm, comfortable Vitals:   06/01/16 1000 06/01/16 1015 06/01/16 1100 06/01/16 1130  BP: 142/70 153/69 162/78 153/93  Pulse: (!) 46 (!) 43 (!) 56 60  Resp: 17 15 14 14   Temp:      TempSrc:      SpO2: 100% 100% 100% 100%  Weight:      Height:       Eyes: PER, lids and conjunctivae normal ENMT: Mucous membranes are moist. Posterior pharynx clear of any exudate or lesions..  Neck: normal, supple, no masses Respiratory: clear to auscultation bilaterally, no wheezing, no crackles. Normal respiratory effort. No accessory muscle use.  Cardiovascular: Bradycardic, irregular rhythm . No murmurs / rubs / gallops. No extremity edema. 2+ dorsal pedis pulses.   Abdomen: no tenderness, no masses palpated. No hepatomegaly. Bowel sounds positive.  Musculoskeletal: no clubbing / cyanosis. No joint deformity upper and lower extremities. Good ROM, no contractures. Normal muscle tone.  Skin: no rashes, lesions, ulcers.  Neurologic: CN 2-12 grossly intact. Sensation intact, Strength 5/5 in all 4.  Psychiatric: Pleasant and cooperative, normal mood.   Labs on Admission: I have personally reviewed following labs and imaging studies   Urine analysis:    Component Value Date/Time   COLORURINE YELLOW 06/01/2016 1124   APPEARANCEUR CLOUDY (A) 06/01/2016 1124   LABSPEC 1.026 06/01/2016 1124   PHURINE 5.0 06/01/2016 1124   GLUCOSEU NEGATIVE 06/01/2016 1124   HGBUR SMALL (A) 06/01/2016 1124   BILIRUBINUR NEGATIVE 06/01/2016 1124   KETONESUR NEGATIVE 06/01/2016 1124   PROTEINUR NEGATIVE 06/01/2016 1124   NITRITE POSITIVE (A) 06/01/2016 1124   LEUKOCYTESUR MODERATE (A) 06/01/2016 1124    Radiological Exams on Admission: Dg Chest Portable 1 View  Result Date: 06/01/2016 CLINICAL DATA:  Chest pain and shortness of breath this morning with lightheadedness, and tibia, nausea and weakness. EXAM: PORTABLE CHEST 1 VIEW COMPARISON:  None. FINDINGS: The cardiac silhouette, mediastinal and hilar contours are  within normal limits for age. There is mild tortuosity and calcification of the thoracic aorta. The lungs are clear. No pleural effusion. No pulmonary lesions. The bony thorax is intact. IMPRESSION: No acute cardiopulmonary findings. Electronically Signed   By: Marijo Sanes M.D.   On: 06/01/2016 10:31    EKG: Independently reviewed.   EKG Interpretation  Date/Time:  Monday June 01 2016 09:43:24 EDT Ventricular Rate:  43 PR Interval:    QRS Duration: 113 QT Interval:  517 QTC Calculation: 438 R Axis:   17 Text Interpretation:  Atrial fibrillation Incomplete left bundle branch block Anterior Q waves, possibly due to ILBBB Baseline wander in lead(s) V1 No significant change since last tracing Confirmed  by Winfred Leeds  MD, [place] [phone]) on 06/01/2016 10:26:19 AM       Assessment/Plan   Active Problems:   Hypothermia   Atrial fibrillation (HCC)   UTI (lower urinary tract infection)   Bradycardia   Dementia   Diaphoresis   Shortness of breath   Chest pain / shortness of breath / diaphoresis / episode of vomiting this am.  Symptoms secondary to bradycardia?  Her POC trop negative. In afib but bradycardic with incomplete left BBB (new?). No baseline EKGs to compare. CXR negative  -place in OBS-tele -trend troponins -EDP has consulted Cardiology  Bradycardia, HR in 40-50's. Baseline HR 70-80's. Son in Sports coach prepares home meds, ? Might patient have taken an extra dose of Coreg by mistake.  -monitor on tele -hold Coreg for now  -Await Cardiology input  ? Hypothermia. Temp 96.9 but her baseline is unknown so this may not be significant for patient.  -continue to monitor -blood and urine culture pending   UTI.           -Rocephin started -urine culture sent  AKI. No baseline labs to compare so not sure if underlying CKD. Cr. 1.04, GFR 46.  -Gentle IV hydration over next 12 hours -hold home Maxide for now -am bmet  dementia. Diagnosed within the last few months -Continue home  Aricept  Hypothyroidism. TSH normal -continue home synthroid  Hyperglycemia, glucose 172. U/A negative for glucose.  -check fasting CBG in am .   DVT prophylaxis:    On Xarelto  Code Status:  DNR.  Family Communication:    Treatment plan discussed with Son-in-law in the room, I also spoke with daughter [name] and they both understand and agree with the plan..  Disposition Plan:   Discharge back to [facility] in 24-48 hours              Consults called:  EDP called Cardiology   Admission status:   Observation - Telemetry     Tye Savoy NP [facility] Pager [phone]  If 7PM-7AM, please contact night-coverage [email or web address] Password [email or web address]  06/01/2016, 12:22 PM

## 2016-06-01 NOTE — ED Notes (Signed)
Pt able to stand and use bedside commode w/o assistance.

## 2016-06-02 DIAGNOSIS — Z7901 Long term (current) use of anticoagulants: Secondary | ICD-10-CM | POA: Diagnosis not present

## 2016-06-02 DIAGNOSIS — N179 Acute kidney failure, unspecified: Secondary | ICD-10-CM | POA: Diagnosis present

## 2016-06-02 DIAGNOSIS — Z832 Family history of diseases of the blood and blood-forming organs and certain disorders involving the immune mechanism: Secondary | ICD-10-CM | POA: Diagnosis not present

## 2016-06-02 DIAGNOSIS — R0602 Shortness of breath: Secondary | ICD-10-CM

## 2016-06-02 DIAGNOSIS — E039 Hypothyroidism, unspecified: Secondary | ICD-10-CM | POA: Diagnosis present

## 2016-06-02 DIAGNOSIS — R001 Bradycardia, unspecified: Secondary | ICD-10-CM | POA: Diagnosis not present

## 2016-06-02 DIAGNOSIS — E785 Hyperlipidemia, unspecified: Secondary | ICD-10-CM | POA: Diagnosis present

## 2016-06-02 DIAGNOSIS — Z881 Allergy status to other antibiotic agents status: Secondary | ICD-10-CM | POA: Diagnosis not present

## 2016-06-02 DIAGNOSIS — Z79899 Other long term (current) drug therapy: Secondary | ICD-10-CM | POA: Diagnosis not present

## 2016-06-02 DIAGNOSIS — I447 Left bundle-branch block, unspecified: Secondary | ICD-10-CM | POA: Diagnosis present

## 2016-06-02 DIAGNOSIS — F419 Anxiety disorder, unspecified: Secondary | ICD-10-CM | POA: Diagnosis present

## 2016-06-02 DIAGNOSIS — J309 Allergic rhinitis, unspecified: Secondary | ICD-10-CM | POA: Diagnosis present

## 2016-06-02 DIAGNOSIS — I482 Chronic atrial fibrillation: Secondary | ICD-10-CM

## 2016-06-02 DIAGNOSIS — Z888 Allergy status to other drugs, medicaments and biological substances status: Secondary | ICD-10-CM | POA: Diagnosis not present

## 2016-06-02 DIAGNOSIS — F0390 Unspecified dementia without behavioral disturbance: Secondary | ICD-10-CM | POA: Diagnosis not present

## 2016-06-02 DIAGNOSIS — Z800 Family history of malignant neoplasm of digestive organs: Secondary | ICD-10-CM | POA: Diagnosis not present

## 2016-06-02 DIAGNOSIS — T68XXXS Hypothermia, sequela: Secondary | ICD-10-CM | POA: Diagnosis not present

## 2016-06-02 DIAGNOSIS — B9620 Unspecified Escherichia coli [E. coli] as the cause of diseases classified elsewhere: Secondary | ICD-10-CM | POA: Diagnosis present

## 2016-06-02 DIAGNOSIS — N390 Urinary tract infection, site not specified: Secondary | ICD-10-CM | POA: Diagnosis not present

## 2016-06-02 DIAGNOSIS — Z823 Family history of stroke: Secondary | ICD-10-CM | POA: Diagnosis not present

## 2016-06-02 DIAGNOSIS — Z66 Do not resuscitate: Secondary | ICD-10-CM | POA: Diagnosis present

## 2016-06-02 DIAGNOSIS — T68XXXA Hypothermia, initial encounter: Secondary | ICD-10-CM | POA: Diagnosis not present

## 2016-06-02 DIAGNOSIS — E871 Hypo-osmolality and hyponatremia: Secondary | ICD-10-CM | POA: Diagnosis present

## 2016-06-02 DIAGNOSIS — R739 Hyperglycemia, unspecified: Secondary | ICD-10-CM | POA: Diagnosis present

## 2016-06-02 DIAGNOSIS — C439 Malignant melanoma of skin, unspecified: Secondary | ICD-10-CM | POA: Diagnosis present

## 2016-06-02 DIAGNOSIS — R680 Hypothermia, not associated with low environmental temperature: Secondary | ICD-10-CM | POA: Diagnosis present

## 2016-06-02 DIAGNOSIS — I480 Paroxysmal atrial fibrillation: Secondary | ICD-10-CM | POA: Diagnosis not present

## 2016-06-02 DIAGNOSIS — I10 Essential (primary) hypertension: Secondary | ICD-10-CM

## 2016-06-02 LAB — BASIC METABOLIC PANEL
Anion gap: 9 (ref 5–15)
BUN: 17 mg/dL (ref 6–20)
CHLORIDE: 102 mmol/L (ref 101–111)
CO2: 28 mmol/L (ref 22–32)
CREATININE: 0.91 mg/dL (ref 0.44–1.00)
Calcium: 9.9 mg/dL (ref 8.9–10.3)
GFR calc non Af Amer: 54 mL/min — ABNORMAL LOW (ref >60)
Glucose, Bld: 108 mg/dL — ABNORMAL HIGH (ref 65–99)
POTASSIUM: 3.3 mmol/L — AB (ref 3.5–5.1)
Sodium: 139 mmol/L (ref 135–145)

## 2016-06-02 LAB — CBC
HEMATOCRIT: 40.9 % (ref 36.0–46.0)
Hemoglobin: 12.9 g/dL (ref 12.0–15.0)
MCH: 29.9 pg (ref 26.0–34.0)
MCHC: 31.5 g/dL (ref 30.0–36.0)
MCV: 94.7 fL (ref 78.0–100.0)
PLATELETS: 251 10*3/uL (ref 150–400)
RBC: 4.32 MIL/uL (ref 3.87–5.11)
RDW: 12.9 % (ref 11.5–15.5)
WBC: 11.8 10*3/uL — ABNORMAL HIGH (ref 4.0–10.5)

## 2016-06-02 LAB — TROPONIN I: Troponin I: <0.03 ng/mL (ref <0.03)

## 2016-06-02 LAB — GLUCOSE, CAPILLARY: Glucose-Capillary: 119 mg/dL — ABNORMAL HIGH (ref 65–99)

## 2016-06-02 MED ORDER — CARVEDILOL 6.25 MG PO TABS
6.2500 mg | ORAL_TABLET | Freq: Two times a day (BID) | ORAL | Status: DC
  Administered 2016-06-02 – 2016-06-03 (×2): 6.25 mg via ORAL
  Filled 2016-06-02 (×2): qty 1

## 2016-06-02 MED ORDER — POTASSIUM CHLORIDE CRYS ER 20 MEQ PO TBCR
40.0000 meq | EXTENDED_RELEASE_TABLET | Freq: Four times a day (QID) | ORAL | Status: AC
  Administered 2016-06-02 (×2): 40 meq via ORAL
  Filled 2016-06-02 (×2): qty 2

## 2016-06-02 MED ORDER — SODIUM CHLORIDE 0.9 % IV SOLN
INTRAVENOUS | Status: AC
  Administered 2016-06-02: 12:00:00 via INTRAVENOUS

## 2016-06-02 NOTE — Consult Note (Signed)
Cardiology Consult    Patient ID: Jamie Thompson MRN: [id number], DOB/AGE: 1925-05-16   Admit date: 06/01/2016 Date of Consult: 06/02/2016  Primary Physician: Horatio Pel, MD Primary Cardiologist: None Requesting Provider: Dr. Hartford Poli Reason for Consultation: Chest pain  Patient Profile    82 yo female with PMH of chronic A-Fib/HTN/HLD/hypothyroidism and memory loss who presented to the [facility] ED for weakness.   Past Medical History   Past Medical History:  Diagnosis Date  . Adenomatous colon polyp 07/17/97  . Allergic rhinitis   . Allergy   . Anxiety disorder   . Atrial fibrillation ([place])   . Chronic anticoagulation   . Diverticulosis   . History of shingles   . Hypercholesteremia   . Hypertension   . Hypothyroidism   . Internal hemorrhoids   . Melanoma ([place])   . Memory loss   . Meniere disease   . Tension headache     Past Surgical History:  Procedure Laterality Date  . CATARACT EXTRACTION Right 2012     Allergies  Allergies  Allergen Reactions  . Amitriptyline     Urinary Retention  . Amlodipine Swelling  . Erythromycin Hives  . Nitrofurantoin Nausea Only  . Sulfa Antibiotics     "really ill"  . Tavist [Clemastine]     Urinary hesitancy  . Trimethoprim     Cramps  . Verapamil     Constipation    History of Present Illness    Jamie Thompson is an 82 yo female with PMH of chronic A-Fib/HTN/HLD/hypothyroidism and memory loss. Reports she is followed by her PCP for her A-fib and was diagnosed about 15 years ago. May have seen a cardiologist in the past, but unsure of who it was. Currently lives at [facility] assisted living facility, and lives mostly independently with minimal assistance from staff there.   Reports being in her usual state of health until yesterday. She was sitting her in chair and had a sudden onset of weakness, feeling flushed, and nauseated. Reports she attempted to get up from the chair and felt like all of her energy  drained from her body. At that time she called for nursing assistance, and was transported to the [facility] ED for further evaluation. EMS noted that she was bradycardiac with rates in the 30-40s. She denied any chest pain, dyspnea, palpitations, or lower extremity edema.   In the ED her EKG showed A-fib with a rate in the 40s with incomplete LBBB, blood pressure was noted to be elevated. Her UA was positive for WBCs and nitrites. Her trops were cycled and negative. She was started on IV antibiotics and admitted to Internal Medicine for further work up.   Inpatient Medications    . cefTRIAXone (ROCEPHIN)  IV  1 g Intravenous Q24H  . donepezil  10 mg Oral Daily  . levothyroxine  50 mcg Oral Daily  . potassium chloride  40 mEq Oral Q6H  . rivaroxaban  15 mg Oral Q supper  . sodium chloride flush  3 mL Intravenous Q12H    Family History    Family History  Problem Relation Age of Onset  . Stroke Father   . Clotting disorder Father   . Colon cancer Mother   . Colon cancer Sister   . Clotting disorder Sister     Social History    Social History   Social History  . Marital status: Single    Spouse name: N/A  . Number of children: 2  .  Years of education: 18yr colg   Occupational History  . Retired    Social History Main Topics  . Smoking status: Never Smoker  . Smokeless tobacco: Never Used  . Alcohol use No  . Drug use: No  . Sexual activity: Not on file   Other Topics Concern  . Not on file   Social History Narrative   1 cup of coffee a day, occasionally drinks tea or soda     Review of Systems    General:  No chills, fever, night sweats or weight changes.  Cardiovascular:  See HPI Dermatological: No rash, lesions/masses Respiratory: No cough, dyspnea Urologic: No hematuria, dysuria Abdominal:   No nausea, vomiting, diarrhea, bright red blood per rectum, melena, or hematemesis Neurologic:  No visual changes, + wkns, changes in mental status. All other systems  reviewed and are otherwise negative except as noted above.  Physical Exam    Blood pressure (!) 164/83, pulse 77, temperature 98.2 F (36.8 C), temperature source Oral, resp. rate 18, height 5\' 5"  (1.651 m), weight 125 lb (56.7 kg), SpO2 98 %.  General: Pleasant older female, NAD Psych: Normal affect. Neuro: Alert and oriented X 3. Moves all extremities spontaneously. HEENT: Normal  Neck: Supple without bruits or JVD. Lungs:  Resp regular and unlabored, CTA. Heart: Irregularly Irregular no s3, s4, or murmurs. Abdomen: Soft, non-tender, non-distended, BS + x 4.  Extremities: No clubbing, cyanosis or edema. DP/PT/Radials 2+ and equal bilaterally.  Labs    Troponin [facility] Test)  Recent Labs  06/01/16 1006  TROPIPOC 0.00    Recent Labs  06/01/16 1500 06/01/16 1937 06/02/16 0150  TROPONINI <0.03 <0.03 <0.03   Lab Results  Component Value Date   WBC 11.8 (H) 06/02/2016   HGB 12.9 06/02/2016   HCT 40.9 06/02/2016   MCV 94.7 06/02/2016   PLT 251 06/02/2016    Recent Labs Lab 06/02/16 0150  NA 139  K 3.3*  CL 102  CO2 28  BUN 17  CREATININE 0.91  CALCIUM 9.9  GLUCOSE 108*   No results found for: CHOL, HDL, LDLCALC, TRIG No results found for: [facility]   Radiology Studies    Dg Chest Portable 1 View  Result Date: 06/01/2016 CLINICAL DATA:  Chest pain and shortness of breath this morning with lightheadedness, and tibia, nausea and weakness. EXAM: PORTABLE CHEST 1 VIEW COMPARISON:  None. FINDINGS: The cardiac silhouette, mediastinal and hilar contours are within normal limits for age. There is mild tortuosity and calcification of the thoracic aorta. The lungs are clear. No pleural effusion. No pulmonary lesions. The bony thorax is intact. IMPRESSION: No acute cardiopulmonary findings. Electronically Signed   By: Marijo Sanes M.D.   On: 06/01/2016 10:31    ECG & Cardiac Imaging    EKG: A-Fib, rate 40s, Incomplete LBBB  Echo: None  Assessment & Plan    82  yo female with PMH of chronic A-Fib/HTN/HLD/hypothyroidism and memory loss who presented to the [facility] ED for weakness.   1. Weakness/A-Fib: Likely multifocal. Noted symptomatic bradycardia on EMS arrival and noted while in the ED. Coreg was held this admission, home dose appears to be 25mg  BID. There was question about whether she doubled up on her BB, but family reports they do her medications every week and they checked her medication box. Also dx with a UTI this admission and placed on antibiotics. TSH normal.  -- HR has improved, has been in coreg for the past 15 years and does  not report any issues with presyncope/syncope or bradycardia in the past. Coreg has been held this admission. Would restart coreg at lower dose (6.25mg  BID), and observe for further episodes of bradycardia. Consider stopping Aricept?  -- This patients CHA2DS2-VASc Score and unadjusted Ischemic Stroke Rate (% per year) is equal to 4.8 % stroke rate/year from a score of 4 Above score calculated as 1 point each if present [CHF, HTN, DM, Vascular=MI/PAD/Aortic Plaque, Age if 65-74, or Female] Above score calculated as 2 points each if present [Age > 75, or Stroke/TIA/TE] Continue Xarelto for anticoagulation. Hgb Stable  2. HTN: Elevated this admission but BB was held given her bradycardia. Would restart as stated above.   3. UTI: Mild WBC, Treated with rocephin   4. Hypothyroidism: TSH normal, continue synthroid     Barnet Pall, NP-C Pager [phone] 06/02/2016, 12:32 PM  Patient examined chart reviewed. Exam remarkable for afib rates 90 with soft SEM Othewise she looks well for her age. Suspect coreg dose too high and she got Bradycardic in setting of UTI.  Continue xarelto at low dose and decrease coreg To 6.25 bid. No indication for pacer. D/c Aricept given bradycardia.  Discussed Care with daughter Jamie Thompson and patient  Jamie Thompson

## 2016-06-02 NOTE — Progress Notes (Signed)
PROGRESS NOTE  Jamie Thompson  [id number] DOB: 1925/02/25 DOA: 06/01/2016 PCP: Horatio Pel, MD Outpatient Specialists:  Subjective: Feels much better, does not remember why she is in the hospital  Brief Narrative:  Jamie Thompson is a 82 y.o. female with a medical history significant for, but not  limited to, atrial fibrillation and dementia. Patient has memory loss, unable to provide details regarding reason why she was brought to the ED. According to the son-in-law at bedside, patient called staff to her room this morning for complaints of chest pain, shortness of breath and an episode of vomiting. Patient bleed she felt fine going to bed last night. According to the son-in-law, patient is in relatively good health. She seldom has any medical complaints. Son-in-law prepares patients medicines on a weekly basis, there have never been any problems with patient taking the medications as prescribed.   Assessment & Plan:   Active Problems:   Hypothermia   Atrial fibrillation (HCC)   UTI (lower urinary tract infection)   Bradycardia   Dementia   Diaphoresis   Shortness of breath   UTI.           -Urinalysis consistent with UTI, patient also had nausea, vomiting and hypothermia. -Started on Rocephin, continued will adjust antibiotics according to culture results.  Chest pain / shortness of breath / diaphoresis / episode of vomiting this am.   -Symptoms secondary to bradycardia?  Her POC trop negative. Bradycardia with atrial fibrillation. -Heart rate is back to normal, reportedly EDP called cardiology, apparently no one got called -Heart rate still in the low side at 59 this morning. Negative troponin  Atrial fibrillation -Chronic atrial fibrillation, now with slow ventricular response. -On Xarelto, CHA2DS2-VASc is at least 4.  Bradycardia, HR in 40-50's. Baseline HR 70-80's. Son in Sports coach prepares home meds, ? Might patient have taken an extra dose of Coreg by mistake.    -Continue monitoring on telemetry, weight cardiology input.  ? Hypothermia. Temp 96.9 but her baseline is unknown so this may not be significant for patient.  -This is likely secondary to UTI.  AKI. No baseline labs to compare so not sure if underlying CKD. Cr. 1.04, GFR 46.  -Mild, this is resolved with gentle IV hydration. bmet  dementia. Diagnosed within the last few months -Continue home Aricept  Hypothyroidism. TSH normal -continue home synthroid  Hyperglycemia, glucose 172. U/A negative for glucose.  -Fasting blood glucose is 108 this morning NOT consistent with diabetes.  Hyponatremia -Replete with oral supplements.   DVT prophylaxis: On Xarelto Code Status: DNR Family Communication:  Disposition Plan:  Diet: Diet Heart Room service appropriate? Yes; Fluid consistency: Thin  Consultants:   CARDs  Procedures:    None  Antimicrobials:   Rocephin   Objective: Vitals:   06/01/16 1300 06/01/16 1410 06/01/16 1933 06/02/16 0544  BP: 160/87 (!) 171/74 (!) 150/61 (!) 164/83  Pulse: 73 74 75 77  Resp: 20 18 19 18   Temp:  97.6 F (36.4 C) 97.4 F (36.3 C) 98.2 F (36.8 C)  TempSrc:  Oral Oral Oral  SpO2: 98% 99% 98% 98%  Weight:      Height:        Intake/Output Summary (Last 24 hours) at 06/02/16 1122 Last data filed at 06/02/16 0730  Gross per 24 hour  Intake            937.5 ml  Output              620 ml  Net            317.5 ml   Filed Weights   06/01/16 0950  Weight: 56.7 kg (125 lb)    Examination: General exam: Appears calm and comfortable  Respiratory system: Clear to auscultation. Respiratory effort normal. Cardiovascular system: S1 & S2 heard, RRR. No JVD, murmurs, rubs, gallops or clicks. No pedal edema. Gastrointestinal system: Abdomen is nondistended, soft and nontender. No organomegaly or masses felt. Normal bowel sounds heard. Central nervous system: Alert and oriented. No focal neurological deficits. Extremities: Symmetric 5  x 5 power. Skin: No rashes, lesions or ulcers Psychiatry: Judgement and insight appear normal. Mood & affect appropriate.   Data Reviewed: I have personally reviewed following labs and imaging studies  CBC:  Recent Labs Lab 06/01/16 0950 06/02/16 0150  WBC 7.0 11.8*  NEUTROABS 5.2  --   HGB 12.0 12.9  HCT 37.3 40.9  MCV 93.5 94.7  PLT 209 [id number]   Basic Metabolic Panel:  Recent Labs Lab 06/01/16 0950 06/02/16 0150  NA 133* 139  K 3.4* 3.3*  CL 102 102  CO2 23 28  GLUCOSE 172* 108*  BUN 24* 17  CREATININE 1.04* 0.91  CALCIUM 9.2 9.9   GFR: Estimated Creatinine Clearance: 37.5 mL/min (by C-G formula based on SCr of 0.91 mg/dL). Liver Function Tests: No results for input(s): AST, ALT, ALKPHOS, BILITOT, PROT, ALBUMIN in the last 168 hours. No results for input(s): LIPASE, AMYLASE in the last 168 hours. No results for input(s): AMMONIA in the last 168 hours. Coagulation Profile:  Recent Labs Lab 06/01/16 0950  INR 2.79   Cardiac Enzymes:  Recent Labs Lab 06/01/16 1500 06/01/16 1937 06/02/16 0150  TROPONINI <0.03 <0.03 <0.03   BNP (last 3 results) No results for input(s): PROBNP in the last 8760 hours. HbA1C: No results for input(s): HGBA1C in the last 72 hours. CBG:  Recent Labs Lab 06/02/16 0759  GLUCAP 119*   Lipid Profile: No results for input(s): CHOL, HDL, LDLCALC, TRIG, CHOLHDL, LDLDIRECT in the last 72 hours. Thyroid Function Tests:  Recent Labs  06/01/16 0950  TSH 4.412   Anemia Panel: No results for input(s): VITAMINB12, FOLATE, FERRITIN, TIBC, IRON, RETICCTPCT in the last 72 hours. Urine analysis:    Component Value Date/Time   COLORURINE YELLOW 06/01/2016 1124   APPEARANCEUR CLOUDY (A) 06/01/2016 1124   LABSPEC 1.026 06/01/2016 1124   PHURINE 5.0 06/01/2016 1124   GLUCOSEU NEGATIVE 06/01/2016 1124   HGBUR SMALL (A) 06/01/2016 1124   BILIRUBINUR NEGATIVE 06/01/2016 1124   KETONESUR NEGATIVE 06/01/2016 1124   PROTEINUR NEGATIVE  06/01/2016 1124   NITRITE POSITIVE (A) 06/01/2016 1124   LEUKOCYTESUR MODERATE (A) 06/01/2016 1124   Sepsis Labs: @LABRCNTIP (procalcitonin:4,lacticidven:4)  ) Recent Results (from the past 240 hour(s))  Urine culture     Status: Abnormal (Preliminary result)   Collection Time: 06/01/16 11:24 AM  Result Value Ref Range Status   Specimen Description URINE, RANDOM  Final   Special Requests NONE  Final   Culture (A)  Final    >=100,000 COLONIES/mL ESCHERICHIA COLI SUSCEPTIBILITIES TO FOLLOW    Report Status PENDING  Incomplete     Invalid input(s): PROCALCITONIN, LACTICACIDVEN   Radiology Studies: Dg Chest Portable 1 View  Result Date: 06/01/2016 CLINICAL DATA:  Chest pain and shortness of breath this morning with lightheadedness, and tibia, nausea and weakness. EXAM: PORTABLE CHEST 1 VIEW COMPARISON:  None. FINDINGS: The cardiac silhouette, mediastinal and hilar contours are within normal limits for age. There is  mild tortuosity and calcification of the thoracic aorta. The lungs are clear. No pleural effusion. No pulmonary lesions. The bony thorax is intact. IMPRESSION: No acute cardiopulmonary findings. Electronically Signed   By: Marijo Sanes M.D.   On: 06/01/2016 10:31        Scheduled Meds: . cefTRIAXone (ROCEPHIN)  IV  1 g Intravenous Q24H  . donepezil  10 mg Oral Daily  . levothyroxine  50 mcg Oral Daily  . rivaroxaban  15 mg Oral Q supper  . sodium chloride flush  3 mL Intravenous Q12H   Continuous Infusions: . sodium chloride 75 mL/hr at 06/02/16 0852     LOS: 0 days    Time spent: 35 minutes    Xaria Judon A, MD [facility] Pager [phone]  If 7PM-7AM, please contact night-coverage [email or web address] Password [email or web address] 06/02/2016, 11:22 AM

## 2016-06-02 NOTE — Care Management Obs Status (Signed)
[place] NOTIFICATION   Patient Details  Name: Jamie Thompson MRN: [id number] Date of Birth: 1925/05/06   Medicare Observation Status Notification Given:  Yes (explained observation letter, answered patient's questions)    Apolonio Schneiders, RN 06/02/2016, 6:30 PM

## 2016-06-03 DIAGNOSIS — B9620 Unspecified Escherichia coli [E. coli] as the cause of diseases classified elsewhere: Secondary | ICD-10-CM

## 2016-06-03 DIAGNOSIS — R001 Bradycardia, unspecified: Secondary | ICD-10-CM

## 2016-06-03 DIAGNOSIS — N390 Urinary tract infection, site not specified: Principal | ICD-10-CM

## 2016-06-03 DIAGNOSIS — T68XXXS Hypothermia, sequela: Secondary | ICD-10-CM

## 2016-06-03 LAB — BASIC METABOLIC PANEL
Anion gap: 11 (ref 5–15)
BUN: 15 mg/dL (ref 6–20)
CHLORIDE: 103 mmol/L (ref 101–111)
CO2: 23 mmol/L (ref 22–32)
CREATININE: 0.79 mg/dL (ref 0.44–1.00)
Calcium: 8.7 mg/dL — ABNORMAL LOW (ref 8.9–10.3)
Glucose, Bld: 109 mg/dL — ABNORMAL HIGH (ref 65–99)
POTASSIUM: 4 mmol/L (ref 3.5–5.1)
SODIUM: 137 mmol/L (ref 135–145)

## 2016-06-03 LAB — URINE CULTURE: Culture: >=100000 — AB

## 2016-06-03 LAB — GLUCOSE, CAPILLARY: GLUCOSE-CAPILLARY: 96 mg/dL (ref 65–99)

## 2016-06-03 MED ORDER — AMOXICILLIN 500 MG PO CAPS: 500.0000 mg | ORAL_CAPSULE | Freq: Two times a day (BID) | ORAL | 0 refills | Status: AC

## 2016-06-03 MED ORDER — RIVAROXABAN 15 MG PO TABS: 15.0000 mg | ORAL_TABLET | Freq: Every day | ORAL | 2 refills | Status: DC

## 2016-06-03 MED ORDER — LORAZEPAM 2 MG/ML IJ SOLN
0.5000 mg | Freq: Once | INTRAMUSCULAR | Status: AC
  Administered 2016-06-03: 0.5 mg via INTRAVENOUS
  Filled 2016-06-03: qty 1

## 2016-06-03 MED ORDER — CARVEDILOL 6.25 MG PO TABS: 6.2500 mg | ORAL_TABLET | Freq: Two times a day (BID) | ORAL | 3 refills | Status: AC

## 2016-06-03 MED ORDER — AMOXICILLIN 500 MG PO CAPS
500.0000 mg | ORAL_CAPSULE | Freq: Two times a day (BID) | ORAL | Status: DC
  Administered 2016-06-03: 500 mg via ORAL
  Filled 2016-06-03: qty 1

## 2016-06-03 NOTE — Progress Notes (Signed)
Subjective:  Denies SSCP, palpitations or Dyspnea Dementia poor memory  Objective:  Vitals:   06/02/16 0544 06/02/16 1340 06/02/16 1951 06/03/16 0312  BP: (!) 164/83 (!) 127/59 (!) 144/66 (!) 154/71  Pulse: 77 69 83 83  Resp: 18 18 18 19   Temp: 98.2 F (36.8 C) 98.3 F (36.8 C) 97.8 F (36.6 C) 98.1 F (36.7 C)  TempSrc: Oral Oral Oral Oral  SpO2: 98% 98% 99% 100%  Weight:      Height:        Intake/Output from previous day:  Intake/Output Summary (Last 24 hours) at 06/03/16 0758 Last data filed at 06/03/16 0400  Gross per 24 hour  Intake              480 ml  Output             1300 ml  Net             -820 ml    Physical Exam: Affect appropriate Healthy:  appears stated age HEENT: normal Neck supple with no adenopathy JVP normal no bruits no thyromegaly Lungs clear with no wheezing and good diaphragmatic motion Heart:  S1/S2 SEM murmur, no rub, gallop or click PMI normal Abdomen: benighn, BS positve, no tenderness, no AAA no bruit.  No HSM or HJR Distal pulses intact with no bruits No edema Neuro non-focal Skin warm and dry No muscular weakness   Lab Results: Basic Metabolic Panel:  Recent Labs  06/02/16 0150 06/03/16 0316  NA 139 137  K 3.3* 4.0  CL 102 103  CO2 28 23  GLUCOSE 108* 109*  BUN 17 15  CREATININE 0.91 0.79  CALCIUM 9.9 8.7*   Liver Function Tests: No results for input(s): AST, ALT, ALKPHOS, BILITOT, PROT, ALBUMIN in the last 72 hours. No results for input(s): LIPASE, AMYLASE in the last 72 hours. CBC:  Recent Labs  06/01/16 0950 06/02/16 0150  WBC 7.0 11.8*  NEUTROABS 5.2  --   HGB 12.0 12.9  HCT 37.3 40.9  MCV 93.5 94.7  PLT 209 251   Cardiac Enzymes:  Recent Labs  06/01/16 1500 06/01/16 1937 06/02/16 0150  TROPONINI <0.03 <0.03 <0.03    Thyroid Function Tests:  Recent Labs  06/01/16 0950  TSH 4.412   Anemia Panel: No results for input(s): VITAMINB12, FOLATE, FERRITIN, TIBC, IRON, RETICCTPCT in  the last 72 hours.  Imaging: Dg Chest Portable 1 View  Result Date: 06/01/2016 CLINICAL DATA:  Chest pain and shortness of breath this morning with lightheadedness, and tibia, nausea and weakness. EXAM: PORTABLE CHEST 1 VIEW COMPARISON:  None. FINDINGS: The cardiac silhouette, mediastinal and hilar contours are within normal limits for age. There is mild tortuosity and calcification of the thoracic aorta. The lungs are clear. No pleural effusion. No pulmonary lesions. The bony thorax is intact. IMPRESSION: No acute cardiopulmonary findings. Electronically Signed   By: Marijo Sanes M.D.   On: 06/01/2016 10:31    Cardiac Studies:  ECG: afib no acute changes    Telemetry:  afib rates 80's   Echo:   Medications:   . carvedilol  6.25 mg Oral BID WC  . cefTRIAXone (ROCEPHIN)  IV  1 g Intravenous Q24H  . levothyroxine  50 mcg Oral Daily  . rivaroxaban  15 mg Oral Q supper  . sodium chloride flush  3 mL Intravenous Q12H       Assessment/Plan:  AFib:  Admitted with low rate on fairly high dose coreg. AFib is  chronic. Followed by primary Rate fine on lower dose coreg. Continue lower dose xarelto.  Aricept stopped due to bradycardia  Cardiac status stable will sign off   Jenkins Rouge 06/03/2016, 7:58 AM

## 2016-06-03 NOTE — Discharge Summary (Signed)
Physician Discharge Summary  Jamie Thompson [id number] DOB: 01/17/1925 DOA: 06/01/2016  PCP: Horatio Pel, MD  Admit date: 06/01/2016 Discharge date: 06/03/2016  Time spent: 65 minutes  Recommendations for Outpatient Follow-up:  1. Follow-up with Horatio Pel, MD in 2 weeks. On follow-up patient will need a basic metabolic profile done to follow-up on electrolytes and renal function. Patient's heart rate will need to be reassessed at that time as Coreg dose has been decreased and Aricept discontinued secondary to bradycardia. 2. Follow-up with neurologist in 1-2 months. Patient's Aricept was discontinued secondary to bradycardia.   Discharge Diagnoses:  Principal Problem:   Hypothermia Active Problems:   Bradycardia   E. coli UTI   Atrial fibrillation (HCC)   Dementia   Diaphoresis   Shortness of breath   Discharge Condition: Stable and improved.  Diet recommendation: Heart healthy.  Filed Weights   06/01/16 0950  Weight: 56.7 kg (125 lb)    History of present illness:  Per Dr Lawrence Santiago C Maclinis a 82 y.o.femalewith a medical history significant for, but not limited to, atrial fibrillation and dementia. Patient has memory loss, unable to provide details regarding reason why she was brought to the ED. According to the son-in-law at bedside, patient called staff to her room the morning Of admission, for complaints of chest pain, shortness of breath and an episode of vomiting. Patient stated she felt fine going to bed the night prior to admission. According to the son-in-law, patient is in relatively good health. She seldom has any medical complaints. Son-in-law prepares patients medicines on a weekly basis, there have never been any problems with patient taking the medications as prescribed.  Hospital Course:  Escherichia coli UTI.  -Urinalysis consistent with UTI, patient also had nausea, vomiting and hypothermia. -Started on Rocephin, pending  urine cultures which grew out Escherichia coli which was pansensitive. Patient was subsequently discharged on 5 more days of oral amoxicillin to complete a course of antibiotic treatment. Outpatient follow-up.  Chest pain / shortness of breath / diaphoresis / episode of vomiting this am. -Symptoms secondary to bradycardia? Her POC trop negative. Bradycardia with atrial fibrillation. -Heart rate improved with discontinuation of Aricept and decreased dose of Coreg. Cardiac enzymes negative. Per cardiology no further workup needed.  Atrial fibrillation -Chronic atrial fibrillation, now with slow ventricular response. -On Xarelto, CHA2DS2-VASc is at least 4. Coreg dose was decreased per cardiology recommendations.  Bradycardia, HR in 40-50's. Baseline HR 70-80's. Son in Sports coach prepares home meds, ? Might patient have taken an extra dose of Coreg by mistake. Patient's Coreg dose was decreased per cardiology recommendations and Aricept discontinued secondary to bradycardia. Bradycardia improved. Outpatient follow-up.  ? Hypothermia. Temp 96.9 but her baseline is unknown so this may not be significant for patient.  -This was likely secondary to UTI. Resolved by day of discharge.  AKI.No baseline labs to compare so not sure if underlying CKD. Cr. 1.04, GFR 46.  -Mild, this resolved with gentle IV hydration.  dementia. Diagnosed within the last few months -Continued on home Aricept initially, which was subsequently discontinued secondary to presentation with bradycardia.  Hypothyroidism. TSH normal -continued on home regimen of synthroid  Hyperglycemia,glucose 172. U/A negative for glucose.  -Fasting blood glucose was 108 in the morning NOT consistent with diabetes.  Hyponatremia -Repleted with oral supplements.  Procedures:  Chest x-ray 06/01/2016  Consultations:  Cardiology: Dr Johnsie Cancel 06/02/2016  Discharge Exam: Vitals:   06/02/16 1951 06/03/16 0312  BP: (!) 144/66 (!)  154/71  Pulse:  83 83  Resp: 18 19  Temp: 97.8 F (36.6 C) 98.1 F (36.7 C)    General: NAD Cardiovascular: RRR Respiratory: CTAB  Discharge Instructions   Discharge Instructions    Diet - low sodium heart healthy    Complete by:  As directed   Discharge instructions    Complete by:  As directed   Follow up with Horatio Pel, MD in 2 weeks. Follow up with neurologist in 1-2 months.   Increase activity slowly    Complete by:  As directed     Current Discharge Medication List    START taking these medications   Details  amoxicillin (AMOXIL) 500 MG capsule Take 1 capsule (500 mg total) by mouth every 12 (twelve) hours. Take for 5 days then stop. Qty: 10 capsule, Refills: 0      CONTINUE these medications which have CHANGED   Details  carvedilol (COREG) 6.25 MG tablet Take 1 tablet (6.25 mg total) by mouth 2 (two) times daily with a meal. Qty: 62 tablet, Refills: 3    Rivaroxaban (XARELTO) 15 MG TABS tablet Take 1 tablet (15 mg total) by mouth daily with supper. Qty: 30 tablet, Refills: 2      CONTINUE these medications which have NOT CHANGED   Details  Calcium Carbonate-Vitamin D (CALCIUM-D PO) Take 1 tablet by mouth daily.    levothyroxine (SYNTHROID, LEVOTHROID) 50 MCG tablet Take 50 mcg by mouth daily.    triamterene-hydrochlorothiazide (MAXZIDE-25) 37.5-25 MG tablet Take 0.5 tablets by mouth daily.       STOP taking these medications     donepezil (ARICEPT) 10 MG tablet        Allergies  Allergen Reactions  . Amitriptyline     Urinary Retention  . Amlodipine Swelling  . Erythromycin Hives  . Nitrofurantoin Nausea Only  . Sulfa Antibiotics     "really ill"  . Tavist [Clemastine]     Urinary hesitancy  . Trimethoprim     Cramps  . Verapamil     Constipation   Follow-up Information    Horatio Pel, MD. Schedule an appointment as soon as possible for a visit in 2 week(s).   Specialty:  Internal Medicine Why:  f/u in 1-2  weeks. Contact information: [address] [place] [address] [phone]        [name], MD. Schedule an appointment as soon as possible for a visit in 1 month(s).   Specialties:  Neurology, Radiology Contact information: [address] [place] [address] [phone]            The results of significant diagnostics from this hospitalization (including imaging, microbiology, ancillary and laboratory) are listed below for reference.    Significant Diagnostic Studies: Dg Chest Portable 1 View  Result Date: 06/01/2016 CLINICAL DATA:  Chest pain and shortness of breath this morning with lightheadedness, and tibia, nausea and weakness. EXAM: PORTABLE CHEST 1 VIEW COMPARISON:  None. FINDINGS: The cardiac silhouette, mediastinal and hilar contours are within normal limits for age. There is mild tortuosity and calcification of the thoracic aorta. The lungs are clear. No pleural effusion. No pulmonary lesions. The bony thorax is intact. IMPRESSION: No acute cardiopulmonary findings. Electronically Signed   By: Marijo Sanes M.D.   On: 06/01/2016 10:31    Microbiology: Recent Results (from the past 240 hour(s))  Blood culture (routine x 2)     Status: None (Preliminary result)   Collection Time: 06/01/16 10:49 AM  Result Value Ref  Range Status   Specimen Description BLOOD RIGHT ANTECUBITAL  Final   Special Requests BOTTLES DRAWN AEROBIC AND ANAEROBIC 10CC  Final   Culture NO GROWTH 1 DAY  Final   Report Status PENDING  Incomplete  Blood culture (routine x 2)     Status: None (Preliminary result)   Collection Time: 06/01/16 11:00 AM  Result Value Ref Range Status   Specimen Description BLOOD RIGHT WRIST  Final   Special Requests BOTTLES DRAWN AEROBIC AND ANAEROBIC 5CC  Final   Culture NO GROWTH 1 DAY  Final   Report Status PENDING  Incomplete  Urine culture     Status: Abnormal   Collection Time: 06/01/16 11:24 AM  Result Value Ref  Range Status   Specimen Description URINE, RANDOM  Final   Special Requests NONE  Final   Culture >=100,000 COLONIES/mL ESCHERICHIA COLI (A)  Final   Report Status 06/03/2016 FINAL  Final   Organism ID, Bacteria ESCHERICHIA COLI (A)  Final      Susceptibility   Escherichia coli - MIC*    AMPICILLIN <=2 SENSITIVE Sensitive     CEFAZOLIN <=4 SENSITIVE Sensitive     CEFTRIAXONE <=1 SENSITIVE Sensitive     CIPROFLOXACIN <=0.25 SENSITIVE Sensitive     GENTAMICIN <=1 SENSITIVE Sensitive     IMIPENEM <=0.25 SENSITIVE Sensitive     NITROFURANTOIN <=16 SENSITIVE Sensitive     TRIMETH/SULFA <=20 SENSITIVE Sensitive     AMPICILLIN/SULBACTAM <=2 SENSITIVE Sensitive     PIP/TAZO <=4 SENSITIVE Sensitive     Extended ESBL NEGATIVE Sensitive     * >=100,000 COLONIES/mL ESCHERICHIA COLI     Labs: Basic Metabolic Panel:  Recent Labs Lab 06/01/16 0950 06/02/16 0150 06/03/16 0316  NA 133* 139 137  K 3.4* 3.3* 4.0  CL 102 102 103  CO2 23 28 23   GLUCOSE 172* 108* 109*  BUN 24* 17 15  CREATININE 1.04* 0.91 0.79  CALCIUM 9.2 9.9 8.7*   Liver Function Tests: No results for input(s): AST, ALT, ALKPHOS, BILITOT, PROT, ALBUMIN in the last 168 hours. No results for input(s): LIPASE, AMYLASE in the last 168 hours. No results for input(s): AMMONIA in the last 168 hours. CBC:  Recent Labs Lab 06/01/16 0950 06/02/16 0150  WBC 7.0 11.8*  NEUTROABS 5.2  --   HGB 12.0 12.9  HCT 37.3 40.9  MCV 93.5 94.7  PLT 209 251   Cardiac Enzymes:  Recent Labs Lab 06/01/16 1500 06/01/16 1937 06/02/16 0150  TROPONINI <0.03 <0.03 <0.03   BNP: BNP (last 3 results)  Recent Labs  06/01/16 0950  BNP 81.1    ProBNP (last 3 results) No results for input(s): PROBNP in the last 8760 hours.  CBG:  Recent Labs Lab 06/02/16 0759 06/03/16 0641  GLUCAP 119* 96       Signed:  Fredrica Capano MD.  [facility] 06/03/2016, 11:04 AM

## 2016-06-03 NOTE — Care Management Note (Signed)
Case Management Note Marvetta Gibbons RN, BSN Unit 2W-Case Manager [phone]  Patient Details  Name: Jamie Thompson MRN: [id number] Date of Birth: 07-Dec-1924  Subjective/Objective:  Pt admitted with chest pain, hypothermia, afib, UTI                   Action/Plan: PTA pt lived at [facility]- anticipate pt to return to prior living arrangement at [facility]-   Expected Discharge Date:      06/03/16           Expected Discharge Plan:  Home/Self Care  In-House Referral:     Discharge planning Services  CM Consult  Post Acute Care Choice:    Choice offered to:     DME Arranged:    DME Agency:     HH Arranged:    [place] Agency:     Status of Service:  Completed, signed off  If discussed at [employer] of Stay Meetings, dates discussed:    Additional Comments:  Dawayne Patricia, RN 06/03/2016, 11:13 AM

## 2016-06-03 NOTE — Progress Notes (Signed)
CSW spoke with patient's daughter regarding dc plan. Patient is from [place] and will return there at discharge with daughter.  CSW signing off.  Percell Locus Lige Lakeman LCSWA [phone]

## 2016-06-06 LAB — CULTURE, BLOOD (ROUTINE X 2)
Culture: NO GROWTH
Culture: NO GROWTH

## 2016-06-10 DIAGNOSIS — H903 Sensorineural hearing loss, bilateral: Secondary | ICD-10-CM | POA: Diagnosis not present

## 2016-06-10 DIAGNOSIS — I482 Chronic atrial fibrillation: Secondary | ICD-10-CM | POA: Diagnosis not present

## 2016-06-10 DIAGNOSIS — I10 Essential (primary) hypertension: Secondary | ICD-10-CM | POA: Diagnosis not present

## 2016-06-10 DIAGNOSIS — R413 Other amnesia: Secondary | ICD-10-CM | POA: Diagnosis not present

## 2016-06-10 DIAGNOSIS — Z23 Encounter for immunization: Secondary | ICD-10-CM | POA: Diagnosis not present

## 2016-07-09 DIAGNOSIS — R413 Other amnesia: Secondary | ICD-10-CM | POA: Diagnosis not present

## 2016-07-09 DIAGNOSIS — I10 Essential (primary) hypertension: Secondary | ICD-10-CM | POA: Diagnosis not present

## 2016-07-09 DIAGNOSIS — H9190 Unspecified hearing loss, unspecified ear: Secondary | ICD-10-CM | POA: Diagnosis not present

## 2016-07-09 DIAGNOSIS — R922 Inconclusive mammogram: Secondary | ICD-10-CM | POA: Diagnosis not present

## 2016-07-30 ENCOUNTER — Ambulatory Visit (INDEPENDENT_AMBULATORY_CARE_PROVIDER_SITE_OTHER): Payer: Medicare Other | Admitting: Neurology

## 2016-07-30 ENCOUNTER — Encounter: Payer: Self-pay | Admitting: Neurology

## 2016-07-30 VITALS — BP 160/62 | HR 64 | Resp 14 | Ht 65.0 in | Wt 128.0 lb

## 2016-07-30 DIAGNOSIS — G3184 Mild cognitive impairment, so stated: Secondary | ICD-10-CM

## 2016-07-30 NOTE — Patient Instructions (Addendum)
You have been able to tolerate the memory medication.  I agree with your transitioning to assistant living.  Please avoid tea, coffee or soda after lunch.  Try to exercise more.  Let's do a 4 month follow up with [facility] and I will see you back after that.

## 2016-07-30 NOTE — Progress Notes (Signed)
Subjective:    Patient ID: Jamie Thompson is a 82 y.o. female.  HPI     Interim history:   Jamie Thompson is an 82 year old right-handed woman with an underlying medical history of A. fib, hypertension, hyperlipidemia, Mnire's disease with bilateral hearing loss, anxiety, hypothyroidism, diverticulosis, status post right cataract extraction, allergic rhinitis, and vitamin D deficiency, who presents for follow-up consultation of her memory loss. The patient is accompanied by her son-in-law today. I last saw her on 01/28/2016, at which time her son-in-law noted more confusion, gradually with progression. I initiated Aricept low-dose generic. She was then seen in the interim by our nurse practitioner, Ms. Clabe Seal on 04/07/2016, at which time her MMSE was 27 and donepezil was increased to 10 mg once daily. I reviewed the neuropsychological test results from 01/27/2016, when she saw Dr. Valentina Shaggy:  Diagnostic Impression Mild Cognitive impairment, amnestic type, multiple domains [G31.84]   Recommendations 1. Assuming continued cognitive decline, it may be necessary for her to move into assisted living within the coming year. Family should have regular contact with staff at her independent living center to help make this decision.    2. Given her memory loss, she requires oversight of medication administration and bill paying. Her son-in-law stated that he is available to do this on a daily basis.   3. It is recommended that she undergo a repeat neuropsychological evaluation in about one year using these test results as a baseline to track for interval changes, if any, in her cognitive functioning.   Today, 07/30/2016: She reports doing okay. Per son in law she is not very motivated to exercise. Appetite is good, weight stable. She was in the hospital recently from 06/01/2016 through 06/03/2016 secondary to shortness of breath, chest pain, and vomiting reported. She had cardiac workup which was negative  for any acute cardiac event, she was diagnosed with UTI.  Previously:   I saw her on 10/30/2015 at which time she reported for a sooner than scheduled appointment because of worsening memory issues noted by her other daughter who had recently visited the patient. She had more forgetfulness, lumbar mood irritability, does not always drinking enough water. She was living in independent living but there have been some instances where the staff had to redirect her to her own apartment. She had a near fall or controlled fall rather about 3 weeks prior. She had seen her primary care physician and had blood work which per son-in-law was fine including urinalysis. Her MMSE was 29/30, CDT: 4/4, AFT: 12/min. I suggested we proceed with formal neuropsychological testing. She had this evaluation with Dr. Valentina Shaggy on 01/27/2016.   She presents for a sooner than scheduled appointment because of worsening memory loss. I last saw her on 07/01/2015, at which time she reported doing well. Her son-in-law had observed her driving and was satisfied with her driving. She was mildly forgetful, she had not fallen. She had a good appetite. Her MMSE was 27 out of 30 at the time, clock drawing was 3 out of 4, animal fluency 10/m. We mutually agreed to monitor her symptoms and not start her on medication for memory loss at the time.  I first met her on 02/26/2015 at the request of her primary care physician, at which time she reported a several month history of short-term memory loss. Her MMSE was 24 out of 30, clock drawing was 3 out of 4, animal fluency was 9/m, depression score was 0 out of 15 at the time of  her first visit with me. She had had a prior brain MRI several years ago and I suggested we proceed with another MRI to compare findings. I did not suggest any new medications and she recently had had blood work through her primary care physician. We talked about her driving and I asked her daughter to observe her driving. I  suggested the patient stay on local roads, avoid highways and Interstates and avoid driving at night. She had a brain MRI without contrast on 03/09/2015: Ordinary age related volume loss. Minimal small vessel change of the cerebral hemispheric white matter, less than often seen in healthy individuals of this age. No acute or reversible finding. No specific cause of the presenting symptoms is identified. Since the previous study, atrophy is slightly progressive as would be expected.  Findings were compared to a previous brain MRI from 03/02/2005. In addition, I personally reviewed the images through the PACS system. We called her with her test results  02/26/2015: She reports short-term memory issues for the past few months. She reported some forgetfulness, misplacing things, and forgetting conversations. She still drives. She drives locally for the most part. She has not had any issues driving but her daughter has not recently observed her driving. Per daughter, she has had a 6-9 month history of forgetfulness, misplacing things, forgetting events or conversations. Familiar faces are easily recognized. She has no evidence of paranoia, no other delusions, no delusions, no hallucinations, and no personality changes or behavioral changes. [facility]. She has 2 daughters, Jamie Thompson his local and her other daughter, Jamie Thompson, is in [place], [place]. She does not have a strong family history of dementia but a maternal aunt had Alzheimer's disease. Her mother lived to be 32 years old, her father died at the age of 31. She is a lifelong nonsmoker and does not currently drink any alcohol. She has never had any heavy alcohol use. She does not use illicit drugs. She sleeps fairly well. She has not had any confusion or disorientation. She tries to walk regularly. She goes to the dining room once a day for lunch. She eats fairly well. I reviewed your office note from 02/12/2015 which  you kindly included. Recent blood work from 02/06/2015 showed normal liver function, cholesterol of 148, triglycerides of 57, LDL of 84. She had a brain MRI with and without contrast on 03/02/2005: 1.  No internal auditory canal enhancing lesion or acute infarct. 2.  Mild age related atrophy.  Mild nonspecific white matter changes may be related to sequela of small vessel disease. 3.  Cervical spondylotic changes with spinal stenosis C3-4 and C4-5 incompletely evaluated on the present exam.   In addition, I personally reviewed the images through the PACS system.  Her Past Medical History Is Significant For: Past Medical History:  Diagnosis Date  . Adenomatous colon polyp 07/17/97  . Allergic rhinitis   . Allergy   . Anxiety disorder   . Atrial fibrillation ([place])   . Chronic anticoagulation   . Diverticulosis   . History of shingles   . Hypercholesteremia   . Hypertension   . Hypothyroidism   . Internal hemorrhoids   . Melanoma ([place])   . Memory loss   . Meniere disease   . Tension headache     Her Past Surgical History Is Significant For: Past Surgical History:  Procedure Laterality Date  . CATARACT EXTRACTION Right 2012    Her Family History Is Significant For: Family History  Problem Relation Age of Onset  . Stroke Father   . Clotting disorder Father   . Colon cancer Mother   . Colon cancer Sister   . Clotting disorder Sister     Her Social History Is Significant For: Social History   Social History  . Marital status: Single    Spouse name: N/A  . Number of children: 2  . Years of education: 41yrcolg   Occupational History  . Retired    Social History Main Topics  . Smoking status: Never Smoker  . Smokeless tobacco: Never Used  . Alcohol use No  . Drug use: No  . Sexual activity: Not Asked   Other Topics Concern  . None   Social History Narrative   1 cup of coffee a day, occasionally drinks tea or soda    Her Allergies Are:  Allergies  Allergen  Reactions  . Amitriptyline     Urinary Retention  . Amlodipine Swelling  . Erythromycin Hives  . Nitrofurantoin Nausea Only  . Sulfa Antibiotics     "really ill"  . Tavist [Clemastine]     Urinary hesitancy  . Trimethoprim     Cramps  . Verapamil     Constipation  :   Her Current Medications Are:  Outpatient Encounter Prescriptions as of 07/30/2016  Medication Sig  . amLODipine (NORVASC) 2.5 MG tablet   . Calcium Carbonate-Vitamin D (CALCIUM-D PO) Take 1 tablet by mouth daily.  . carvedilol (COREG) 6.25 MG tablet Take 1 tablet (6.25 mg total) by mouth 2 (two) times daily with a meal.  . donepezil (ARICEPT) 10 MG tablet   . levothyroxine (SYNTHROID, LEVOTHROID) 50 MCG tablet Take 50 mcg by mouth daily.  . Rivaroxaban (XARELTO) 15 MG TABS tablet Take 1 tablet (15 mg total) by mouth daily with supper.  . triamterene-hydrochlorothiazide (MAXZIDE-25) 37.5-25 MG tablet Take 0.5 tablets by mouth daily.    No facility-administered encounter medications on file as of 07/30/2016.   :  Review of Systems:  Out of a complete 14 point review of systems, all are reviewed and negative with the exception of these symptoms as listed below: Review of Systems  Neurological:       Son-in-law feels that patient's "state of confusion" is increasing. Patient will go to an assistant living facility at the beginning of December.     Objective:  Neurologic Exam  Physical Exam Physical Examination:   Vitals:   07/30/16 1033  BP: (!) 160/62  Pulse: 64  Resp: 14   General Examination: The patient is a very pleasant 82y.o. female in no acute distress. She is calm and cooperative with the exam. She denies Auditory Hallucinations and Visual Hallucinations. She is very well groomed and situated in a chair. She is in good spirits today. Hard of hearing.   HEENT: Normocephalic, atraumatic, pupils are equal, round and reactive to light and accommodation. Extraocular tracking shows no saccadic breakdown  without nystagmus noted. Hearing is impaired with no hearing aids in place today. Face is symmetric with no facial masking and normal facial sensation. There is no lip, neck or jaw tremor. Neck is not rigid with intact passive ROM. There are no carotid bruits on auscultation. Oropharynx exam reveals mild mouth dryness. No significant airway crowding is noted. Mallampati is class II. Tongue protrudes centrally and palate elevates symmetrically.    Chest: is clear to auscultation without wheezing, rhonchi or crackles noted.  Heart: sounds are regular and normal without murmurs, rubs or  gallops noted.   Abdomen: is soft, non-tender and non-distended with normal bowel sounds appreciated on auscultation.  Extremities: There is no pitting edema in the distal lower extremities bilaterally. Pedal pulses are intact.   Skin: is warm and dry with no trophic changes noted. Age-related changes are noted on the skin, skin turgor mildly reduced.   Musculoskeletal: exam reveals no obvious joint deformities, tenderness or joint swelling or erythema. Mild changes consistent with OA of the hands are noted bilaterally.   Neurologically:  Mental status: The patient is awake and alert, paying good attention. She is able to provide a portion of the history. Details are provided by Tim, he does have a tendency to talk for her. She is oriented to: person, place, time/date, situation, day of week, month of year and year. Her memory, attention, language and knowledge are impaired mildly. There is no aphasia, agnosia, apraxia or anomia. There is a mild degree of bradyphrenia. Speech is not hypophonic with no dysarthria noted. Mood is congruent and affect is normal.   On 02/26/2015: Her MMSE (Mini-Mental state exam) score is 24/30. CDT (Clock Drawing Test) score is 3/4. AFT (Animal Fluency Test) score is 9. Geriatric Depression Scale Score is 0.   On 07/01/2015: MMSE: 27/30, CDT: 3/4, AFT: 10/min.  On 10/30/2015: MMSE: 29/30,  CDT: 4/4, AFT: 12/min.  On 04/07/16: MMSE: 27/30.  Cranial nerves are as described above under HEENT exam. In addition, shoulder shrug is normal with equal shoulder height noted.  Motor exam: Normal bulk, and strength for age is noted. Tone is not rigid with absence of cogwheeling.  Findings motor skills are intact. There is no resting tremor or postural or action tremor. Finger to nose testing and heel-to-shin are normal bilaterally. Reflexes are 2+ in the upper extremities and 1+ in the lower extremities.   Sensory exam is intact to light touch in the upper and lower extremities.   Gait, station and balance: She stands up from the seated position with no difficulty and needs no assistance. Posture is age-appropriate. Stance is slightly cautious and slow. Tandem walk is not possible for her.  Assessment and Plan:   In summary, Jamie Thompson is a very pleasant 82 year old female with an underlying medical history of A. Fib on coumadin, hypertension, hyperlipidemia, Mnire's disease with bilateral hearing loss and b/l hearing aids in place, anxiety, hypothyroidism, diverticulosis, status post right cataract extraction, allergic rhinitis, and vitamin D deficiency, who presents for follow-up consultation of her memory loss, mild dementia. She had neuropsychological testing on 01/27/2016 with results in keeping with mild cognitive impairment, concern for early Alzheimer's disease. She was started on generic Aricept 5 mg once daily in May 2017 and this was increased to 10 mg once daily in July 2017 when she saw our nurse practitioner. She has been able to tolerate this. She is transitioning from independent living to assisted living [facility]. She has a good support system.  Brain MRI from 03/09/2015 showed age-related findings and mild progression of her atrophy compared to an MRI from 2006. Today, I suggested we continue with Aricept generic, 10 mg strength once daily. I talked to her  and her son-in-law at length again today. They are advised to follow up in 4 months with Jinny Blossom, NP, and I will see her back after that.  She is advised to stay active physically, not to skip any meals and stay better hydrated with water.  I answered all their questions today and the  patient and her son-in-law were in agreement.  I spent 25 minutes in total face-to-face time with the patient, more than 50% of which was spent in counseling and coordination of care, reviewing test results, reviewing medication and discussing or reviewing the diagnosis of memory loss, its prognosis and treatment options.

## 2016-08-17 DIAGNOSIS — I10 Essential (primary) hypertension: Secondary | ICD-10-CM | POA: Diagnosis not present

## 2016-09-01 DIAGNOSIS — R410 Disorientation, unspecified: Secondary | ICD-10-CM | POA: Diagnosis not present

## 2016-09-01 DIAGNOSIS — F0390 Unspecified dementia without behavioral disturbance: Secondary | ICD-10-CM | POA: Diagnosis not present

## 2016-09-16 DIAGNOSIS — Z0000 Encounter for general adult medical examination without abnormal findings: Secondary | ICD-10-CM | POA: Diagnosis not present

## 2016-09-16 DIAGNOSIS — N390 Urinary tract infection, site not specified: Secondary | ICD-10-CM | POA: Diagnosis not present

## 2016-09-16 DIAGNOSIS — I10 Essential (primary) hypertension: Secondary | ICD-10-CM | POA: Diagnosis not present

## 2016-09-16 DIAGNOSIS — E039 Hypothyroidism, unspecified: Secondary | ICD-10-CM | POA: Diagnosis not present

## 2016-09-16 DIAGNOSIS — Z7901 Long term (current) use of anticoagulants: Secondary | ICD-10-CM | POA: Diagnosis not present

## 2016-09-16 DIAGNOSIS — R410 Disorientation, unspecified: Secondary | ICD-10-CM | POA: Diagnosis not present

## 2016-09-16 DIAGNOSIS — E7800 Pure hypercholesterolemia, unspecified: Secondary | ICD-10-CM | POA: Diagnosis not present

## 2016-09-23 DIAGNOSIS — R413 Other amnesia: Secondary | ICD-10-CM | POA: Diagnosis not present

## 2016-09-23 DIAGNOSIS — K5730 Diverticulosis of large intestine without perforation or abscess without bleeding: Secondary | ICD-10-CM | POA: Diagnosis not present

## 2016-09-23 DIAGNOSIS — H9190 Unspecified hearing loss, unspecified ear: Secondary | ICD-10-CM | POA: Diagnosis not present

## 2016-09-23 DIAGNOSIS — Z86010 Personal history of colonic polyps: Secondary | ICD-10-CM | POA: Diagnosis not present

## 2016-10-01 DIAGNOSIS — I10 Essential (primary) hypertension: Secondary | ICD-10-CM | POA: Diagnosis not present

## 2016-10-01 DIAGNOSIS — H8109 Meniere's disease, unspecified ear: Secondary | ICD-10-CM | POA: Diagnosis not present

## 2016-10-01 DIAGNOSIS — Z7901 Long term (current) use of anticoagulants: Secondary | ICD-10-CM | POA: Diagnosis not present

## 2016-10-01 DIAGNOSIS — R41841 Cognitive communication deficit: Secondary | ICD-10-CM | POA: Diagnosis not present

## 2016-10-01 DIAGNOSIS — G3184 Mild cognitive impairment, so stated: Secondary | ICD-10-CM | POA: Diagnosis not present

## 2016-10-01 DIAGNOSIS — I4891 Unspecified atrial fibrillation: Secondary | ICD-10-CM | POA: Diagnosis not present

## 2016-10-01 DIAGNOSIS — E039 Hypothyroidism, unspecified: Secondary | ICD-10-CM | POA: Diagnosis not present

## 2016-10-05 DIAGNOSIS — I10 Essential (primary) hypertension: Secondary | ICD-10-CM | POA: Diagnosis not present

## 2016-10-05 DIAGNOSIS — H8109 Meniere's disease, unspecified ear: Secondary | ICD-10-CM | POA: Diagnosis not present

## 2016-10-05 DIAGNOSIS — Z7901 Long term (current) use of anticoagulants: Secondary | ICD-10-CM | POA: Diagnosis not present

## 2016-10-05 DIAGNOSIS — G3184 Mild cognitive impairment, so stated: Secondary | ICD-10-CM | POA: Diagnosis not present

## 2016-10-05 DIAGNOSIS — I4891 Unspecified atrial fibrillation: Secondary | ICD-10-CM | POA: Diagnosis not present

## 2016-10-05 DIAGNOSIS — R41841 Cognitive communication deficit: Secondary | ICD-10-CM | POA: Diagnosis not present

## 2016-10-14 DIAGNOSIS — R41841 Cognitive communication deficit: Secondary | ICD-10-CM | POA: Diagnosis not present

## 2016-10-14 DIAGNOSIS — Z7901 Long term (current) use of anticoagulants: Secondary | ICD-10-CM | POA: Diagnosis not present

## 2016-10-14 DIAGNOSIS — G3184 Mild cognitive impairment, so stated: Secondary | ICD-10-CM | POA: Diagnosis not present

## 2016-10-14 DIAGNOSIS — I4891 Unspecified atrial fibrillation: Secondary | ICD-10-CM | POA: Diagnosis not present

## 2016-10-14 DIAGNOSIS — H8109 Meniere's disease, unspecified ear: Secondary | ICD-10-CM | POA: Diagnosis not present

## 2016-10-14 DIAGNOSIS — I10 Essential (primary) hypertension: Secondary | ICD-10-CM | POA: Diagnosis not present

## 2016-10-29 DIAGNOSIS — E039 Hypothyroidism, unspecified: Secondary | ICD-10-CM | POA: Diagnosis not present

## 2016-11-02 ENCOUNTER — Encounter: Payer: Self-pay | Admitting: *Deleted

## 2016-11-05 ENCOUNTER — Encounter: Payer: Self-pay | Admitting: Nurse Practitioner

## 2016-11-05 ENCOUNTER — Ambulatory Visit: Payer: Medicare Other | Admitting: Nurse Practitioner

## 2016-11-05 DIAGNOSIS — N183 Chronic kidney disease, stage 3 (moderate): Secondary | ICD-10-CM

## 2016-11-05 DIAGNOSIS — I130 Hypertensive heart and chronic kidney disease with heart failure and stage 1 through stage 4 chronic kidney disease, or unspecified chronic kidney disease: Secondary | ICD-10-CM | POA: Insufficient documentation

## 2016-11-05 DIAGNOSIS — I10 Essential (primary) hypertension: Secondary | ICD-10-CM

## 2016-11-05 DIAGNOSIS — I482 Chronic atrial fibrillation, unspecified: Secondary | ICD-10-CM

## 2016-11-05 DIAGNOSIS — E039 Hypothyroidism, unspecified: Secondary | ICD-10-CM

## 2016-11-05 DIAGNOSIS — F0390 Unspecified dementia without behavioral disturbance: Secondary | ICD-10-CM

## 2016-11-05 NOTE — Progress Notes (Signed)
Provider:  Marlana Latus NP  Location:   Clinic [facility]   Place of Service:   Clinic  PCP: Jeanmarie Hubert, MD Patient Care Team: Estill Dooms, MD as PCP - General (Internal Medicine) Kamaria Lucia Otho Darner, NP as Nurse Practitioner (Internal Medicine) [name], MD as Attending Physician (Neurology)  Extended Emergency Contact Information Primary Emergency Contact: Key,Caroline Address: [place] Phone: [phone] Mobile Phone: [phone] Relation: Daughter Secondary Emergency Contact: Key,Tim  [place] Mobile Phone: [phone] Relation: Son  Code Status: DNR Goals of Care: Advanced Directive information Advanced Directives 11/05/2016  Does Patient Have a Medical Advance Directive? No  Would patient like information on creating a medical advance directive? No - Patient declined      Chief Complaint  Patient presents with  . Establish Care    HPI: Patient is a 83 y.o. female seen today for admission to [facility] clinic. Hx of HTN controlled Norvasc 2.5mg , Carvedilol 6.25mg  bid,  Maxzide 37.5-25mg , 1/2 tab daily. Afib, heart rate is in control, taking Norvasc 2.5mg  daily, Carvedilol 6.25mg  bid, Xarelto 15mg  daily. Dementia, taking Aricept, recently admitted to [place] for care assistance.   Past Medical History:  Diagnosis Date  . Adenomatous colon polyp 07/17/97  . Allergic rhinitis   . Allergy   . Anxiety disorder   . Atrial fibrillation ([place])   . Chronic anticoagulation   . Diverticulosis   . History of shingles   . Hypercholesteremia   . Hypertension   . Hypothyroidism   . Internal hemorrhoids   . Melanoma ([place])   . Memory loss   . Meniere disease   . Tension headache    Past Surgical History:  Procedure Laterality Date  . CATARACT EXTRACTION Right 2012    reports that she has never smoked. She has never used smokeless tobacco. She reports that she does not drink alcohol or use drugs. Social  History   Social History  . Marital status: Single    Spouse name: N/A  . Number of children: 2  . Years of education: 63yr colg   Occupational History  . Retired    Social History Main Topics  . Smoking status: Never Smoker  . Smokeless tobacco: Never Used  . Alcohol use No  . Drug use: No  . Sexual activity: Not on file   Other Topics Concern  . Not on file   Social History Narrative   1 cup of coffee a day, occasionally drinks tea or soda   Social History     Marital status: Divorced             Spouse name:                        Years of education:        Number of children: 2               Occupational History   Occupation : Secretary/administrator                Retired  Social History Main Topics     Smoking status: Never Smoker                                                                  Smokeless tobacco: Never Used                         Alcohol use: No               Drug use: No               Sexual activity: Not on file            Other Topics            Concern     None on file      Social History Narrative     1 cup of coffee a day, occasionally drinks tea or soda.   She does not exercise, has a living will, DNR and POA             Functional Status Survey:    Family History  Problem Relation Age of Onset  . Stroke Father   . Clotting disorder Father   . Colon cancer Mother   . Colon cancer Sister   . Clotting disorder Sister     Health Maintenance  Topic Date Due  . Samul Dada  04/28/1944  . ZOSTAVAX  04/29/1987  . DEXA SCAN  04/28/1992  . PNA vac Low Risk Adult (1 of 2 - PCV13) 04/28/1992  . INFLUENZA VACCINE  04/28/2016    Allergies  Allergen Reactions  . Amitriptyline     Urinary Retention  . Amlodipine Swelling  . Erythromycin Hives  . Nitrofurantoin Nausea Only  . Sulfa Antibiotics     "really ill"  . Tavist [Clemastine]     Urinary hesitancy  .  Trimethoprim     Cramps  . Verapamil     Constipation    Allergies as of 11/05/2016      Reactions   Amitriptyline    Urinary Retention   Amlodipine Swelling   Erythromycin Hives   Nitrofurantoin Nausea Only   Sulfa Antibiotics    "really ill"   Tavist [clemastine]    Urinary hesitancy   Trimethoprim    Cramps   Verapamil    Constipation      Medication List       Accurate as of 11/05/16  3:42 PM. Always use your most recent med list.          amLODipine 2.5 MG tablet Commonly known as:  NORVASC   CALCIUM-D PO Take 1 tablet by mouth daily.   carvedilol 6.25 MG tablet Commonly known as:  COREG Take 1 tablet (6.25 mg total) by mouth 2 (two) times daily with a meal.   donepezil 10 MG tablet Commonly known as:  ARICEPT   levothyroxine 50 MCG tablet Commonly known as:  SYNTHROID, LEVOTHROID Take 50 mcg by mouth daily.   Rivaroxaban 15 MG Tabs tablet Commonly known as:  XARELTO Take 1 tablet (15 mg total) by mouth daily with supper.   triamterene-hydrochlorothiazide 37.5-25 MG tablet Commonly known as:  MAXZIDE-25 Take 0.5 tablets by mouth daily.       Review  of Systems  Constitutional: Negative for activity change, appetite change, chills, diaphoresis, fatigue and fever.  HENT: Positive for hearing loss. Negative for congestion, dental problem, drooling, ear discharge, ear pain, facial swelling, mouth sores, nosebleeds, postnasal drip, rhinorrhea, sinus pain, sinus pressure, sneezing, sore throat, tinnitus, trouble swallowing and voice change.        HOH, hearing aid left  Eyes: Negative for photophobia, pain, discharge, redness, itching and visual disturbance.  Respiratory: Negative for apnea, cough, choking, chest tightness, shortness of breath, wheezing and stridor.   Cardiovascular: Negative for chest pain, palpitations and leg swelling.  Gastrointestinal: Negative for abdominal distention, abdominal pain, anal bleeding, blood in stool, constipation,  diarrhea, nausea and vomiting.  Endocrine: Negative for cold intolerance, heat intolerance, polydipsia, polyphagia and polyuria.  Genitourinary: Negative for difficulty urinating, dysuria, flank pain, frequency, pelvic pain and urgency.  Musculoskeletal: Positive for gait problem. Negative for arthralgias, back pain, joint swelling, myalgias and neck pain.  Skin: Negative for color change, pallor, rash and wound.  Allergic/Immunologic: Negative for environmental allergies, food allergies and immunocompromised state.  Neurological: Negative for dizziness, tremors, seizures, syncope, facial asymmetry, speech difficulty, weakness, light-headedness, numbness and headaches.  Hematological: Negative for adenopathy. Bruises/bleeds easily.  Psychiatric/Behavioral: Positive for confusion and decreased concentration. Negative for agitation, dysphoric mood, hallucinations, sleep disturbance and suicidal ideas. The patient is not nervous/anxious and is not hyperactive.     Vitals:   11/05/16 1435  BP: (!) 150/70  Pulse: 89  Temp: 97.4 F (36.3 C)  SpO2: 98%  Weight: 139 lb 4.8 oz (63.2 kg)   Body mass index is 23.18 kg/m. Physical Exam  Constitutional: She appears well-developed and well-nourished. No distress.  HENT:  Head: Normocephalic and atraumatic.  Right Ear: External ear normal.  Left Ear: External ear normal.  Mouth/Throat: Oropharynx is clear and moist.  Eyes: EOM are normal. Pupils are equal, round, and reactive to light. Right eye exhibits no discharge. Left eye exhibits no discharge. No scleral icterus.  Neck: Normal range of motion. Neck supple. No JVD present. No tracheal deviation present. No thyromegaly present.  Cardiovascular: Normal rate, regular rhythm and intact distal pulses.  Exam reveals no gallop and no friction rub.   No murmur heard. Afib, heart rate is in control.   Pulmonary/Chest: Effort normal and breath sounds normal. No stridor. No respiratory distress. She has  no wheezes. She has no rales.  Abdominal: Soft. Bowel sounds are normal. She exhibits no distension. There is no tenderness. There is no rebound and no guarding.  Musculoskeletal: Normal range of motion. She exhibits no edema, tenderness or deformity.  Neurological: She is alert. She has normal reflexes. She displays normal reflexes. A cranial nerve deficit is present. She exhibits normal muscle tone. Coordination normal.  Skin: Skin is warm and dry. She is not diaphoretic.  Psychiatric:  Dementia, confusion, irritated when unable to remember things.     Labs reviewed: Basic Metabolic Panel:  Recent Labs  06/01/16 0950 06/02/16 0150 06/03/16 0316  NA 133* 139 137  K 3.4* 3.3* 4.0  CL 102 102 103  CO2 23 28 23   GLUCOSE 172* 108* 109*  BUN 24* 17 15  CREATININE 1.04* 0.91 0.79  CALCIUM 9.2 9.9 8.7*   Liver Function Tests: No results for input(s): AST, ALT, ALKPHOS, BILITOT, PROT, ALBUMIN in the last 8760 hours. No results for input(s): LIPASE, AMYLASE in the last 8760 hours. No results for input(s): AMMONIA in the last 8760 hours. CBC:  Recent Labs  06/01/16  0950 06/02/16 0150  WBC 7.0 11.8*  NEUTROABS 5.2  --   HGB 12.0 12.9  HCT 37.3 40.9  MCV 93.5 94.7  PLT 209 251   Cardiac Enzymes:  Recent Labs  06/01/16 1500 06/01/16 1937 06/02/16 0150  TROPONINI <0.03 <0.03 <0.03   BNP: Invalid input(s): POCBNP No results found for: HGBA1C Lab Results  Component Value Date   TSH 4.412 06/01/2016   No results found for: VITAMINB12 No results found for: FOLATE No results found for: IRON, TIBC, FERRITIN  Imaging and Procedures obtained prior to SNF admission: Dg Chest Portable 1 View  Result Date: 06/01/2016 CLINICAL DATA:  Chest pain and shortness of breath this morning with lightheadedness, and tibia, nausea and weakness. EXAM: PORTABLE CHEST 1 VIEW COMPARISON:  None. FINDINGS: The cardiac silhouette, mediastinal and hilar contours are within normal limits for age.  There is mild tortuosity and calcification of the thoracic aorta. The lungs are clear. No pleural effusion. No pulmonary lesions. The bony thorax is intact. IMPRESSION: No acute cardiopulmonary findings. Electronically Signed   By: Marijo Sanes M.D.   On: 06/01/2016 10:31    Assessment/Plan  Dementia 10/29/16 TSH 1.58, continue Aricept, obtain MMSE  Atrial fibrillation (HCC) Heart rate is in control, continue Amlodipine 2.5mg , Carvedilol 6.25mg  bid, Xarelto 15mg  daily.   Hypothyroidism 10/29/16 TSH 1.58 Continue Levothyroxine 56mcg daily.   HTN (hypertension) Controlled, continue Norvasc 2.5mg , Carvedilol 6.25mg  bid,  Maxzide 37.5-25mg , 1/2 tab daily, update CBC CMP Hgb a1c lipid panel.   Family/ staff Communication: AL  Labs/tests ordered: CBC,  CMP,  Hgb a1c, lipid panel

## 2016-11-05 NOTE — Assessment & Plan Note (Signed)
10/29/16 TSH 1.58 Continue Levothyroxine 50mcg daily.  

## 2016-11-05 NOTE — Assessment & Plan Note (Signed)
10/29/16 TSH 1.58, continue Aricept, obtain MMSE

## 2016-11-05 NOTE — Assessment & Plan Note (Signed)
Controlled, continue Norvasc 2.5mg , Carvedilol 6.25mg  bid,  Maxzide 37.5-25mg , 1/2 tab daily, update CBC CMP Hgb a1c lipid panel.

## 2016-11-05 NOTE — Assessment & Plan Note (Signed)
Heart rate is in control, continue Amlodipine 2.5mg , Carvedilol 6.25mg  bid, Xarelto 15mg  daily.

## 2016-11-10 DIAGNOSIS — E7800 Pure hypercholesterolemia, unspecified: Secondary | ICD-10-CM | POA: Diagnosis not present

## 2016-11-10 DIAGNOSIS — I10 Essential (primary) hypertension: Secondary | ICD-10-CM | POA: Diagnosis not present

## 2016-11-10 LAB — LIPID PANEL
Cholesterol: 200 mg/dL (ref 0–200)
HDL: 63 mg/dL (ref 35–70)
LDL CALC: 121 mg/dL
LDl/HDL Ratio: 3.2
Triglycerides: 81 mg/dL (ref 40–160)

## 2016-11-10 LAB — BASIC METABOLIC PANEL
BUN: 19 mg/dL (ref 4–21)
CREATININE: 1 mg/dL (ref <=1.1)
GLUCOSE: 96 mg/dL
POTASSIUM: 4.2 mmol/L (ref 3.4–5.3)
Sodium: 140 mmol/L (ref 137–147)

## 2016-11-10 LAB — HEPATIC FUNCTION PANEL
ALK PHOS: 79 U/L (ref 25–125)
ALT: 13 U/L (ref 7–35)
AST: 17 U/L (ref 13–35)
BILIRUBIN, TOTAL: 0.5 mg/dL

## 2016-11-10 LAB — CBC AND DIFFERENTIAL
HCT: 39 % (ref 36–46)
Hemoglobin: 13.1 g/dL (ref 12.0–16.0)
PLATELETS: 288 10*3/uL (ref 150–399)
WBC: 7.7 10*3/mL

## 2016-11-10 LAB — HEMOGLOBIN A1C: HEMOGLOBIN A1C: 5.7

## 2016-11-11 ENCOUNTER — Other Ambulatory Visit: Payer: Self-pay | Admitting: *Deleted

## 2016-11-11 ENCOUNTER — Encounter: Payer: Self-pay | Admitting: *Deleted

## 2016-11-15 ENCOUNTER — Emergency Department (HOSPITAL_COMMUNITY): Payer: Medicare Other

## 2016-11-15 ENCOUNTER — Inpatient Hospital Stay (HOSPITAL_COMMUNITY)
Admission: EM | Admit: 2016-11-15 | Discharge: 2016-11-17 | DRG: 184 | Disposition: A | Discharge status: Home Health | Payer: Medicare Other | Attending: Internal Medicine | Admitting: Internal Medicine

## 2016-11-15 ENCOUNTER — Encounter (HOSPITAL_COMMUNITY): Payer: Self-pay | Admitting: *Deleted

## 2016-11-15 DIAGNOSIS — E785 Hyperlipidemia, unspecified: Secondary | ICD-10-CM | POA: Diagnosis present

## 2016-11-15 DIAGNOSIS — Z79899 Other long term (current) drug therapy: Secondary | ICD-10-CM | POA: Diagnosis not present

## 2016-11-15 DIAGNOSIS — J90 Pleural effusion, not elsewhere classified: Secondary | ICD-10-CM | POA: Diagnosis not present

## 2016-11-15 DIAGNOSIS — E039 Hypothyroidism, unspecified: Secondary | ICD-10-CM | POA: Diagnosis not present

## 2016-11-15 DIAGNOSIS — D6832 Hemorrhagic disorder due to extrinsic circulating anticoagulants: Secondary | ICD-10-CM | POA: Diagnosis not present

## 2016-11-15 DIAGNOSIS — F0390 Unspecified dementia without behavioral disturbance: Secondary | ICD-10-CM | POA: Diagnosis not present

## 2016-11-15 DIAGNOSIS — N390 Urinary tract infection, site not specified: Secondary | ICD-10-CM | POA: Diagnosis not present

## 2016-11-15 DIAGNOSIS — Z85820 Personal history of malignant melanoma of skin: Secondary | ICD-10-CM

## 2016-11-15 DIAGNOSIS — N183 Chronic kidney disease, stage 3 unspecified: Secondary | ICD-10-CM | POA: Diagnosis present

## 2016-11-15 DIAGNOSIS — M542 Cervicalgia: Secondary | ICD-10-CM | POA: Diagnosis not present

## 2016-11-15 DIAGNOSIS — S2232XA Fracture of one rib, left side, initial encounter for closed fracture: Secondary | ICD-10-CM | POA: Diagnosis not present

## 2016-11-15 DIAGNOSIS — I130 Hypertensive heart and chronic kidney disease with heart failure and stage 1 through stage 4 chronic kidney disease, or unspecified chronic kidney disease: Secondary | ICD-10-CM | POA: Diagnosis present

## 2016-11-15 DIAGNOSIS — S199XXA Unspecified injury of neck, initial encounter: Secondary | ICD-10-CM | POA: Diagnosis not present

## 2016-11-15 DIAGNOSIS — Y92009 Unspecified place in unspecified non-institutional (private) residence as the place of occurrence of the external cause: Secondary | ICD-10-CM

## 2016-11-15 DIAGNOSIS — I10 Essential (primary) hypertension: Secondary | ICD-10-CM | POA: Diagnosis present

## 2016-11-15 DIAGNOSIS — I712 Thoracic aortic aneurysm, without rupture: Secondary | ICD-10-CM | POA: Diagnosis present

## 2016-11-15 DIAGNOSIS — S3991XA Unspecified injury of abdomen, initial encounter: Secondary | ICD-10-CM | POA: Diagnosis not present

## 2016-11-15 DIAGNOSIS — I4891 Unspecified atrial fibrillation: Secondary | ICD-10-CM | POA: Diagnosis not present

## 2016-11-15 DIAGNOSIS — R079 Chest pain, unspecified: Secondary | ICD-10-CM | POA: Diagnosis not present

## 2016-11-15 DIAGNOSIS — S2239XA Fracture of one rib, unspecified side, initial encounter for closed fracture: Secondary | ICD-10-CM | POA: Diagnosis present

## 2016-11-15 DIAGNOSIS — W01198A Fall on same level from slipping, tripping and stumbling with subsequent striking against other object, initial encounter: Secondary | ICD-10-CM | POA: Diagnosis present

## 2016-11-15 DIAGNOSIS — S2242XA Multiple fractures of ribs, left side, initial encounter for closed fracture: Secondary | ICD-10-CM | POA: Diagnosis not present

## 2016-11-15 DIAGNOSIS — R51 Headache: Secondary | ICD-10-CM | POA: Diagnosis not present

## 2016-11-15 DIAGNOSIS — N3001 Acute cystitis with hematuria: Secondary | ICD-10-CM | POA: Diagnosis not present

## 2016-11-15 DIAGNOSIS — B9620 Unspecified Escherichia coli [E. coli] as the cause of diseases classified elsewhere: Secondary | ICD-10-CM | POA: Diagnosis present

## 2016-11-15 DIAGNOSIS — D1803 Hemangioma of intra-abdominal structures: Secondary | ICD-10-CM | POA: Diagnosis not present

## 2016-11-15 DIAGNOSIS — W19XXXA Unspecified fall, initial encounter: Secondary | ICD-10-CM | POA: Diagnosis not present

## 2016-11-15 DIAGNOSIS — S0990XA Unspecified injury of head, initial encounter: Secondary | ICD-10-CM | POA: Diagnosis not present

## 2016-11-15 DIAGNOSIS — E876 Hypokalemia: Secondary | ICD-10-CM | POA: Diagnosis not present

## 2016-11-15 DIAGNOSIS — I482 Chronic atrial fibrillation: Secondary | ICD-10-CM | POA: Diagnosis not present

## 2016-11-15 LAB — CBC WITH DIFFERENTIAL/PLATELET
BASOS ABS: 0 10*3/uL (ref 0.0–0.1)
Basophils Relative: 0 %
Eosinophils Absolute: 0.1 10*3/uL (ref 0.0–0.7)
Eosinophils Relative: 1 %
HEMATOCRIT: 37.3 % (ref 36.0–46.0)
HEMOGLOBIN: 12.2 g/dL (ref 12.0–15.0)
Lymphocytes Relative: 15 %
Lymphs Abs: 1.5 10*3/uL (ref 0.7–4.0)
MCH: 29.8 pg (ref 26.0–34.0)
MCHC: 32.7 g/dL (ref 30.0–36.0)
MCV: 91.2 fL (ref 78.0–100.0)
MONO ABS: 1 10*3/uL (ref 0.1–1.0)
Monocytes Relative: 10 %
NEUTROS ABS: 7.3 10*3/uL (ref 1.7–7.7)
Neutrophils Relative %: 74 %
Platelets: 265 10*3/uL (ref 150–400)
RBC: 4.09 MIL/uL (ref 3.87–5.11)
RDW: 12.9 % (ref 11.5–15.5)
WBC: 9.9 10*3/uL (ref 4.0–10.5)

## 2016-11-15 LAB — COMPREHENSIVE METABOLIC PANEL
ALK PHOS: 71 U/L (ref 38–126)
ALT: 18 U/L (ref 14–54)
AST: 23 U/L (ref 15–41)
Albumin: 4.1 g/dL (ref 3.5–5.0)
Anion gap: 10 (ref 5–15)
BILIRUBIN TOTAL: 0.9 mg/dL (ref 0.3–1.2)
BUN: 18 mg/dL (ref 6–20)
CO2: 28 mmol/L (ref 22–32)
CREATININE: 0.89 mg/dL (ref 0.44–1.00)
Calcium: 9.4 mg/dL (ref 8.9–10.3)
Chloride: 97 mmol/L — ABNORMAL LOW (ref 101–111)
GFR calc Af Amer: >60 mL/min (ref >60)
GFR, EST NON AFRICAN AMERICAN: 56 mL/min — AB (ref >60)
Glucose, Bld: 102 mg/dL — ABNORMAL HIGH (ref 65–99)
Potassium: 3.5 mmol/L (ref 3.5–5.1)
Sodium: 135 mmol/L (ref 135–145)
TOTAL PROTEIN: 6.7 g/dL (ref 6.5–8.1)

## 2016-11-15 LAB — MRSA PCR SCREENING: MRSA BY PCR: NEGATIVE

## 2016-11-15 LAB — LIPASE, BLOOD: Lipase: 33 U/L (ref 11–51)

## 2016-11-15 LAB — TROPONIN I
Troponin I: <0.03 ng/mL (ref <0.03)
Troponin I: <0.03 ng/mL (ref <0.03)

## 2016-11-15 LAB — URINALYSIS, ROUTINE W REFLEX MICROSCOPIC
BILIRUBIN URINE: NEGATIVE
GLUCOSE, UA: NEGATIVE mg/dL
KETONES UR: NEGATIVE mg/dL
Nitrite: POSITIVE — AB
PROTEIN: 30 mg/dL — AB
Specific Gravity, Urine: 1.014 (ref 1.005–1.030)
pH: 6 (ref 5.0–8.0)

## 2016-11-15 LAB — PROTIME-INR
INR: 1.05
PROTHROMBIN TIME: 13.7 s (ref 11.4–15.2)

## 2016-11-15 MED ORDER — DONEPEZIL HCL 10 MG PO TABS
10.0000 mg | ORAL_TABLET | Freq: Every day | ORAL | Status: DC
  Administered 2016-11-15 – 2016-11-16 (×2): 10 mg via ORAL
  Filled 2016-11-15 (×2): qty 1

## 2016-11-15 MED ORDER — PROMETHAZINE HCL 25 MG/ML IJ SOLN
6.2500 mg | Freq: Four times a day (QID) | INTRAMUSCULAR | Status: DC | PRN
  Administered 2016-11-15: 6.25 mg via INTRAVENOUS
  Filled 2016-11-15: qty 1

## 2016-11-15 MED ORDER — SODIUM CHLORIDE 0.9 % IV SOLN
INTRAVENOUS | Status: AC
  Administered 2016-11-15 – 2016-11-16 (×2): via INTRAVENOUS

## 2016-11-15 MED ORDER — AMLODIPINE BESYLATE 5 MG PO TABS
2.5000 mg | ORAL_TABLET | Freq: Every day | ORAL | Status: DC
  Administered 2016-11-16 – 2016-11-17 (×2): 2.5 mg via ORAL
  Filled 2016-11-15 (×2): qty 1

## 2016-11-15 MED ORDER — ONDANSETRON HCL 4 MG/2ML IJ SOLN
4.0000 mg | Freq: Once | INTRAMUSCULAR | Status: AC
  Administered 2016-11-15: 4 mg via INTRAVENOUS
  Filled 2016-11-15: qty 2

## 2016-11-15 MED ORDER — IOPAMIDOL (ISOVUE-300) INJECTION 61%
INTRAVENOUS | Status: AC
  Administered 2016-11-15: 100 mL
  Filled 2016-11-15: qty 100

## 2016-11-15 MED ORDER — IPRATROPIUM-ALBUTEROL 0.5-2.5 (3) MG/3ML IN SOLN: 3.0000 mL | Freq: Four times a day (QID) | RESPIRATORY_TRACT | Status: DC | PRN

## 2016-11-15 MED ORDER — POLYETHYLENE GLYCOL 3350 17 G PO PACK: 17.0000 g | PACK | Freq: Every day | ORAL | Status: DC | PRN

## 2016-11-15 MED ORDER — ACETAMINOPHEN 325 MG PO TABS
650.0000 mg | ORAL_TABLET | Freq: Four times a day (QID) | ORAL | Status: DC | PRN
  Filled 2016-11-15: qty 2

## 2016-11-15 MED ORDER — MORPHINE SULFATE (PF) 4 MG/ML IV SOLN
2.0000 mg | Freq: Once | INTRAVENOUS | Status: AC
Start: 2016-11-15 — End: 2016-11-15
  Administered 2016-11-15: 2 mg via INTRAVENOUS
  Filled 2016-11-15: qty 1

## 2016-11-15 MED ORDER — TRIAMTERENE-HCTZ 37.5-25 MG PO TABS
0.5000 | ORAL_TABLET | Freq: Every day | ORAL | Status: DC
  Administered 2016-11-16 – 2016-11-17 (×2): 0.5 via ORAL
  Filled 2016-11-15 (×2): qty 0.5

## 2016-11-15 MED ORDER — DEXTROSE 5 % IV SOLN
1.0000 g | INTRAVENOUS | Status: DC
  Administered 2016-11-16: 1 g via INTRAVENOUS
  Filled 2016-11-15 (×2): qty 10

## 2016-11-15 MED ORDER — IPRATROPIUM-ALBUTEROL 0.5-2.5 (3) MG/3ML IN SOLN
3.0000 mL | Freq: Four times a day (QID) | RESPIRATORY_TRACT | Status: DC
  Filled 2016-11-15 (×2): qty 3

## 2016-11-15 MED ORDER — HYDRALAZINE HCL 20 MG/ML IJ SOLN: 10.0000 mg | Freq: Three times a day (TID) | INTRAMUSCULAR | Status: DC | PRN

## 2016-11-15 MED ORDER — LEVOTHYROXINE SODIUM 75 MCG PO TABS
75.0000 ug | ORAL_TABLET | ORAL | Status: DC
  Administered 2016-11-16: 75 ug via ORAL
  Filled 2016-11-15: qty 1

## 2016-11-15 MED ORDER — ONDANSETRON HCL 4 MG/2ML IJ SOLN
4.0000 mg | Freq: Four times a day (QID) | INTRAMUSCULAR | Status: DC | PRN
  Administered 2016-11-15: 4 mg via INTRAVENOUS
  Filled 2016-11-15: qty 2

## 2016-11-15 MED ORDER — HYDROCODONE-ACETAMINOPHEN 5-325 MG PO TABS
1.0000 | ORAL_TABLET | ORAL | Status: DC | PRN
  Administered 2016-11-15: 1 via ORAL
  Filled 2016-11-15: qty 1

## 2016-11-15 MED ORDER — MORPHINE SULFATE (PF) 4 MG/ML IV SOLN
4.0000 mg | Freq: Once | INTRAVENOUS | Status: AC
  Administered 2016-11-15: 4 mg via INTRAVENOUS
  Filled 2016-11-15: qty 1

## 2016-11-15 MED ORDER — DEXTROSE 5 % IV SOLN
1.0000 g | Freq: Once | INTRAVENOUS | Status: AC
  Administered 2016-11-15: 1 g via INTRAVENOUS
  Filled 2016-11-15: qty 10

## 2016-11-15 MED ORDER — HYDROMORPHONE HCL 2 MG/ML IJ SOLN
1.0000 mg | INTRAMUSCULAR | Status: DC | PRN
  Administered 2016-11-15: 1 mg via INTRAVENOUS
  Filled 2016-11-15: qty 1

## 2016-11-15 MED ORDER — RIVAROXABAN 15 MG PO TABS
15.0000 mg | ORAL_TABLET | Freq: Every day | ORAL | Status: DC
  Administered 2016-11-15: 15 mg via ORAL
  Filled 2016-11-15: qty 1

## 2016-11-15 MED ORDER — ACETAMINOPHEN 650 MG RE SUPP: 650.0000 mg | Freq: Four times a day (QID) | RECTAL | Status: DC | PRN

## 2016-11-15 MED ORDER — CARVEDILOL 12.5 MG PO TABS
6.2500 mg | ORAL_TABLET | Freq: Two times a day (BID) | ORAL | Status: DC
  Administered 2016-11-15 – 2016-11-17 (×4): 6.25 mg via ORAL
  Filled 2016-11-15 (×4): qty 1

## 2016-11-15 MED ORDER — ONDANSETRON HCL 4 MG PO TABS: 4.0000 mg | ORAL_TABLET | Freq: Four times a day (QID) | ORAL | Status: DC | PRN

## 2016-11-15 MED ORDER — LEVOTHYROXINE SODIUM 50 MCG PO TABS
50.0000 ug | ORAL_TABLET | ORAL | Status: DC
  Administered 2016-11-17: 50 ug via ORAL
  Filled 2016-11-15 (×2): qty 1

## 2016-11-15 MED ORDER — SODIUM CHLORIDE 0.9% FLUSH
3.0000 mL | Freq: Two times a day (BID) | INTRAVENOUS | Status: DC
  Administered 2016-11-16 – 2016-11-17 (×2): 3 mL via INTRAVENOUS

## 2016-11-15 NOTE — Progress Notes (Signed)
Pt unavailable when RT checked back. IS left in room. RT will continue to monitor.

## 2016-11-15 NOTE — Progress Notes (Signed)
Patient trasfered from ED to [phone] via stretcher; alert and oriented x 2; complaints of pain in rib cage - she received pain medicine; IV saline locked in RAC - fluids were started @75cc /hr. Orient patient to room and unit; gave patient and her daughters care guide; instructed how to use the call bell and  fall risk precautions. Will continue to monitor the patient.

## 2016-11-15 NOTE — H&P (Signed)
[facility] History and Physical  MARK SCHAFFTER [id number] DOB: Jan 18, 1925 DOA: 11/15/2016  Referring physician:  PCP: Jeanmarie Hubert, MD   Chief Complaint: "She fell in her apartment onto a flower pot."-Daughter  HPI: Jamie Thompson is a 83 y.o. female  83 year old female with dementia, atrial fibrillation, and anticoagulation chronic, high blood pressure, hypothyroid presents with chest pain. Yesterday patient fell at her room landing left lower prior. Patient complained of chest pain at her assisted living facility and a chest x-ray was done. Chest x-ray was completed last night and the results came out this morning showing rib fractures. Patient immediately was sent to the emergency room for evaluation.  ED course: In the emergency room the patient was given morphine for pain which was effective. She also had a CT of the chest done which confirmed rib fractures. The EDP spoke to trauma surgeon on call who advised hospital admission given UTI. Hospitalist consulted for admission.   Review of Systems:  As per HPI otherwise 10 point review of systems negative.    Past Medical History:  Diagnosis Date  . Adenomatous colon polyp 07/17/97  . Allergic rhinitis   . Allergy   . Anxiety disorder   . Atrial fibrillation ([place])   . Chronic anticoagulation   . Diverticulosis   . History of shingles   . Hypercholesteremia   . Hypertension   . Hypothyroidism   . Internal hemorrhoids   . Melanoma ([place])   . Memory loss   . Meniere disease   . Tension headache    Past Surgical History:  Procedure Laterality Date  . CATARACT EXTRACTION Right 2012   Social History:  reports that she has never smoked. She has never used smokeless tobacco. She reports that she does not drink alcohol or use drugs.  Allergies  Allergen Reactions  . Amitriptyline     Urinary Retention  . Amlodipine Swelling  . Erythromycin Hives  . Nitrofurantoin Nausea Only  . Sulfa Antibiotics     "really ill"    . Tavist [Clemastine]     Urinary hesitancy  . Trimethoprim     Cramps  . Verapamil     Constipation    Family History  Problem Relation Age of Onset  . Stroke Father   . Clotting disorder Father   . Colon cancer Mother   . Colon cancer Sister   . Clotting disorder Sister      Prior to Admission medications   Medication Sig Start Date End Date Taking? Authorizing Provider  acetaminophen (TYLENOL) 325 MG tablet Take 650 mg by mouth every 6 (six) hours as needed for headache.   Yes Historical Provider, MD  amLODipine (NORVASC) 2.5 MG tablet Take 2.5 mg by mouth daily.  07/10/16  Yes Historical Provider, MD  Calcium Carbonate-Vitamin D (CALCIUM-D PO) Take 1 tablet by mouth daily.   Yes Historical Provider, MD  carvedilol (COREG) 6.25 MG tablet Take 1 tablet (6.25 mg total) by mouth 2 (two) times daily with a meal. 06/03/16  Yes Eugenie Filler, MD  donepezil (ARICEPT) 10 MG tablet Take 10 mg by mouth at bedtime.  07/09/16  Yes Historical Provider, MD  levothyroxine (SYNTHROID, LEVOTHROID) 50 MCG tablet Take 50 mcg by mouth See admin instructions. Only on Tuesday, Thursday, Saturday, Sunday.   Yes Historical Provider, MD  levothyroxine (SYNTHROID, LEVOTHROID) 75 MCG tablet Take 75 mcg by mouth See admin instructions. Monday; Friday   Yes Historical Provider, MD  Rivaroxaban (XARELTO) 15 MG TABS tablet  Take 1 tablet (15 mg total) by mouth daily with supper. 06/03/16  Yes Eugenie Filler, MD  triamterene-hydrochlorothiazide (MAXZIDE-25) 37.5-25 MG tablet Take 0.5 tablets by mouth daily.  10/29/15  Yes Historical Provider, MD   Physical Exam: Vitals:   11/15/16 1345 11/15/16 1415 11/15/16 1430 11/15/16 1445  BP: 161/78 149/73 164/62 157/77  Pulse: 74 76 71 75  Resp:      Temp:      TempSrc:      SpO2: 99% 98% 98% 98%    Wt Readings from Last 3 Encounters:  11/05/16 63.2 kg (139 lb 4.8 oz)  07/30/16 58.1 kg (128 lb)  06/01/16 56.7 kg (125 lb)    General:  Appears calm and  comfortable. Alert and oriented 2. Eyes:  PERRL, EOMI, normal lids, iris ENT:  grossly normal hearing, lips & tongue, hard of hearing Neck:  no LAD, masses or thyromegaly Cardiovascular:  RRR, no m/r/g. No LE edema.  Respiratory:  CTA bilaterally, no w/r/r. Normal respiratory effort.Chest wall tenderness Abdomen:  soft, ntnd Skin:  no rash or induration seen on limited exam Musculoskeletal:  grossly normal tone BUE/BLE Psychiatric:  grossly normal mood and affect, speech fluent and appropriate Neurologic:  CN 2-12 grossly intact, moves all extremities in coordinated fashion.          Labs on Admission:  Basic Metabolic Panel:  Recent Labs Lab 11/10/16 11/15/16 1335  NA 140 135  K 4.2 3.5  CL  --  97*  CO2  --  28  GLUCOSE  --  102*  BUN 19 18  CREATININE 1.0 0.89  CALCIUM  --  9.4   Liver Function Tests:  Recent Labs Lab 11/10/16 11/15/16 1335  AST 17 23  ALT 13 18  ALKPHOS 79 71  BILITOT  --  0.9  PROT  --  6.7  ALBUMIN  --  4.1    Recent Labs Lab 11/15/16 1335  LIPASE 33   No results for input(s): AMMONIA in the last 168 hours. CBC:  Recent Labs Lab 11/10/16 11/15/16 1335  WBC 7.7 9.9  NEUTROABS  --  7.3  HGB 13.1 12.2  HCT 39 37.3  MCV  --  91.2  PLT 288 265   Cardiac Enzymes: No results for input(s): CKTOTAL, CKMB, CKMBINDEX, TROPONINI in the last 168 hours.  BNP (last 3 results)  Recent Labs  06/01/16 0950  BNP 81.1    ProBNP (last 3 results) No results for input(s): PROBNP in the last 8760 hours.   Serum creatinine: 0.89 mg/dL 11/15/16 1335 Estimated creatinine clearance: 38.6 mL/min  CBG: No results for input(s): GLUCAP in the last 168 hours.  Radiological Exams on Admission: Ct Head Wo Contrast  Result Date: 11/15/2016 CLINICAL DATA:  83 year old female with history of trauma from a fall. Head and neck pain. EXAM: CT HEAD WITHOUT CONTRAST CT CERVICAL SPINE WITHOUT CONTRAST TECHNIQUE: Multidetector CT imaging of the head and  cervical spine was performed following the standard protocol without intravenous contrast. Multiplanar CT image reconstructions of the cervical spine were also generated. COMPARISON:  None. FINDINGS: CT HEAD FINDINGS Brain: Mild cerebral atrophy. Patchy areas of decreased attenuation are noted throughout the deep and periventricular white matter of the cerebral hemispheres bilaterally, compatible with mild chronic microvascular ischemic disease. No evidence of acute infarction, hemorrhage, hydrocephalus, extra-axial collection or mass lesion/mass effect. Vascular: Cerebrovascular atherosclerosis, without acute abnormalities. Skull: Normal. Negative for fracture or focal lesion. Sinuses/Orbits: No acute finding. Other: None. CT CERVICAL SPINE FINDINGS  Alignment: 4 mm of anterolisthesis of C3 upon C4. 4 mm of anterolisthesis of C4 upon C5. These changes are likely chronic. Alignment is otherwise anatomic. Skull base and vertebrae: No acute displaced fracture. No aggressive osseous lesion identified. Soft tissues and spinal canal: No prevertebral fluid or swelling. No visible canal hematoma. Disc levels: Multilevel degenerative disc disease, most pronounced at C5-C6 and C6-C7. Moderate multilevel facet arthropathy. Upper chest: Areas of scarring in the visualize the apices of the lungs bilaterally. Background of mild diffuse ground-glass attenuation and interlobular septal thickening, suggestive of underlying pulmonary edema. Aortic atherosclerosis. Other: None. IMPRESSION: 1. No evidence of significant acute traumatic injury to the skull, brain or cervical spine. 2. Mild cerebral atrophy with chronic microvascular ischemic changes in the cerebral white matter, as above. 3. Multilevel degenerative disc disease and cervical spondylosis, as above. 4. Aortic atherosclerosis. Electronically Signed   By: Vinnie Langton M.D.   On: 11/15/2016 13:06   Ct Chest W Contrast  Result Date: 11/15/2016 CLINICAL DATA:  Patient  status post fall hitting left ribs on [place]. EXAM: CT CHEST, ABDOMEN, AND PELVIS WITH CONTRAST TECHNIQUE: Multidetector CT imaging of the chest, abdomen and pelvis was performed following the standard protocol during bolus administration of intravenous contrast. CONTRAST:  127mL ISOVUE-300 IOPAMIDOL (ISOVUE-300) INJECTION 61% COMPARISON:  Chest radiograph 06/01/2016. FINDINGS: CT CHEST FINDINGS Cardiovascular: Heart is enlarged. No pericardial effusion. Ascending thoracic aorta measures 3.7 cm. Aortic vascular calcifications. Mediastinum/Nodes: No enlarged axillary, mediastinal or hilar lymphadenopathy. Small hiatal hernia. Lungs/Pleura: Central airways are patent. Dependent bandlike atelectasis within the left greater than right lower lobes. Small left pleural effusion. No pneumothorax. Musculoskeletal: Mildly displaced comminuted fracture of the posterior left ninth rib (image 89; series 205). Nondisplaced fracture through the posterior tenth rib. Nondisplaced fractures through the lateral sixth, seventh and eighth left ribs. Thoracic spine degenerative changes. CT ABDOMEN PELVIS FINDINGS Hepatobiliary: There is a 2.0 cm cyst within the right hepatic lobe (image 60; series 201). Additional too small to characterize subcentimeter low-attenuation lesions within the left and right hepatic lobes. Within the hepatic dome there is a 1.6 cm flash filling lesion (image 49; series 201). Additionally, adjacent to the falciform ligament is a 3.7 cm area of increased attenuation/differential profusion (image 52; series 201). Pancreas: Unremarkable Spleen: Unremarkable Adrenals/Urinary Tract: The adrenal glands are normal. Kidneys enhance symmetrically with contrast. Bilateral renal cysts are demonstrated. Additionally there are too small to characterize low-attenuation lesions within the right kidney. There is mild-to-moderate dilatation of the right ureter to the level of the urinary bladder. No definitive obstructing lesion  is identified. Urinary bladder is unremarkable. 2 mm nonobstructing stone superior pole right kidney (image 67; series 203). Stomach/Bowel: Sigmoid colonic diverticulosis. No CT evidence for acute diverticulitis. No abnormal bowel wall thickening or evidence for bowel obstruction. No free fluid or free intraperitoneal air. Normal morphology of the stomach. Vascular/Lymphatic: Infrarenal abdominal aortic ectasia measuring 1.7 cm. No retroperitoneal lymphadenopathy. Reproductive: Uterus and adnexal structures unremarkable. Other: None. Musculoskeletal: Lumbar spine degenerative changes. No aggressive or acute appearing osseous lesions. IMPRESSION: Multiple left-sided rib fractures as above. Small left pleural effusion. No definite left-sided pneumothorax. Otherwise no traumatic visceral injury within the chest, abdomen or pelvis. There is mild-to-moderate dilatation of the right ureter to the level of the urinary bladder without definitive obstructing lesion identified. Consider urologic consultation. Heterogeneous opacities left lung base favored to represent atelectasis. Hyperenhancing lesion within the hepatic dome with associated perfusional anomaly within the liver adjacent to the falciform ligament. In the  absence of known malignancy, these are likely benign in etiology, potentially representing hemangiomas. Consider follow-up pre and post contrast-enhanced MRI in 6 months. Aortic atherosclerosis. Ascending thoracic aorta measures 3.7 cm. Recommend annual imaging followup by CTA or MRA. This recommendation follows 2010 ACCF/AHA/AATS/ACR/ASA/SCA/SCAI/SIR/STS/SVM Guidelines for the Diagnosis and Management of Patients with Thoracic Aortic Disease. Circulation.2010; 121SP:1689793 Electronically Signed   By: Lovey Newcomer M.D.   On: 11/15/2016 13:22   Ct Cervical Spine Wo Contrast  Result Date: 11/15/2016 CLINICAL DATA:  83 year old female with history of trauma from a fall. Head and neck pain. EXAM: CT HEAD  WITHOUT CONTRAST CT CERVICAL SPINE WITHOUT CONTRAST TECHNIQUE: Multidetector CT imaging of the head and cervical spine was performed following the standard protocol without intravenous contrast. Multiplanar CT image reconstructions of the cervical spine were also generated. COMPARISON:  None. FINDINGS: CT HEAD FINDINGS Brain: Mild cerebral atrophy. Patchy areas of decreased attenuation are noted throughout the deep and periventricular white matter of the cerebral hemispheres bilaterally, compatible with mild chronic microvascular ischemic disease. No evidence of acute infarction, hemorrhage, hydrocephalus, extra-axial collection or mass lesion/mass effect. Vascular: Cerebrovascular atherosclerosis, without acute abnormalities. Skull: Normal. Negative for fracture or focal lesion. Sinuses/Orbits: No acute finding. Other: None. CT CERVICAL SPINE FINDINGS Alignment: 4 mm of anterolisthesis of C3 upon C4. 4 mm of anterolisthesis of C4 upon C5. These changes are likely chronic. Alignment is otherwise anatomic. Skull base and vertebrae: No acute displaced fracture. No aggressive osseous lesion identified. Soft tissues and spinal canal: No prevertebral fluid or swelling. No visible canal hematoma. Disc levels: Multilevel degenerative disc disease, most pronounced at C5-C6 and C6-C7. Moderate multilevel facet arthropathy. Upper chest: Areas of scarring in the visualize the apices of the lungs bilaterally. Background of mild diffuse ground-glass attenuation and interlobular septal thickening, suggestive of underlying pulmonary edema. Aortic atherosclerosis. Other: None. IMPRESSION: 1. No evidence of significant acute traumatic injury to the skull, brain or cervical spine. 2. Mild cerebral atrophy with chronic microvascular ischemic changes in the cerebral white matter, as above. 3. Multilevel degenerative disc disease and cervical spondylosis, as above. 4. Aortic atherosclerosis. Electronically Signed   By: Vinnie Langton  M.D.   On: 11/15/2016 13:06   Ct Abdomen Pelvis W Contrast  Result Date: 11/15/2016 CLINICAL DATA:  Patient status post fall hitting left ribs on [place]. EXAM: CT CHEST, ABDOMEN, AND PELVIS WITH CONTRAST TECHNIQUE: Multidetector CT imaging of the chest, abdomen and pelvis was performed following the standard protocol during bolus administration of intravenous contrast. CONTRAST:  125mL ISOVUE-300 IOPAMIDOL (ISOVUE-300) INJECTION 61% COMPARISON:  Chest radiograph 06/01/2016. FINDINGS: CT CHEST FINDINGS Cardiovascular: Heart is enlarged. No pericardial effusion. Ascending thoracic aorta measures 3.7 cm. Aortic vascular calcifications. Mediastinum/Nodes: No enlarged axillary, mediastinal or hilar lymphadenopathy. Small hiatal hernia. Lungs/Pleura: Central airways are patent. Dependent bandlike atelectasis within the left greater than right lower lobes. Small left pleural effusion. No pneumothorax. Musculoskeletal: Mildly displaced comminuted fracture of the posterior left ninth rib (image 89; series 205). Nondisplaced fracture through the posterior tenth rib. Nondisplaced fractures through the lateral sixth, seventh and eighth left ribs. Thoracic spine degenerative changes. CT ABDOMEN PELVIS FINDINGS Hepatobiliary: There is a 2.0 cm cyst within the right hepatic lobe (image 60; series 201). Additional too small to characterize subcentimeter low-attenuation lesions within the left and right hepatic lobes. Within the hepatic dome there is a 1.6 cm flash filling lesion (image 49; series 201). Additionally, adjacent to the falciform ligament is a 3.7 cm area of increased attenuation/differential profusion (image  52; series 201). Pancreas: Unremarkable Spleen: Unremarkable Adrenals/Urinary Tract: The adrenal glands are normal. Kidneys enhance symmetrically with contrast. Bilateral renal cysts are demonstrated. Additionally there are too small to characterize low-attenuation lesions within the right kidney. There is  mild-to-moderate dilatation of the right ureter to the level of the urinary bladder. No definitive obstructing lesion is identified. Urinary bladder is unremarkable. 2 mm nonobstructing stone superior pole right kidney (image 67; series 203). Stomach/Bowel: Sigmoid colonic diverticulosis. No CT evidence for acute diverticulitis. No abnormal bowel wall thickening or evidence for bowel obstruction. No free fluid or free intraperitoneal air. Normal morphology of the stomach. Vascular/Lymphatic: Infrarenal abdominal aortic ectasia measuring 1.7 cm. No retroperitoneal lymphadenopathy. Reproductive: Uterus and adnexal structures unremarkable. Other: None. Musculoskeletal: Lumbar spine degenerative changes. No aggressive or acute appearing osseous lesions. IMPRESSION: Multiple left-sided rib fractures as above. Small left pleural effusion. No definite left-sided pneumothorax. Otherwise no traumatic visceral injury within the chest, abdomen or pelvis. There is mild-to-moderate dilatation of the right ureter to the level of the urinary bladder without definitive obstructing lesion identified. Consider urologic consultation. Heterogeneous opacities left lung base favored to represent atelectasis. Hyperenhancing lesion within the hepatic dome with associated perfusional anomaly within the liver adjacent to the falciform ligament. In the absence of known malignancy, these are likely benign in etiology, potentially representing hemangiomas. Consider follow-up pre and post contrast-enhanced MRI in 6 months. Aortic atherosclerosis. Ascending thoracic aorta measures 3.7 cm. Recommend annual imaging followup by CTA or MRA. This recommendation follows 2010 ACCF/AHA/AATS/ACR/ASA/SCA/SCAI/SIR/STS/SVM Guidelines for the Diagnosis and Management of Patients with Thoracic Aortic Disease. Circulation.2010; [id number] Electronically Signed   By: Lovey Newcomer M.D.   On: 11/15/2016 13:22    EKG: pending  Assessment/Plan Principal  Problem:   Rib fracture Active Problems:   Atrial fibrillation ([facility])   Hypothyroidism   HTN (hypertension)   UTI (urinary tract infection)   Fall  Multiple rib fractures secondary to fall Pulmonary toilet Pain control Trauma consult made by EDP EKG Trop x2 PT consult in AM  UTI Rocephin qd UCx pending  Hypothyroidism Cont OP synthroid 75 mcg qd No signs of hyper or hypothyroidism  Dementia Cont aricept  Hypertension When necessary hydralazine 10 mg IV as needed for severe blood pressure Cont norvasc and coreg, maxzide  Afib Hold xarelto till tomorrow  Code Status: full DVT Prophylaxis: scd Family Communication: dgtr Disposition Plan: Pending Improvement  Status: tele, obs  Elwin Mocha, MD Family Medicine [facility] [email or web address] Password [email or web address]

## 2016-11-15 NOTE — ED Triage Notes (Signed)
Pt reports falling yesterday and hitting left ribs on a [place] pot. Pt had xray done and sent here for left rib fx and pulmonary contusion. Pt denies any pain or sob.

## 2016-11-15 NOTE — ED Notes (Signed)
Lunch given.

## 2016-11-15 NOTE — ED Notes (Signed)
ED Provider at bedside. 

## 2016-11-15 NOTE — ED Notes (Signed)
Pt ambulated to restroom. 

## 2016-11-15 NOTE — Progress Notes (Addendum)
Full consult to follow Chart reviewed rec holding oral anticoag Pain control for rib fx - consider scheduled tylenol pulm toilet, flutter valve, IS PT/OT assessment If felt to be high risk for future falls, consider risk/benefits of long term anticoagulation   Leighton Ruff. Redmond Pulling, MD, FACS General, Bariatric, & Minimally Invasive Surgery [facility], [place]

## 2016-11-15 NOTE — ED Notes (Signed)
Pt trasnsported to XR

## 2016-11-15 NOTE — Consult Note (Signed)
Reason for Consult:fall, rib fx Referring Physician: Dr Mindi Curling is an 83 y.o. female.  HPI: 83 year old female with memory loss, atrial fibrillation, chronic anticoagulation, hypertension, hypothyroidism was brought to the emergency room after reportedly complaining of chest pain at her living facility. She apparently slipped in her apartment and landed on her chest yesterday. Chest x-ray done at that time and the results called in this morning showing rib fractures and patient was brought to the emergency room for evaluation. Both her and her daughter states this is her first fall. She denies any other pain. She specifically denies neck pain, upper extremity, abdominal pain, and lower extremity pain. She has never smoked. She is on Elquis for atrial fibrillation. She denies dysuria or pain with urination. She denies any nausea or vomiting. She denies any vision changes.  12 point review of systems was done and all systems are negative except for what is mentioned in the history of present illness  Past Medical History:  Diagnosis Date  . Adenomatous colon polyp 07/17/97  . Allergic rhinitis   . Allergy   . Anxiety disorder   . Atrial fibrillation ([place])   . Chronic anticoagulation   . Diverticulosis   . History of shingles   . Hypercholesteremia   . Hypertension   . Hypothyroidism   . Internal hemorrhoids   . Melanoma ([place])   . Memory loss   . Meniere disease   . Tension headache     Past Surgical History:  Procedure Laterality Date  . CATARACT EXTRACTION Right 2012    Family History  Problem Relation Age of Onset  . Stroke Father   . Clotting disorder Father   . Colon cancer Mother   . Colon cancer Sister   . Clotting disorder Sister     Social History:  reports that she has never smoked. She has never used smokeless tobacco. She reports that she does not drink alcohol or use drugs.  Allergies:  Allergies  Allergen Reactions  . Amitriptyline     Urinary  Retention  . Amlodipine Swelling  . Erythromycin Hives  . Nitrofurantoin Nausea Only  . Sulfa Antibiotics     "really ill"  . Tavist [Clemastine]     Urinary hesitancy  . Trimethoprim     Cramps  . Verapamil     Constipation    Medications: I have reviewed the patient's current medications.  Results for orders placed or performed during the hospital encounter of 11/15/16 (from the past 48 hour(s))  Urinalysis, Routine w reflex microscopic     Status: Abnormal   Collection Time: 11/15/16 12:02 PM  Result Value Ref Range   Color, Urine YELLOW YELLOW   APPearance HAZY (A) CLEAR   Specific Gravity, Urine 1.014 1.005 - 1.030   pH 6.0 5.0 - 8.0   Glucose, UA NEGATIVE NEGATIVE mg/dL   Hgb urine dipstick SMALL (A) NEGATIVE   Bilirubin Urine NEGATIVE NEGATIVE   Ketones, ur NEGATIVE NEGATIVE mg/dL   Protein, ur 30 (A) NEGATIVE mg/dL   Nitrite POSITIVE (A) NEGATIVE   Leukocytes, UA LARGE (A) NEGATIVE   RBC / HPF 6-30 0 - 5 RBC/hpf   WBC, UA TOO NUMEROUS TO COUNT 0 - 5 WBC/hpf   Bacteria, UA MANY (A) NONE SEEN   Squamous Epithelial / LPF 0-5 (A) NONE SEEN   Mucous PRESENT    Budding Yeast PRESENT   CBC with Differential     Status: None   Collection Time: 11/15/16  1:35 PM  Result Value Ref Range   WBC 9.9 4.0 - 10.5 K/uL   RBC 4.09 3.87 - 5.11 MIL/uL   Hemoglobin 12.2 12.0 - 15.0 g/dL   HCT 37.3 36.0 - 46.0 %   MCV 91.2 78.0 - 100.0 fL   MCH 29.8 26.0 - 34.0 pg   MCHC 32.7 30.0 - 36.0 g/dL   RDW 12.9 11.5 - 15.5 %   Platelets 265 150 - 400 K/uL   Neutrophils Relative % 74 %   Neutro Abs 7.3 1.7 - 7.7 K/uL   Lymphocytes Relative 15 %   Lymphs Abs 1.5 0.7 - 4.0 K/uL   Monocytes Relative 10 %   Monocytes Absolute 1.0 0.1 - 1.0 K/uL   Eosinophils Relative 1 %   Eosinophils Absolute 0.1 0.0 - 0.7 K/uL   Basophils Relative 0 %   Basophils Absolute 0.0 0.0 - 0.1 K/uL  Comprehensive metabolic panel     Status: Abnormal   Collection Time: 11/15/16  1:35 PM  Result Value Ref  Range   Sodium 135 135 - 145 mmol/L   Potassium 3.5 3.5 - 5.1 mmol/L   Chloride 97 (L) 101 - 111 mmol/L   CO2 28 22 - 32 mmol/L   Glucose, Bld 102 (H) 65 - 99 mg/dL   BUN 18 6 - 20 mg/dL   Creatinine, Ser 0.89 0.44 - 1.00 mg/dL   Calcium 9.4 8.9 - 10.3 mg/dL   Total Protein 6.7 6.5 - 8.1 g/dL   Albumin 4.1 3.5 - 5.0 g/dL   AST 23 15 - 41 U/L   ALT 18 14 - 54 U/L   Alkaline Phosphatase 71 38 - 126 U/L   Total Bilirubin 0.9 0.3 - 1.2 mg/dL   GFR calc non Af Amer 56 (L) >60 mL/min   GFR calc Af Amer >60 >60 mL/min    Comment: (NOTE) The eGFR has been calculated using the CKD EPI equation. This calculation has not been validated in all clinical situations. eGFR's persistently <60 mL/min signify possible Chronic Kidney Disease.    Anion gap 10 5 - 15  Protime-INR     Status: None   Collection Time: 11/15/16  1:35 PM  Result Value Ref Range   Prothrombin Time 13.7 11.4 - 15.2 seconds   INR 1.05   Lipase, blood     Status: None   Collection Time: 11/15/16  1:35 PM  Result Value Ref Range   Lipase 33 11 - 51 U/L  Troponin I (q 6hr x 3)     Status: None   Collection Time: 11/15/16  4:16 PM  Result Value Ref Range   Troponin I <0.03 <0.03 ng/mL  MRSA PCR Screening     Status: None   Collection Time: 11/15/16  5:16 PM  Result Value Ref Range   MRSA by PCR NEGATIVE NEGATIVE    Comment:        The GeneXpert MRSA Assay (FDA approved for NASAL specimens only), is one component of a comprehensive MRSA colonization surveillance program. It is not intended to diagnose MRSA infection nor to guide or monitor treatment for MRSA infections.     Ct Head Wo Contrast  Result Date: 11/15/2016 CLINICAL DATA:  83 year old female with history of trauma from a fall. Head and neck pain. EXAM: CT HEAD WITHOUT CONTRAST CT CERVICAL SPINE WITHOUT CONTRAST TECHNIQUE: Multidetector CT imaging of the head and cervical spine was performed following the standard protocol without intravenous contrast.  Multiplanar CT image reconstructions of  the cervical spine were also generated. COMPARISON:  None. FINDINGS: CT HEAD FINDINGS Brain: Mild cerebral atrophy. Patchy areas of decreased attenuation are noted throughout the deep and periventricular white matter of the cerebral hemispheres bilaterally, compatible with mild chronic microvascular ischemic disease. No evidence of acute infarction, hemorrhage, hydrocephalus, extra-axial collection or mass lesion/mass effect. Vascular: Cerebrovascular atherosclerosis, without acute abnormalities. Skull: Normal. Negative for fracture or focal lesion. Sinuses/Orbits: No acute finding. Other: None. CT CERVICAL SPINE FINDINGS Alignment: 4 mm of anterolisthesis of C3 upon C4. 4 mm of anterolisthesis of C4 upon C5. These changes are likely chronic. Alignment is otherwise anatomic. Skull base and vertebrae: No acute displaced fracture. No aggressive osseous lesion identified. Soft tissues and spinal canal: No prevertebral fluid or swelling. No visible canal hematoma. Disc levels: Multilevel degenerative disc disease, most pronounced at C5-C6 and C6-C7. Moderate multilevel facet arthropathy. Upper chest: Areas of scarring in the visualize the apices of the lungs bilaterally. Background of mild diffuse ground-glass attenuation and interlobular septal thickening, suggestive of underlying pulmonary edema. Aortic atherosclerosis. Other: None. IMPRESSION: 1. No evidence of significant acute traumatic injury to the skull, brain or cervical spine. 2. Mild cerebral atrophy with chronic microvascular ischemic changes in the cerebral white matter, as above. 3. Multilevel degenerative disc disease and cervical spondylosis, as above. 4. Aortic atherosclerosis. Electronically Signed   By: Vinnie Langton M.D.   On: 11/15/2016 13:06   Ct Chest W Contrast  Result Date: 11/15/2016 CLINICAL DATA:  Patient status post fall hitting left ribs on [place]. EXAM: CT CHEST, ABDOMEN, AND PELVIS WITH  CONTRAST TECHNIQUE: Multidetector CT imaging of the chest, abdomen and pelvis was performed following the standard protocol during bolus administration of intravenous contrast. CONTRAST:  165m ISOVUE-300 IOPAMIDOL (ISOVUE-300) INJECTION 61% COMPARISON:  Chest radiograph 06/01/2016. FINDINGS: CT CHEST FINDINGS Cardiovascular: Heart is enlarged. No pericardial effusion. Ascending thoracic aorta measures 3.7 cm. Aortic vascular calcifications. Mediastinum/Nodes: No enlarged axillary, mediastinal or hilar lymphadenopathy. Small hiatal hernia. Lungs/Pleura: Central airways are patent. Dependent bandlike atelectasis within the left greater than right lower lobes. Small left pleural effusion. No pneumothorax. Musculoskeletal: Mildly displaced comminuted fracture of the posterior left ninth rib (image 89; series 205). Nondisplaced fracture through the posterior tenth rib. Nondisplaced fractures through the lateral sixth, seventh and eighth left ribs. Thoracic spine degenerative changes. CT ABDOMEN PELVIS FINDINGS Hepatobiliary: There is a 2.0 cm cyst within the right hepatic lobe (image 60; series 201). Additional too small to characterize subcentimeter low-attenuation lesions within the left and right hepatic lobes. Within the hepatic dome there is a 1.6 cm flash filling lesion (image 49; series 201). Additionally, adjacent to the falciform ligament is a 3.7 cm area of increased attenuation/differential profusion (image 52; series 201). Pancreas: Unremarkable Spleen: Unremarkable Adrenals/Urinary Tract: The adrenal glands are normal. Kidneys enhance symmetrically with contrast. Bilateral renal cysts are demonstrated. Additionally there are too small to characterize low-attenuation lesions within the right kidney. There is mild-to-moderate dilatation of the right ureter to the level of the urinary bladder. No definitive obstructing lesion is identified. Urinary bladder is unremarkable. 2 mm nonobstructing stone superior  pole right kidney (image 67; series 203). Stomach/Bowel: Sigmoid colonic diverticulosis. No CT evidence for acute diverticulitis. No abnormal bowel wall thickening or evidence for bowel obstruction. No free fluid or free intraperitoneal air. Normal morphology of the stomach. Vascular/Lymphatic: Infrarenal abdominal aortic ectasia measuring 1.7 cm. No retroperitoneal lymphadenopathy. Reproductive: Uterus and adnexal structures unremarkable. Other: None. Musculoskeletal: Lumbar spine degenerative changes. No aggressive or acute appearing  osseous lesions. IMPRESSION: Multiple left-sided rib fractures as above. Small left pleural effusion. No definite left-sided pneumothorax. Otherwise no traumatic visceral injury within the chest, abdomen or pelvis. There is mild-to-moderate dilatation of the right ureter to the level of the urinary bladder without definitive obstructing lesion identified. Consider urologic consultation. Heterogeneous opacities left lung base favored to represent atelectasis. Hyperenhancing lesion within the hepatic dome with associated perfusional anomaly within the liver adjacent to the falciform ligament. In the absence of known malignancy, these are likely benign in etiology, potentially representing hemangiomas. Consider follow-up pre and post contrast-enhanced MRI in 6 months. Aortic atherosclerosis. Ascending thoracic aorta measures 3.7 cm. Recommend annual imaging followup by CTA or MRA. This recommendation follows 2010 ACCF/AHA/AATS/ACR/ASA/SCA/SCAI/SIR/STS/SVM Guidelines for the Diagnosis and Management of Patients with Thoracic Aortic Disease. Circulation.2010; 121: H829-H371 Electronically Signed   By: Lovey Newcomer M.D.   On: 11/15/2016 13:22   Ct Cervical Spine Wo Contrast  Result Date: 11/15/2016 CLINICAL DATA:  83 year old female with history of trauma from a fall. Head and neck pain. EXAM: CT HEAD WITHOUT CONTRAST CT CERVICAL SPINE WITHOUT CONTRAST TECHNIQUE: Multidetector CT imaging  of the head and cervical spine was performed following the standard protocol without intravenous contrast. Multiplanar CT image reconstructions of the cervical spine were also generated. COMPARISON:  None. FINDINGS: CT HEAD FINDINGS Brain: Mild cerebral atrophy. Patchy areas of decreased attenuation are noted throughout the deep and periventricular white matter of the cerebral hemispheres bilaterally, compatible with mild chronic microvascular ischemic disease. No evidence of acute infarction, hemorrhage, hydrocephalus, extra-axial collection or mass lesion/mass effect. Vascular: Cerebrovascular atherosclerosis, without acute abnormalities. Skull: Normal. Negative for fracture or focal lesion. Sinuses/Orbits: No acute finding. Other: None. CT CERVICAL SPINE FINDINGS Alignment: 4 mm of anterolisthesis of C3 upon C4. 4 mm of anterolisthesis of C4 upon C5. These changes are likely chronic. Alignment is otherwise anatomic. Skull base and vertebrae: No acute displaced fracture. No aggressive osseous lesion identified. Soft tissues and spinal canal: No prevertebral fluid or swelling. No visible canal hematoma. Disc levels: Multilevel degenerative disc disease, most pronounced at C5-C6 and C6-C7. Moderate multilevel facet arthropathy. Upper chest: Areas of scarring in the visualize the apices of the lungs bilaterally. Background of mild diffuse ground-glass attenuation and interlobular septal thickening, suggestive of underlying pulmonary edema. Aortic atherosclerosis. Other: None. IMPRESSION: 1. No evidence of significant acute traumatic injury to the skull, brain or cervical spine. 2. Mild cerebral atrophy with chronic microvascular ischemic changes in the cerebral white matter, as above. 3. Multilevel degenerative disc disease and cervical spondylosis, as above. 4. Aortic atherosclerosis. Electronically Signed   By: Vinnie Langton M.D.   On: 11/15/2016 13:06   Ct Abdomen Pelvis W Contrast  Result Date:  11/15/2016 CLINICAL DATA:  Patient status post fall hitting left ribs on [place]. EXAM: CT CHEST, ABDOMEN, AND PELVIS WITH CONTRAST TECHNIQUE: Multidetector CT imaging of the chest, abdomen and pelvis was performed following the standard protocol during bolus administration of intravenous contrast. CONTRAST:  153m ISOVUE-300 IOPAMIDOL (ISOVUE-300) INJECTION 61% COMPARISON:  Chest radiograph 06/01/2016. FINDINGS: CT CHEST FINDINGS Cardiovascular: Heart is enlarged. No pericardial effusion. Ascending thoracic aorta measures 3.7 cm. Aortic vascular calcifications. Mediastinum/Nodes: No enlarged axillary, mediastinal or hilar lymphadenopathy. Small hiatal hernia. Lungs/Pleura: Central airways are patent. Dependent bandlike atelectasis within the left greater than right lower lobes. Small left pleural effusion. No pneumothorax. Musculoskeletal: Mildly displaced comminuted fracture of the posterior left ninth rib (image 89; series 205). Nondisplaced fracture through the posterior tenth rib. Nondisplaced fractures  through the lateral sixth, seventh and eighth left ribs. Thoracic spine degenerative changes. CT ABDOMEN PELVIS FINDINGS Hepatobiliary: There is a 2.0 cm cyst within the right hepatic lobe (image 60; series 201). Additional too small to characterize subcentimeter low-attenuation lesions within the left and right hepatic lobes. Within the hepatic dome there is a 1.6 cm flash filling lesion (image 49; series 201). Additionally, adjacent to the falciform ligament is a 3.7 cm area of increased attenuation/differential profusion (image 52; series 201). Pancreas: Unremarkable Spleen: Unremarkable Adrenals/Urinary Tract: The adrenal glands are normal. Kidneys enhance symmetrically with contrast. Bilateral renal cysts are demonstrated. Additionally there are too small to characterize low-attenuation lesions within the right kidney. There is mild-to-moderate dilatation of the right ureter to the level of the urinary  bladder. No definitive obstructing lesion is identified. Urinary bladder is unremarkable. 2 mm nonobstructing stone superior pole right kidney (image 67; series 203). Stomach/Bowel: Sigmoid colonic diverticulosis. No CT evidence for acute diverticulitis. No abnormal bowel wall thickening or evidence for bowel obstruction. No free fluid or free intraperitoneal air. Normal morphology of the stomach. Vascular/Lymphatic: Infrarenal abdominal aortic ectasia measuring 1.7 cm. No retroperitoneal lymphadenopathy. Reproductive: Uterus and adnexal structures unremarkable. Other: None. Musculoskeletal: Lumbar spine degenerative changes. No aggressive or acute appearing osseous lesions. IMPRESSION: Multiple left-sided rib fractures as above. Small left pleural effusion. No definite left-sided pneumothorax. Otherwise no traumatic visceral injury within the chest, abdomen or pelvis. There is mild-to-moderate dilatation of the right ureter to the level of the urinary bladder without definitive obstructing lesion identified. Consider urologic consultation. Heterogeneous opacities left lung base favored to represent atelectasis. Hyperenhancing lesion within the hepatic dome with associated perfusional anomaly within the liver adjacent to the falciform ligament. In the absence of known malignancy, these are likely benign in etiology, potentially representing hemangiomas. Consider follow-up pre and post contrast-enhanced MRI in 6 months. Aortic atherosclerosis. Ascending thoracic aorta measures 3.7 cm. Recommend annual imaging followup by CTA or MRA. This recommendation follows 2010 ACCF/AHA/AATS/ACR/ASA/SCA/SCAI/SIR/STS/SVM Guidelines for the Diagnosis and Management of Patients with Thoracic Aortic Disease. Circulation.2010; 121: D408-X448 Electronically Signed   By: Lovey Newcomer M.D.   On: 11/15/2016 13:22    Review of Systems  All other systems reviewed and are negative.  Blood pressure (!) 166/89, pulse 83, temperature 98 F  (36.7 C), temperature source Oral, resp. rate 20, SpO2 100 %. Physical Exam  Vitals reviewed. Constitutional: She is oriented to person, place, and time. She appears well-developed and well-nourished. No distress.  Elderly white female, nad,   HENT:  Head: Normocephalic and atraumatic.  Right Ear: External ear normal.  Left Ear: External ear normal.  Eyes: Conjunctivae are normal. No scleral icterus.  Neck: Normal range of motion. Neck supple. No tracheal deviation present. No thyromegaly present.  Cardiovascular: Normal rate and normal heart sounds.   Respiratory: Effort normal. No stridor. No respiratory distress. She has no wheezes. She has no rales. She exhibits tenderness (left chest wall).  GI: Soft. She exhibits no distension. There is no tenderness. There is no rebound.  Musculoskeletal: She exhibits no edema or tenderness.  Lymphadenopathy:    She has no cervical adenopathy.  Neurological: She is alert and oriented to person, place, and time. She exhibits normal muscle tone.  Oriented to year, city, president name  Skin: Skin is warm and dry. No rash noted. She is not diaphoretic. No erythema. No pallor.  Psychiatric: She has a normal mood and affect. Her behavior is normal. Judgment and thought content normal.  Assessment/Plan: Status post fall Left rib fractures 5 through 10 Small left pleural effusion Urinary tract infection Ascending thoracic aortic aneurysm 3.7 cm Hypertension Hyperlipidemia Hypothyroidism Dementia   probable liver hemangiomas  Discussion with the patient and her daughters regarding rib fractures and management. Discussed the importance of pulmonary toilet and pain control to avoid pulmonary complications.  She is resting very comfortably. Her breathing is nonlabored.  Continue to hold oral anticoagulant for time being PT-OT assessment Pain control abx for uti, f/u cx Pulmonary toilet  Diet as tolerated  Leighton Ruff. Redmond Pulling, MD,  FACS General, Bariatric, & Minimally Invasive Surgery [facility], [place]   [facility] M 11/15/2016, 7:27 PM

## 2016-11-15 NOTE — ED Provider Notes (Signed)
[facility] DEPT Provider Note   CSN: [id number] Arrival date & time: 11/15/16  1017     History   Chief Complaint Chief Complaint  Patient presents with  . Fall    HPI Jamie Thompson is a 83 y.o. female.  HPI   83 year old female with past medical history as below who presents with left sided chest wall pain. Yesterday evening, the patient was getting ready in her house when she tripped over a flower base that was on the ground. She fell directly onto her left side. She experienced acute onset of sharp, stabbing, left chest wall pain. No head injury or loss of consciousness. She was able to ambulate, however, and proceeded throughout the day yesterday with moderate pain. Her family evaluated her and recommended a chest x-ray but this was not performed until this morning. They were called back when the chest x-ray showed rib fractures and she was sent to the emergency department. Currently, patient complains of left chest wall pain but denies any other complaints. No abdominal pain, nausea, or vomiting.  Past Medical History:  Diagnosis Date  . Adenomatous colon polyp 07/17/97  . Allergic rhinitis   . Allergy   . Anxiety disorder   . Atrial fibrillation ([place])   . Chronic anticoagulation   . Diverticulosis   . History of shingles   . Hypercholesteremia   . Hypertension   . Hypothyroidism   . Internal hemorrhoids   . Melanoma ([place])   . Memory loss   . Meniere disease   . Tension headache     Patient Active Problem List   Diagnosis Date Noted  . UTI (urinary tract infection) 11/15/2016  . Fall 11/15/2016  . Rib fracture 11/15/2016  . Hypothyroidism 11/05/2016  . HTN (hypertension) 11/05/2016  . Hypothermia 06/01/2016  . Atrial fibrillation ([place]) 06/01/2016  . E. coli UTI 06/01/2016  . Bradycardia 06/01/2016  . Dementia 06/01/2016  . Diaphoresis 06/01/2016  . Shortness of breath 06/01/2016  . Internal hemorrhoid 07/19/2015    Past Surgical History:  Procedure  Laterality Date  . CATARACT EXTRACTION Right 2012    OB History    No data available       Home Medications    Prior to Admission medications   Medication Sig Start Date End Date Taking? Authorizing Provider  acetaminophen (TYLENOL) 325 MG tablet Take 650 mg by mouth every 6 (six) hours as needed for headache.   Yes Historical Provider, MD  amLODipine (NORVASC) 2.5 MG tablet Take 2.5 mg by mouth daily.  07/10/16  Yes Historical Provider, MD  Calcium Carbonate-Vitamin D (CALCIUM-D PO) Take 1 tablet by mouth daily.   Yes Historical Provider, MD  carvedilol (COREG) 6.25 MG tablet Take 1 tablet (6.25 mg total) by mouth 2 (two) times daily with a meal. 06/03/16  Yes Eugenie Filler, MD  donepezil (ARICEPT) 10 MG tablet Take 10 mg by mouth at bedtime.  07/09/16  Yes Historical Provider, MD  levothyroxine (SYNTHROID, LEVOTHROID) 50 MCG tablet Take 50 mcg by mouth See admin instructions. Only on Tuesday, Thursday, Saturday, Sunday.   Yes Historical Provider, MD  levothyroxine (SYNTHROID, LEVOTHROID) 75 MCG tablet Take 75 mcg by mouth See admin instructions. Monday; Friday   Yes Historical Provider, MD  Rivaroxaban (XARELTO) 15 MG TABS tablet Take 1 tablet (15 mg total) by mouth daily with supper. 06/03/16  Yes Eugenie Filler, MD  triamterene-hydrochlorothiazide (MAXZIDE-25) 37.5-25 MG tablet Take 0.5 tablets by mouth daily.  10/29/15  Yes Historical Provider,  MD    Family History Family History  Problem Relation Age of Onset  . Stroke Father   . Clotting disorder Father   . Colon cancer Mother   . Colon cancer Sister   . Clotting disorder Sister     Social History Social History  Substance Use Topics  . Smoking status: Never Smoker  . Smokeless tobacco: Never Used  . Alcohol use No     Allergies   Amitriptyline; Amlodipine; Erythromycin; Nitrofurantoin; Sulfa antibiotics; Tavist [clemastine]; Trimethoprim; and Verapamil   Review of Systems Review of Systems  Constitutional:  Negative for chills, fatigue and fever.  HENT: Negative for congestion and rhinorrhea.   Eyes: Negative for visual disturbance.  Respiratory: Positive for shortness of breath. Negative for cough and wheezing.   Cardiovascular: Positive for chest pain. Negative for leg swelling.  Gastrointestinal: Negative for abdominal pain, diarrhea, nausea and vomiting.  Genitourinary: Negative for dysuria and flank pain.  Musculoskeletal: Negative for neck pain and neck stiffness.  Skin: Negative for rash and wound.  Allergic/Immunologic: Negative for immunocompromised state.  Neurological: Positive for weakness (Generalized). Negative for syncope and headaches.  All other systems reviewed and are negative.    Physical Exam Updated Vital Signs BP 157/77   Pulse 75   Temp 97.5 F (36.4 C) (Oral)   Resp 19   SpO2 98%   Physical Exam  Constitutional: She is oriented to person, place, and time. She appears well-developed and well-nourished. No distress.  HENT:  Head: Normocephalic and atraumatic.  Eyes: Conjunctivae are normal. Pupils are equal, round, and reactive to light.  Neck: Neck supple.  Cardiovascular: Normal rate, regular rhythm and normal heart sounds.  Exam reveals no friction rub.   No murmur heard. Pulmonary/Chest: Effort normal and breath sounds normal. No respiratory distress. She has no wheezes. She has no rales. She exhibits tenderness (Moderate left-sided chest wall tenderness to palpation over lateral and anterior chest wall. No crepitance or deformity.).  Abdominal: Soft. Bowel sounds are normal. She exhibits no distension.  Musculoskeletal: She exhibits no edema.  Neurological: She is alert and oriented to person, place, and time. She exhibits normal muscle tone.  Skin: Skin is warm. Capillary refill takes less than 2 seconds.  Psychiatric: She has a normal mood and affect.  Nursing note and vitals reviewed.    ED Treatments / Results  Labs (all labs ordered are  listed, but only abnormal results are displayed) Labs Reviewed  COMPREHENSIVE METABOLIC PANEL - Abnormal; Notable for the following:       Result Value   Chloride 97 (*)    Glucose, Bld 102 (*)    GFR calc non Af Amer 56 (*)    All other components within normal limits  URINALYSIS, ROUTINE W REFLEX MICROSCOPIC - Abnormal; Notable for the following:    APPearance HAZY (*)    Hgb urine dipstick SMALL (*)    Protein, ur 30 (*)    Nitrite POSITIVE (*)    Leukocytes, UA LARGE (*)    Bacteria, UA MANY (*)    Squamous Epithelial / LPF 0-5 (*)    All other components within normal limits  URINE CULTURE  CBC WITH DIFFERENTIAL/PLATELET  PROTIME-INR  LIPASE, BLOOD  TROPONIN I  TROPONIN I    EKG  EKG Interpretation None       Radiology Ct Head Wo Contrast  Result Date: 11/15/2016 CLINICAL DATA:  83 year old female with history of trauma from a fall. Head and neck pain. EXAM: CT HEAD  WITHOUT CONTRAST CT CERVICAL SPINE WITHOUT CONTRAST TECHNIQUE: Multidetector CT imaging of the head and cervical spine was performed following the standard protocol without intravenous contrast. Multiplanar CT image reconstructions of the cervical spine were also generated. COMPARISON:  None. FINDINGS: CT HEAD FINDINGS Brain: Mild cerebral atrophy. Patchy areas of decreased attenuation are noted throughout the deep and periventricular white matter of the cerebral hemispheres bilaterally, compatible with mild chronic microvascular ischemic disease. No evidence of acute infarction, hemorrhage, hydrocephalus, extra-axial collection or mass lesion/mass effect. Vascular: Cerebrovascular atherosclerosis, without acute abnormalities. Skull: Normal. Negative for fracture or focal lesion. Sinuses/Orbits: No acute finding. Other: None. CT CERVICAL SPINE FINDINGS Alignment: 4 mm of anterolisthesis of C3 upon C4. 4 mm of anterolisthesis of C4 upon C5. These changes are likely chronic. Alignment is otherwise anatomic. Skull  base and vertebrae: No acute displaced fracture. No aggressive osseous lesion identified. Soft tissues and spinal canal: No prevertebral fluid or swelling. No visible canal hematoma. Disc levels: Multilevel degenerative disc disease, most pronounced at C5-C6 and C6-C7. Moderate multilevel facet arthropathy. Upper chest: Areas of scarring in the visualize the apices of the lungs bilaterally. Background of mild diffuse ground-glass attenuation and interlobular septal thickening, suggestive of underlying pulmonary edema. Aortic atherosclerosis. Other: None. IMPRESSION: 1. No evidence of significant acute traumatic injury to the skull, brain or cervical spine. 2. Mild cerebral atrophy with chronic microvascular ischemic changes in the cerebral white matter, as above. 3. Multilevel degenerative disc disease and cervical spondylosis, as above. 4. Aortic atherosclerosis. Electronically Signed   By: Vinnie Langton M.D.   On: 11/15/2016 13:06   Ct Chest W Contrast  Result Date: 11/15/2016 CLINICAL DATA:  Patient status post fall hitting left ribs on [place]. EXAM: CT CHEST, ABDOMEN, AND PELVIS WITH CONTRAST TECHNIQUE: Multidetector CT imaging of the chest, abdomen and pelvis was performed following the standard protocol during bolus administration of intravenous contrast. CONTRAST:  112mL ISOVUE-300 IOPAMIDOL (ISOVUE-300) INJECTION 61% COMPARISON:  Chest radiograph 06/01/2016. FINDINGS: CT CHEST FINDINGS Cardiovascular: Heart is enlarged. No pericardial effusion. Ascending thoracic aorta measures 3.7 cm. Aortic vascular calcifications. Mediastinum/Nodes: No enlarged axillary, mediastinal or hilar lymphadenopathy. Small hiatal hernia. Lungs/Pleura: Central airways are patent. Dependent bandlike atelectasis within the left greater than right lower lobes. Small left pleural effusion. No pneumothorax. Musculoskeletal: Mildly displaced comminuted fracture of the posterior left ninth rib (image 89; series 205). Nondisplaced  fracture through the posterior tenth rib. Nondisplaced fractures through the lateral sixth, seventh and eighth left ribs. Thoracic spine degenerative changes. CT ABDOMEN PELVIS FINDINGS Hepatobiliary: There is a 2.0 cm cyst within the right hepatic lobe (image 60; series 201). Additional too small to characterize subcentimeter low-attenuation lesions within the left and right hepatic lobes. Within the hepatic dome there is a 1.6 cm flash filling lesion (image 49; series 201). Additionally, adjacent to the falciform ligament is a 3.7 cm area of increased attenuation/differential profusion (image 52; series 201). Pancreas: Unremarkable Spleen: Unremarkable Adrenals/Urinary Tract: The adrenal glands are normal. Kidneys enhance symmetrically with contrast. Bilateral renal cysts are demonstrated. Additionally there are too small to characterize low-attenuation lesions within the right kidney. There is mild-to-moderate dilatation of the right ureter to the level of the urinary bladder. No definitive obstructing lesion is identified. Urinary bladder is unremarkable. 2 mm nonobstructing stone superior pole right kidney (image 67; series 203). Stomach/Bowel: Sigmoid colonic diverticulosis. No CT evidence for acute diverticulitis. No abnormal bowel wall thickening or evidence for bowel obstruction. No free fluid or free intraperitoneal air. Normal morphology of  the stomach. Vascular/Lymphatic: Infrarenal abdominal aortic ectasia measuring 1.7 cm. No retroperitoneal lymphadenopathy. Reproductive: Uterus and adnexal structures unremarkable. Other: None. Musculoskeletal: Lumbar spine degenerative changes. No aggressive or acute appearing osseous lesions. IMPRESSION: Multiple left-sided rib fractures as above. Small left pleural effusion. No definite left-sided pneumothorax. Otherwise no traumatic visceral injury within the chest, abdomen or pelvis. There is mild-to-moderate dilatation of the right ureter to the level of the  urinary bladder without definitive obstructing lesion identified. Consider urologic consultation. Heterogeneous opacities left lung base favored to represent atelectasis. Hyperenhancing lesion within the hepatic dome with associated perfusional anomaly within the liver adjacent to the falciform ligament. In the absence of known malignancy, these are likely benign in etiology, potentially representing hemangiomas. Consider follow-up pre and post contrast-enhanced MRI in 6 months. Aortic atherosclerosis. Ascending thoracic aorta measures 3.7 cm. Recommend annual imaging followup by CTA or MRA. This recommendation follows 2010 ACCF/AHA/AATS/ACR/ASA/SCA/SCAI/SIR/STS/SVM Guidelines for the Diagnosis and Management of Patients with Thoracic Aortic Disease. Circulation.2010; [id number] Electronically Signed   By: Lovey Newcomer M.D.   On: 11/15/2016 13:22   Ct Cervical Spine Wo Contrast  Result Date: 11/15/2016 CLINICAL DATA:  83 year old female with history of trauma from a fall. Head and neck pain. EXAM: CT HEAD WITHOUT CONTRAST CT CERVICAL SPINE WITHOUT CONTRAST TECHNIQUE: Multidetector CT imaging of the head and cervical spine was performed following the standard protocol without intravenous contrast. Multiplanar CT image reconstructions of the cervical spine were also generated. COMPARISON:  None. FINDINGS: CT HEAD FINDINGS Brain: Mild cerebral atrophy. Patchy areas of decreased attenuation are noted throughout the deep and periventricular white matter of the cerebral hemispheres bilaterally, compatible with mild chronic microvascular ischemic disease. No evidence of acute infarction, hemorrhage, hydrocephalus, extra-axial collection or mass lesion/mass effect. Vascular: Cerebrovascular atherosclerosis, without acute abnormalities. Skull: Normal. Negative for fracture or focal lesion. Sinuses/Orbits: No acute finding. Other: None. CT CERVICAL SPINE FINDINGS Alignment: 4 mm of anterolisthesis of C3 upon C4. 4 mm  of anterolisthesis of C4 upon C5. These changes are likely chronic. Alignment is otherwise anatomic. Skull base and vertebrae: No acute displaced fracture. No aggressive osseous lesion identified. Soft tissues and spinal canal: No prevertebral fluid or swelling. No visible canal hematoma. Disc levels: Multilevel degenerative disc disease, most pronounced at C5-C6 and C6-C7. Moderate multilevel facet arthropathy. Upper chest: Areas of scarring in the visualize the apices of the lungs bilaterally. Background of mild diffuse ground-glass attenuation and interlobular septal thickening, suggestive of underlying pulmonary edema. Aortic atherosclerosis. Other: None. IMPRESSION: 1. No evidence of significant acute traumatic injury to the skull, brain or cervical spine. 2. Mild cerebral atrophy with chronic microvascular ischemic changes in the cerebral white matter, as above. 3. Multilevel degenerative disc disease and cervical spondylosis, as above. 4. Aortic atherosclerosis. Electronically Signed   By: Vinnie Langton M.D.   On: 11/15/2016 13:06   Ct Abdomen Pelvis W Contrast  Result Date: 11/15/2016 CLINICAL DATA:  Patient status post fall hitting left ribs on [place]. EXAM: CT CHEST, ABDOMEN, AND PELVIS WITH CONTRAST TECHNIQUE: Multidetector CT imaging of the chest, abdomen and pelvis was performed following the standard protocol during bolus administration of intravenous contrast. CONTRAST:  134mL ISOVUE-300 IOPAMIDOL (ISOVUE-300) INJECTION 61% COMPARISON:  Chest radiograph 06/01/2016. FINDINGS: CT CHEST FINDINGS Cardiovascular: Heart is enlarged. No pericardial effusion. Ascending thoracic aorta measures 3.7 cm. Aortic vascular calcifications. Mediastinum/Nodes: No enlarged axillary, mediastinal or hilar lymphadenopathy. Small hiatal hernia. Lungs/Pleura: Central airways are patent. Dependent bandlike atelectasis within the left greater than right lower  lobes. Small left pleural effusion. No pneumothorax.  Musculoskeletal: Mildly displaced comminuted fracture of the posterior left ninth rib (image 89; series 205). Nondisplaced fracture through the posterior tenth rib. Nondisplaced fractures through the lateral sixth, seventh and eighth left ribs. Thoracic spine degenerative changes. CT ABDOMEN PELVIS FINDINGS Hepatobiliary: There is a 2.0 cm cyst within the right hepatic lobe (image 60; series 201). Additional too small to characterize subcentimeter low-attenuation lesions within the left and right hepatic lobes. Within the hepatic dome there is a 1.6 cm flash filling lesion (image 49; series 201). Additionally, adjacent to the falciform ligament is a 3.7 cm area of increased attenuation/differential profusion (image 52; series 201). Pancreas: Unremarkable Spleen: Unremarkable Adrenals/Urinary Tract: The adrenal glands are normal. Kidneys enhance symmetrically with contrast. Bilateral renal cysts are demonstrated. Additionally there are too small to characterize low-attenuation lesions within the right kidney. There is mild-to-moderate dilatation of the right ureter to the level of the urinary bladder. No definitive obstructing lesion is identified. Urinary bladder is unremarkable. 2 mm nonobstructing stone superior pole right kidney (image 67; series 203). Stomach/Bowel: Sigmoid colonic diverticulosis. No CT evidence for acute diverticulitis. No abnormal bowel wall thickening or evidence for bowel obstruction. No free fluid or free intraperitoneal air. Normal morphology of the stomach. Vascular/Lymphatic: Infrarenal abdominal aortic ectasia measuring 1.7 cm. No retroperitoneal lymphadenopathy. Reproductive: Uterus and adnexal structures unremarkable. Other: None. Musculoskeletal: Lumbar spine degenerative changes. No aggressive or acute appearing osseous lesions. IMPRESSION: Multiple left-sided rib fractures as above. Small left pleural effusion. No definite left-sided pneumothorax. Otherwise no traumatic visceral  injury within the chest, abdomen or pelvis. There is mild-to-moderate dilatation of the right ureter to the level of the urinary bladder without definitive obstructing lesion identified. Consider urologic consultation. Heterogeneous opacities left lung base favored to represent atelectasis. Hyperenhancing lesion within the hepatic dome with associated perfusional anomaly within the liver adjacent to the falciform ligament. In the absence of known malignancy, these are likely benign in etiology, potentially representing hemangiomas. Consider follow-up pre and post contrast-enhanced MRI in 6 months. Aortic atherosclerosis. Ascending thoracic aorta measures 3.7 cm. Recommend annual imaging followup by CTA or MRA. This recommendation follows 2010 ACCF/AHA/AATS/ACR/ASA/SCA/SCAI/SIR/STS/SVM Guidelines for the Diagnosis and Management of Patients with Thoracic Aortic Disease. Circulation.2010; [id number] Electronically Signed   By: Lovey Newcomer M.D.   On: 11/15/2016 13:22    Procedures Procedures (including critical care time)  Medications Ordered in ED Medications  levothyroxine (SYNTHROID, LEVOTHROID) tablet 75 mcg (not administered)  amLODipine (NORVASC) tablet 2.5 mg (not administered)  donepezil (ARICEPT) tablet 10 mg (not administered)  carvedilol (COREG) tablet 6.25 mg (not administered)  Rivaroxaban (XARELTO) tablet 15 mg (not administered)  triamterene-hydrochlorothiazide (MAXZIDE-25) 37.5-25 MG per tablet 0.5 tablet (not administered)  levothyroxine (SYNTHROID, LEVOTHROID) tablet 50 mcg (not administered)  hydrALAZINE (APRESOLINE) injection 10 mg (not administered)  sodium chloride flush (NS) 0.9 % injection 3 mL (not administered)  0.9 %  sodium chloride infusion (not administered)  acetaminophen (TYLENOL) tablet 650 mg (not administered)    Or  acetaminophen (TYLENOL) suppository 650 mg (not administered)  HYDROcodone-acetaminophen (NORCO/VICODIN) 5-325 MG per tablet 1-2 tablet (not  administered)  polyethylene glycol (MIRALAX / GLYCOLAX) packet 17 g (not administered)  ondansetron (ZOFRAN) tablet 4 mg (not administered)    Or  ondansetron (ZOFRAN) injection 4 mg (not administered)  HYDROmorphone (DILAUDID) injection 1 mg (not administered)  ipratropium-albuterol (DUONEB) 0.5-2.5 (3) MG/3ML nebulizer solution 3 mL (not administered)  iopamidol (ISOVUE-300) 61 % injection (100 mLs  Contrast Given  11/15/16 1240)  morphine 4 MG/ML injection 2 mg (2 mg Intravenous Given 11/15/16 1316)  cefTRIAXone (ROCEPHIN) 1 g in dextrose 5 % 50 mL IVPB (0 g Intravenous Stopped 11/15/16 1433)  morphine 4 MG/ML injection 4 mg (4 mg Intravenous Given 11/15/16 1534)  ondansetron (ZOFRAN) injection 4 mg (4 mg Intravenous Given 11/15/16 1537)     Initial Impression / Assessment and Plan / ED Course  I have reviewed the triage vital signs and the nursing notes.  Pertinent labs & imaging results that were available during my care of the patient were reviewed by me and considered in my medical decision making (see chart for details).  Clinical Course as of Nov 16 1627  Sun Nov 15, 2016  1503 Sodium: 135 [CI]    Clinical Course User Index [CI] Duffy Bruce, MD    83 year old female with past medical history as above who presents with severe left chest wall pain after fall yesterday. Plain films show moderately displaced left-sided rib fractures. Given her age, mechanism of injury, and diffuse tenderness, will obtain further imaging. Of note, she is on Xarelto so will obtain CT head as well.  CT head and C-spine are negative. Cervical spine cleared clinically. CTs show multiple left fifth through 10th rib fractures. No pneumothorax but there is some atelectasis. Will admit for pulmonary toilet and pain control. No other intra-abdominal injuries. Incidentally noted UTI on urinalysis. Will treat with Rocephin.  D/w Dr. Stann Mainland of trauma, who recommends hospitalist admission. Will admit to  Hospitalist under Dr. Aggie Moats.  Final Clinical Impressions(s) / ED Diagnoses   Final diagnoses:  Fall, initial encounter  Closed fracture of multiple ribs of left side, initial encounter  Acute cystitis with hematuria    New Prescriptions New Prescriptions   No medications on file     Duffy Bruce, MD 11/15/16 [phone]

## 2016-11-16 DIAGNOSIS — I482 Chronic atrial fibrillation: Secondary | ICD-10-CM

## 2016-11-16 DIAGNOSIS — F0390 Unspecified dementia without behavioral disturbance: Secondary | ICD-10-CM | POA: Diagnosis present

## 2016-11-16 DIAGNOSIS — B9620 Unspecified Escherichia coli [E. coli] as the cause of diseases classified elsewhere: Secondary | ICD-10-CM | POA: Diagnosis present

## 2016-11-16 DIAGNOSIS — N3000 Acute cystitis without hematuria: Secondary | ICD-10-CM | POA: Diagnosis not present

## 2016-11-16 DIAGNOSIS — Y92009 Unspecified place in unspecified non-institutional (private) residence as the place of occurrence of the external cause: Secondary | ICD-10-CM | POA: Diagnosis not present

## 2016-11-16 DIAGNOSIS — E039 Hypothyroidism, unspecified: Secondary | ICD-10-CM | POA: Diagnosis not present

## 2016-11-16 DIAGNOSIS — W19XXXA Unspecified fall, initial encounter: Secondary | ICD-10-CM | POA: Diagnosis not present

## 2016-11-16 DIAGNOSIS — D1803 Hemangioma of intra-abdominal structures: Secondary | ICD-10-CM | POA: Diagnosis present

## 2016-11-16 DIAGNOSIS — I712 Thoracic aortic aneurysm, without rupture: Secondary | ICD-10-CM | POA: Diagnosis present

## 2016-11-16 DIAGNOSIS — E876 Hypokalemia: Secondary | ICD-10-CM | POA: Diagnosis not present

## 2016-11-16 DIAGNOSIS — S2242XA Multiple fractures of ribs, left side, initial encounter for closed fracture: Secondary | ICD-10-CM | POA: Diagnosis not present

## 2016-11-16 DIAGNOSIS — J90 Pleural effusion, not elsewhere classified: Secondary | ICD-10-CM | POA: Diagnosis present

## 2016-11-16 DIAGNOSIS — I10 Essential (primary) hypertension: Secondary | ICD-10-CM | POA: Diagnosis present

## 2016-11-16 DIAGNOSIS — D6832 Hemorrhagic disorder due to extrinsic circulating anticoagulants: Secondary | ICD-10-CM | POA: Diagnosis not present

## 2016-11-16 DIAGNOSIS — W01198A Fall on same level from slipping, tripping and stumbling with subsequent striking against other object, initial encounter: Secondary | ICD-10-CM | POA: Diagnosis present

## 2016-11-16 DIAGNOSIS — Z79899 Other long term (current) drug therapy: Secondary | ICD-10-CM | POA: Diagnosis not present

## 2016-11-16 DIAGNOSIS — Z85820 Personal history of malignant melanoma of skin: Secondary | ICD-10-CM | POA: Diagnosis not present

## 2016-11-16 DIAGNOSIS — I4891 Unspecified atrial fibrillation: Secondary | ICD-10-CM | POA: Diagnosis present

## 2016-11-16 DIAGNOSIS — R079 Chest pain, unspecified: Secondary | ICD-10-CM | POA: Diagnosis not present

## 2016-11-16 DIAGNOSIS — E785 Hyperlipidemia, unspecified: Secondary | ICD-10-CM | POA: Diagnosis present

## 2016-11-16 DIAGNOSIS — N390 Urinary tract infection, site not specified: Secondary | ICD-10-CM | POA: Diagnosis present

## 2016-11-16 LAB — BASIC METABOLIC PANEL
Anion gap: 9 (ref 5–15)
BUN: 18 mg/dL (ref 6–20)
CALCIUM: 9.2 mg/dL (ref 8.9–10.3)
CHLORIDE: 103 mmol/L (ref 101–111)
CO2: 26 mmol/L (ref 22–32)
CREATININE: 0.84 mg/dL (ref 0.44–1.00)
GFR calc non Af Amer: 60 mL/min — ABNORMAL LOW (ref >60)
GLUCOSE: 93 mg/dL (ref 65–99)
Potassium: 3.5 mmol/L (ref 3.5–5.1)
Sodium: 138 mmol/L (ref 135–145)

## 2016-11-16 LAB — CBC
HCT: 37.3 % (ref 36.0–46.0)
HEMOGLOBIN: 12 g/dL (ref 12.0–15.0)
MCH: 29.7 pg (ref 26.0–34.0)
MCHC: 32.2 g/dL (ref 30.0–36.0)
MCV: 92.3 fL (ref 78.0–100.0)
PLATELETS: 260 10*3/uL (ref 150–400)
RBC: 4.04 MIL/uL (ref 3.87–5.11)
RDW: 13.1 % (ref 11.5–15.5)
WBC: 11 10*3/uL — ABNORMAL HIGH (ref 4.0–10.5)

## 2016-11-16 MED ORDER — TRAMADOL HCL 50 MG PO TABS
50.0000 mg | ORAL_TABLET | Freq: Four times a day (QID) | ORAL | Status: DC | PRN
  Filled 2016-11-16: qty 1

## 2016-11-16 MED ORDER — PHENAZOPYRIDINE HCL 100 MG PO TABS
100.0000 mg | ORAL_TABLET | Freq: Three times a day (TID) | ORAL | Status: DC
  Administered 2016-11-16 – 2016-11-17 (×3): 100 mg via ORAL
  Filled 2016-11-16 (×3): qty 1

## 2016-11-16 MED ORDER — PHENAZOPYRIDINE HCL 100 MG PO TABS: 100.0000 mg | ORAL_TABLET | Freq: Three times a day (TID) | ORAL | Status: DC

## 2016-11-16 NOTE — Progress Notes (Signed)
PROGRESS NOTE    Jamie Thompson  [id number] DOB: April 07, 1925 DOA: 11/15/2016 PCP: Jeanmarie Hubert, MD   Outpatient Specialists:     Brief Narrative:  83 year old female with past medical history as below who presents with left sided chest wall pain. Yesterday evening, the patient was getting ready in her house when she tripped over a flower base that was on the ground. She fell directly onto her left side. She experienced acute onset of sharp, stabbing, left chest wall pain. No head injury or loss of consciousness. She was able to ambulate, however, and proceeded throughout the day yesterday with moderate pain. Her family evaluated her and recommended a chest x-ray but this was not performed until this morning. They were called back when the chest x-ray showed rib fractures and she was sent to the emergency department. Currently, patient complains of left chest wall pain but denies any other complaints. No abdominal pain, nausea, or vomiting.   Assessment & Plan:   Principal Problem:   Rib fracture Active Problems:   Atrial fibrillation (HCC)   Hypothyroidism   HTN (hypertension)   UTI (urinary tract infection)   Fall   Multiple rib fractures secondary to fall Pulmonary toilet Pain control Trauma consult made by EDP PT consult   UTI Rocephin qd UCx pending  Hypothyroidism Cont OP synthroid 75 mcg qd  Dementia Cont aricept  Hypertension When necessary hydralazine 10 mg IV as needed for severe blood pressure Cont norvasc and coreg, maxzide  Afib xarelto -CBC daily  Ascending thoracic aorta measures 3.7 cm. Recommend annual imaging followup by CTA or MRA.   DVT prophylaxis:  Fully anticoagulated   Code Status: Full Code   Family Communication:   Disposition Plan:     Consultants:   Trauma team     Subjective: C/o of having to go to bathroom frequently- then not urinating  Objective: Vitals:   11/15/16 1600 11/15/16 1719 11/15/16 2232  11/16/16 0539  BP: 160/90 (!) 166/89 (!) 140/50 (!) 149/75  Pulse: 80 83 74 76  Resp:  20 16 18   Temp:  98 F (36.7 C) 98 F (36.7 C) 98.1 F (36.7 C)  TempSrc:  Oral Oral Oral  SpO2: 96% 100% 98% 99%    Intake/Output Summary (Last 24 hours) at 11/16/16 1022 Last data filed at 11/16/16 [id number]  Gross per 24 hour  Intake          1353.75 ml  Output              200 ml  Net          1153.75 ml   There were no vitals filed for this visit.  Examination:  General exam: irritated, wants to get on bedside commode Respiratory system: Clear to auscultation. Respiratory effort normal. Cardiovascular system: S1 & S2 heard, RRR. No JVD, murmurs, rubs, gallops or clicks. No pedal edema. Gastrointestinal system: Abdomen is nondistended, soft and nontender. No organomegaly or masses felt. Normal bowel sounds heard. Central nervous system: Alert and oriented    Data Reviewed: I have personally reviewed following labs and imaging studies  CBC:  Recent Labs Lab 11/10/16 11/15/16 1335 11/16/16 0512  WBC 7.7 9.9 11.0*  NEUTROABS  --  7.3  --   HGB 13.1 12.2 12.0  HCT 39 37.3 37.3  MCV  --  91.2 92.3  PLT 288 265 [id number]   Basic Metabolic Panel:  Recent Labs Lab 11/10/16 11/15/16 1335 11/16/16 0512  NA 140 135 138  K  4.2 3.5 3.5  CL  --  97* 103  CO2  --  28 26  GLUCOSE  --  102* 93  BUN 19 18 18   CREATININE 1.0 0.89 0.84  CALCIUM  --  9.4 9.2   GFR: Estimated Creatinine Clearance: 40.9 mL/min (by C-G formula based on SCr of 0.84 mg/dL). Liver Function Tests:  Recent Labs Lab 11/10/16 11/15/16 1335  AST 17 23  ALT 13 18  ALKPHOS 79 71  BILITOT  --  0.9  PROT  --  6.7  ALBUMIN  --  4.1    Recent Labs Lab 11/15/16 1335  LIPASE 33   No results for input(s): AMMONIA in the last 168 hours. Coagulation Profile:  Recent Labs Lab 11/15/16 1335  INR 1.05   Cardiac Enzymes:  Recent Labs Lab 11/15/16 1616 11/15/16 2132  TROPONINI <0.03 <0.03   BNP (last 3  results) No results for input(s): PROBNP in the last 8760 hours. HbA1C: No results for input(s): HGBA1C in the last 72 hours. CBG: No results for input(s): GLUCAP in the last 168 hours. Lipid Profile: No results for input(s): CHOL, HDL, LDLCALC, TRIG, CHOLHDL, LDLDIRECT in the last 72 hours. Thyroid Function Tests: No results for input(s): TSH, T4TOTAL, FREET4, T3FREE, THYROIDAB in the last 72 hours. Anemia Panel: No results for input(s): VITAMINB12, FOLATE, FERRITIN, TIBC, IRON, RETICCTPCT in the last 72 hours. Urine analysis:    Component Value Date/Time   COLORURINE YELLOW 11/15/2016 1202   APPEARANCEUR HAZY (A) 11/15/2016 1202   LABSPEC 1.014 11/15/2016 1202   PHURINE 6.0 11/15/2016 1202   GLUCOSEU NEGATIVE 11/15/2016 1202   HGBUR SMALL (A) 11/15/2016 1202   BILIRUBINUR NEGATIVE 11/15/2016 1202   Levy 11/15/2016 1202   PROTEINUR 30 (A) 11/15/2016 1202   NITRITE POSITIVE (A) 11/15/2016 1202   LEUKOCYTESUR LARGE (A) 11/15/2016 1202      Recent Results (from the past 240 hour(s))  MRSA PCR Screening     Status: None   Collection Time: 11/15/16  5:16 PM  Result Value Ref Range Status   MRSA by PCR NEGATIVE NEGATIVE Final    Comment:        The GeneXpert MRSA Assay (FDA approved for NASAL specimens only), is one component of a comprehensive MRSA colonization surveillance program. It is not intended to diagnose MRSA infection nor to guide or monitor treatment for MRSA infections.       Anti-infectives    Start     Dose/Rate Route Frequency Ordered Stop   11/16/16 1400  cefTRIAXone (ROCEPHIN) 1 g in dextrose 5 % 50 mL IVPB     1 g 100 mL/hr over 30 Minutes Intravenous Every 24 hours 11/15/16 2007     11/15/16 1345  cefTRIAXone (ROCEPHIN) 1 g in dextrose 5 % 50 mL IVPB     1 g 100 mL/hr over 30 Minutes Intravenous  Once 11/15/16 1336 11/15/16 1433       Radiology Studies: Ct Head Wo Contrast  Result Date: 11/15/2016 CLINICAL DATA:  83 year old  female with history of trauma from a fall. Head and neck pain. EXAM: CT HEAD WITHOUT CONTRAST CT CERVICAL SPINE WITHOUT CONTRAST TECHNIQUE: Multidetector CT imaging of the head and cervical spine was performed following the standard protocol without intravenous contrast. Multiplanar CT image reconstructions of the cervical spine were also generated. COMPARISON:  None. FINDINGS: CT HEAD FINDINGS Brain: Mild cerebral atrophy. Patchy areas of decreased attenuation are noted throughout the deep and periventricular white matter of the cerebral hemispheres  bilaterally, compatible with mild chronic microvascular ischemic disease. No evidence of acute infarction, hemorrhage, hydrocephalus, extra-axial collection or mass lesion/mass effect. Vascular: Cerebrovascular atherosclerosis, without acute abnormalities. Skull: Normal. Negative for fracture or focal lesion. Sinuses/Orbits: No acute finding. Other: None. CT CERVICAL SPINE FINDINGS Alignment: 4 mm of anterolisthesis of C3 upon C4. 4 mm of anterolisthesis of C4 upon C5. These changes are likely chronic. Alignment is otherwise anatomic. Skull base and vertebrae: No acute displaced fracture. No aggressive osseous lesion identified. Soft tissues and spinal canal: No prevertebral fluid or swelling. No visible canal hematoma. Disc levels: Multilevel degenerative disc disease, most pronounced at C5-C6 and C6-C7. Moderate multilevel facet arthropathy. Upper chest: Areas of scarring in the visualize the apices of the lungs bilaterally. Background of mild diffuse ground-glass attenuation and interlobular septal thickening, suggestive of underlying pulmonary edema. Aortic atherosclerosis. Other: None. IMPRESSION: 1. No evidence of significant acute traumatic injury to the skull, brain or cervical spine. 2. Mild cerebral atrophy with chronic microvascular ischemic changes in the cerebral white matter, as above. 3. Multilevel degenerative disc disease and cervical spondylosis, as  above. 4. Aortic atherosclerosis. Electronically Signed   By: Vinnie Langton M.D.   On: 11/15/2016 13:06   Ct Chest W Contrast  Result Date: 11/15/2016 CLINICAL DATA:  Patient status post fall hitting left ribs on [place]. EXAM: CT CHEST, ABDOMEN, AND PELVIS WITH CONTRAST TECHNIQUE: Multidetector CT imaging of the chest, abdomen and pelvis was performed following the standard protocol during bolus administration of intravenous contrast. CONTRAST:  165mL ISOVUE-300 IOPAMIDOL (ISOVUE-300) INJECTION 61% COMPARISON:  Chest radiograph 06/01/2016. FINDINGS: CT CHEST FINDINGS Cardiovascular: Heart is enlarged. No pericardial effusion. Ascending thoracic aorta measures 3.7 cm. Aortic vascular calcifications. Mediastinum/Nodes: No enlarged axillary, mediastinal or hilar lymphadenopathy. Small hiatal hernia. Lungs/Pleura: Central airways are patent. Dependent bandlike atelectasis within the left greater than right lower lobes. Small left pleural effusion. No pneumothorax. Musculoskeletal: Mildly displaced comminuted fracture of the posterior left ninth rib (image 89; series 205). Nondisplaced fracture through the posterior tenth rib. Nondisplaced fractures through the lateral sixth, seventh and eighth left ribs. Thoracic spine degenerative changes. CT ABDOMEN PELVIS FINDINGS Hepatobiliary: There is a 2.0 cm cyst within the right hepatic lobe (image 60; series 201). Additional too small to characterize subcentimeter low-attenuation lesions within the left and right hepatic lobes. Within the hepatic dome there is a 1.6 cm flash filling lesion (image 49; series 201). Additionally, adjacent to the falciform ligament is a 3.7 cm area of increased attenuation/differential profusion (image 52; series 201). Pancreas: Unremarkable Spleen: Unremarkable Adrenals/Urinary Tract: The adrenal glands are normal. Kidneys enhance symmetrically with contrast. Bilateral renal cysts are demonstrated. Additionally there are too small to  characterize low-attenuation lesions within the right kidney. There is mild-to-moderate dilatation of the right ureter to the level of the urinary bladder. No definitive obstructing lesion is identified. Urinary bladder is unremarkable. 2 mm nonobstructing stone superior pole right kidney (image 67; series 203). Stomach/Bowel: Sigmoid colonic diverticulosis. No CT evidence for acute diverticulitis. No abnormal bowel wall thickening or evidence for bowel obstruction. No free fluid or free intraperitoneal air. Normal morphology of the stomach. Vascular/Lymphatic: Infrarenal abdominal aortic ectasia measuring 1.7 cm. No retroperitoneal lymphadenopathy. Reproductive: Uterus and adnexal structures unremarkable. Other: None. Musculoskeletal: Lumbar spine degenerative changes. No aggressive or acute appearing osseous lesions. IMPRESSION: Multiple left-sided rib fractures as above. Small left pleural effusion. No definite left-sided pneumothorax. Otherwise no traumatic visceral injury within the chest, abdomen or pelvis. There is mild-to-moderate dilatation of the right  ureter to the level of the urinary bladder without definitive obstructing lesion identified. Consider urologic consultation. Heterogeneous opacities left lung base favored to represent atelectasis. Hyperenhancing lesion within the hepatic dome with associated perfusional anomaly within the liver adjacent to the falciform ligament. In the absence of known malignancy, these are likely benign in etiology, potentially representing hemangiomas. Consider follow-up pre and post contrast-enhanced MRI in 6 months. Aortic atherosclerosis. Ascending thoracic aorta measures 3.7 cm. Recommend annual imaging followup by CTA or MRA. This recommendation follows 2010 ACCF/AHA/AATS/ACR/ASA/SCA/SCAI/SIR/STS/SVM Guidelines for the Diagnosis and Management of Patients with Thoracic Aortic Disease. Circulation.2010; [id number] Electronically Signed   By: Lovey Newcomer M.D.    On: 11/15/2016 13:22   Ct Cervical Spine Wo Contrast  Result Date: 11/15/2016 CLINICAL DATA:  83 year old female with history of trauma from a fall. Head and neck pain. EXAM: CT HEAD WITHOUT CONTRAST CT CERVICAL SPINE WITHOUT CONTRAST TECHNIQUE: Multidetector CT imaging of the head and cervical spine was performed following the standard protocol without intravenous contrast. Multiplanar CT image reconstructions of the cervical spine were also generated. COMPARISON:  None. FINDINGS: CT HEAD FINDINGS Brain: Mild cerebral atrophy. Patchy areas of decreased attenuation are noted throughout the deep and periventricular white matter of the cerebral hemispheres bilaterally, compatible with mild chronic microvascular ischemic disease. No evidence of acute infarction, hemorrhage, hydrocephalus, extra-axial collection or mass lesion/mass effect. Vascular: Cerebrovascular atherosclerosis, without acute abnormalities. Skull: Normal. Negative for fracture or focal lesion. Sinuses/Orbits: No acute finding. Other: None. CT CERVICAL SPINE FINDINGS Alignment: 4 mm of anterolisthesis of C3 upon C4. 4 mm of anterolisthesis of C4 upon C5. These changes are likely chronic. Alignment is otherwise anatomic. Skull base and vertebrae: No acute displaced fracture. No aggressive osseous lesion identified. Soft tissues and spinal canal: No prevertebral fluid or swelling. No visible canal hematoma. Disc levels: Multilevel degenerative disc disease, most pronounced at C5-C6 and C6-C7. Moderate multilevel facet arthropathy. Upper chest: Areas of scarring in the visualize the apices of the lungs bilaterally. Background of mild diffuse ground-glass attenuation and interlobular septal thickening, suggestive of underlying pulmonary edema. Aortic atherosclerosis. Other: None. IMPRESSION: 1. No evidence of significant acute traumatic injury to the skull, brain or cervical spine. 2. Mild cerebral atrophy with chronic microvascular ischemic changes  in the cerebral white matter, as above. 3. Multilevel degenerative disc disease and cervical spondylosis, as above. 4. Aortic atherosclerosis. Electronically Signed   By: Vinnie Langton M.D.   On: 11/15/2016 13:06   Ct Abdomen Pelvis W Contrast  Result Date: 11/15/2016 CLINICAL DATA:  Patient status post fall hitting left ribs on [place]. EXAM: CT CHEST, ABDOMEN, AND PELVIS WITH CONTRAST TECHNIQUE: Multidetector CT imaging of the chest, abdomen and pelvis was performed following the standard protocol during bolus administration of intravenous contrast. CONTRAST:  162mL ISOVUE-300 IOPAMIDOL (ISOVUE-300) INJECTION 61% COMPARISON:  Chest radiograph 06/01/2016. FINDINGS: CT CHEST FINDINGS Cardiovascular: Heart is enlarged. No pericardial effusion. Ascending thoracic aorta measures 3.7 cm. Aortic vascular calcifications. Mediastinum/Nodes: No enlarged axillary, mediastinal or hilar lymphadenopathy. Small hiatal hernia. Lungs/Pleura: Central airways are patent. Dependent bandlike atelectasis within the left greater than right lower lobes. Small left pleural effusion. No pneumothorax. Musculoskeletal: Mildly displaced comminuted fracture of the posterior left ninth rib (image 89; series 205). Nondisplaced fracture through the posterior tenth rib. Nondisplaced fractures through the lateral sixth, seventh and eighth left ribs. Thoracic spine degenerative changes. CT ABDOMEN PELVIS FINDINGS Hepatobiliary: There is a 2.0 cm cyst within the right hepatic lobe (image 60; series 201). Additional too  small to characterize subcentimeter low-attenuation lesions within the left and right hepatic lobes. Within the hepatic dome there is a 1.6 cm flash filling lesion (image 49; series 201). Additionally, adjacent to the falciform ligament is a 3.7 cm area of increased attenuation/differential profusion (image 52; series 201). Pancreas: Unremarkable Spleen: Unremarkable Adrenals/Urinary Tract: The adrenal glands are normal. Kidneys  enhance symmetrically with contrast. Bilateral renal cysts are demonstrated. Additionally there are too small to characterize low-attenuation lesions within the right kidney. There is mild-to-moderate dilatation of the right ureter to the level of the urinary bladder. No definitive obstructing lesion is identified. Urinary bladder is unremarkable. 2 mm nonobstructing stone superior pole right kidney (image 67; series 203). Stomach/Bowel: Sigmoid colonic diverticulosis. No CT evidence for acute diverticulitis. No abnormal bowel wall thickening or evidence for bowel obstruction. No free fluid or free intraperitoneal air. Normal morphology of the stomach. Vascular/Lymphatic: Infrarenal abdominal aortic ectasia measuring 1.7 cm. No retroperitoneal lymphadenopathy. Reproductive: Uterus and adnexal structures unremarkable. Other: None. Musculoskeletal: Lumbar spine degenerative changes. No aggressive or acute appearing osseous lesions. IMPRESSION: Multiple left-sided rib fractures as above. Small left pleural effusion. No definite left-sided pneumothorax. Otherwise no traumatic visceral injury within the chest, abdomen or pelvis. There is mild-to-moderate dilatation of the right ureter to the level of the urinary bladder without definitive obstructing lesion identified. Consider urologic consultation. Heterogeneous opacities left lung base favored to represent atelectasis. Hyperenhancing lesion within the hepatic dome with associated perfusional anomaly within the liver adjacent to the falciform ligament. In the absence of known malignancy, these are likely benign in etiology, potentially representing hemangiomas. Consider follow-up pre and post contrast-enhanced MRI in 6 months. Aortic atherosclerosis. Ascending thoracic aorta measures 3.7 cm. Recommend annual imaging followup by CTA or MRA. This recommendation follows 2010 ACCF/AHA/AATS/ACR/ASA/SCA/SCAI/SIR/STS/SVM Guidelines for the Diagnosis and Management of Patients  with Thoracic Aortic Disease. Circulation.2010; [id number] Electronically Signed   By: Lovey Newcomer M.D.   On: 11/15/2016 13:22        Scheduled Meds: . amLODipine  2.5 mg Oral Daily  . carvedilol  6.25 mg Oral BID WC  . cefTRIAXone (ROCEPHIN)  IV  1 g Intravenous Q24H  . donepezil  10 mg Oral QHS  . [START ON 11/17/2016] levothyroxine  50 mcg Oral Once per day on Sun Tue Thu Sat  . levothyroxine  75 mcg Oral Once per day on Mon Fri  . phenazopyridine  100 mg Oral TID WC  . Rivaroxaban  15 mg Oral Q supper  . sodium chloride flush  3 mL Intravenous Q12H  . triamterene-hydrochlorothiazide  0.5 tablet Oral Daily   Continuous Infusions: . sodium chloride 75 mL/hr at 11/16/16 0445     LOS: 0 days    Time spent: 25 min    [place], DO [facility] Pager [phone]  If 7PM-7AM, please contact night-coverage [email or web address] Password [facility] 11/16/2016, 10:22 AM

## 2016-11-16 NOTE — Evaluation (Signed)
Physical Therapy Evaluation Patient Details Name: Jamie Thompson MRN: [id number] DOB: June 15, 1925 Today's Date: 11/16/2016   History of Present Illness  83 y.o. female with h/o dementia, HTN, Afib admitted with UTI, fall, L 6-10 rib fxs.   Clinical Impression  Pt admitted with above diagnosis. Pt currently with functional limitations due to the deficits listed below (see PT Problem List). Pt ambulated 150' with min hand held assist for balance. At baseline she walks without assist. At present she would benefit from close supervision for mobility, likely for just 2-3 more days until she returns to baseline. Pt's daughter stated she can stay with pt at her ALF or have facility provide supervision for mobility.  Pt will benefit from skilled PT to increase their independence and safety with mobility to allow discharge to the venue listed below.       Follow Up Recommendations Home health PT;Supervision for mobility/OOB    Equipment Recommendations  Cane    Recommendations for Other Services       Precautions / Restrictions Precautions Precautions: Fall Precaution Comments: 1 fall just prior to admission, no other falls in past year per family; multiple L rib fxs, instructed pt/daughter to brace L chest wall with pillow for comfort, esp with coughing/sneezing/laughing Restrictions Weight Bearing Restrictions: No      Mobility  Bed Mobility               General bed mobility comments: NT- up in chair  Transfers Overall transfer level: Needs assistance Equipment used: None Transfers: Sit to/from Stand Sit to Stand: Supervision         General transfer comment: VCs hand placement and to scoot to edge of recliner prior to standing  Ambulation/Gait Ambulation/Gait assistance: Min assist;Supervision Ambulation Distance (Feet): 200 Feet Assistive device: 1 person hand held assist;None Gait Pattern/deviations: Decreased step length - right;Decreased step length - left   Gait  velocity interpretation: Below normal speed for age/gender General Gait Details: pt ambulated 150' with HHA of 1 person, and 77' without HHA, pt had significant decrease in velocity and was much more cautious when ambulating without HHA, will plan to try cane next session  Stairs            Wheelchair Mobility    Modified Rankin (Stroke Patients Only)       Balance Overall balance assessment: Needs assistance;History of Falls   Sitting balance-Leahy Scale: Good       Standing balance-Leahy Scale: Good                               Pertinent Vitals/Pain Pain Assessment: No/denies pain (no pain at rest nor with ambulation, pt repots pain earlier today with palpation to L lateral chest)    Home Living Family/patient expects to be discharged to:: Assisted living               Home Equipment: Shower seat      Prior Function Level of Independence: Needs assistance   Gait / Transfers Assistance Needed: independently walks to dining room without AD  ADL's / Homemaking Assistance Needed: assist for bathing, dresses independently        Hand Dominance        Extremity/Trunk Assessment   Upper Extremity Assessment Upper Extremity Assessment: Overall WFL for tasks assessed (Pt is L handed, demonstrates good AROM and strength LUE, pain only with palpation to L lateral chest at rib fx site)  Lower Extremity Assessment Lower Extremity Assessment: Overall WFL for tasks assessed    Cervical / Trunk Assessment Cervical / Trunk Assessment: Normal  Communication   Communication: HOH  Cognition Arousal/Alertness: Awake/alert Behavior During Therapy: WFL for tasks assessed/performed Overall Cognitive Status: History of cognitive impairments - at baseline (daughter reports pt is at baseline with confusion 2* dementia)                      General Comments      Exercises     Assessment/Plan    PT Assessment Patient needs continued PT  services  PT Problem List Decreased activity tolerance;Decreased balance;Pain;Decreased mobility       PT Treatment Interventions Gait training;Functional mobility training;Balance training;Therapeutic exercise;Therapeutic activities;Patient/family education    PT Goals (Current goals can be found in the Care Plan section)  Acute Rehab PT Goals Patient Stated Goal: return to ALF at [facility] PT Goal Formulation: With patient/family Time For Goal Achievement: 11/23/16 Potential to Achieve Goals: Good    Frequency Min 3X/week   Barriers to discharge        Co-evaluation               End of Session Equipment Utilized During Treatment: Gait belt Activity Tolerance: Patient tolerated treatment well;No increased pain Patient left: in chair;with call bell/phone within reach;with family/visitor present;with chair alarm set Nurse Communication: Mobility status PT Visit Diagnosis: History of falling (Z91.81);Difficulty in walking, not elsewhere classified (R26.2)    Functional Assessment Tool Used: AM-PAC 6 Clicks Basic Mobility;Clinical judgement Functional Limitation: Mobility: Walking and moving around Mobility: Walking and Moving Around Current Status [id number]): At least 20 percent but less than 40 percent impaired, limited or restricted Mobility: Walking and Moving Around Goal Status [phone]): At least 1 percent but less than 20 percent impaired, limited or restricted    Time: 0929-1015 PT Time Calculation (min) (ACUTE ONLY): 46 min   Charges:   PT Evaluation $PT Eval Low Complexity: 1 Procedure PT Treatments $Gait Training: 8-22 mins $Therapeutic Activity: 8-22 mins   PT G Codes:   PT G-Codes **NOT FOR INPATIENT CLASS** Functional Assessment Tool Used: AM-PAC 6 Clicks Basic Mobility;Clinical judgement Functional Limitation: Mobility: Walking and moving around Mobility: Walking and Moving Around Current Status [id number]): At least 20 percent but less than 40 percent  impaired, limited or restricted Mobility: Walking and Moving Around Goal Status [phone]): At least 1 percent but less than 20 percent impaired, limited or restricted     Philomena Doheny 11/16/2016, 10:30 AM [phone]

## 2016-11-16 NOTE — Clinical Social Work Note (Signed)
Clinical Social Work Assessment  Patient Details  Name: Jamie Thompson MRN: [id number] Date of Birth: 08/22/1925  Date of referral:  11/16/16               Reason for consult:  Discharge Planning                Permission sought to share information with:  Facility Sport and exercise psychologist, Family Supports Permission granted to share information::  Yes, Verbal Permission Granted  Name::     Jamie Thompson  Agency::  [facility]  Relationship::  Daughter  Contact Information:  [phone]  Housing/Transportation Living arrangements for the past 2 months:  [place] of Information:  Patient, Adult Children Patient Interpreter Needed:  None Criminal Activity/Legal Involvement Pertinent to Current Situation/Hospitalization:  No - Comment as needed Significant Relationships:  Adult Children Lives with:  Facility Resident Do you feel safe going back to the place where you live?  Yes Need for family participation in patient care:  Yes (Comment)  Care giving concerns:  CSW received consult regarding discharge needs. CSW spoke with patient and her daughter at bedside. Patient resides at [facility] and plans to return there with extra assisstance. CSW to continue to follow and assist with discharge planning needs.   Social Worker assessment / plan:  Patient to return to ALF at discharge.   Employment status:  Retired Forensic scientist:  Medicare PT Recommendations:  Home with [place] / Referral to community resources:     Patient/Family's Response to care:  Patient's daughter expressed agreement with discharge plan and will contact [facility] to set up additional support.   Patient/Family's Understanding of and Emotional Response to Diagnosis, Current Treatment, and Prognosis:  Patient expressed understanding of CSW role and discharge process. No questions/concerns about plan or treatment.    Emotional Assessment Appearance:  Appears  stated age Attitude/Demeanor/Rapport:  Other (Pleasant; Appropriate) Affect (typically observed):  Accepting, Appropriate, Pleasant Orientation:  Oriented to Self, Oriented to  Time, Oriented to Situation Alcohol / Substance use:  Not Applicable Psych involvement (Current and /or in the community):  No (Comment)  Discharge Needs  Concerns to be addressed:  Care Coordination Readmission within the last 30 days:  No Current discharge risk:  None Barriers to Discharge:  Continued Medical Work up   [employer], [place] 11/16/2016, 10:55 AM

## 2016-11-16 NOTE — Progress Notes (Signed)
Patient ID: Jamie Thompson, female   DOB: [birth year], 83 y.o.   MRN: [id number] Trauma Consult F/U     Subjective: Up in chair, mild L rib pain  Objective: Vital signs in last 24 hours: Temp:  [98 F (36.7 C)-98.1 F (36.7 C)] 98.1 F (36.7 C) (02/19 0539) Pulse Rate:  [71-84] 76 (02/19 0539) Resp:  [16-20] 18 (02/19 0539) BP: (140-181)/(50-90) 149/75 (02/19 0539) SpO2:  [96 %-100 %] 99 % (02/19 0539) Last BM Date: 11/14/16  Intake/Output from previous day: 02/18 0701 - 02/19 0700 In: 1113.8 [P.O.:60; I.V.:1003.8; IV Piggyback:50] Out: 200 [Urine:200] Intake/Output this shift: Total I/O In: 240 [P.O.:240] Out: -   Gen - up in chair, no distress Lungs - CTA Chest wall - L side tenderness, no crepitance Abd - soft, NT, ND IS: [phone]   Lab Results: CBC   Recent Labs  11/15/16 1335 11/16/16 0512  WBC 9.9 11.0*  HGB 12.2 12.0  HCT 37.3 37.3  PLT 265 260   BMET  Recent Labs  11/15/16 1335 11/16/16 0512  NA 135 138  K 3.5 3.5  CL 97* 103  CO2 28 26  GLUCOSE 102* 93  BUN 18 18  CREATININE 0.89 0.84  CALCIUM 9.4 9.2   PT/INR  Recent Labs  11/15/16 1335  LABPROT 13.7  INR 1.05   Assessment/Plan: Fall L rib FX 6-10 - pulm toilet, BDs PRN, added Ultram for pain In light of her fall, we recommend D/C Xarelto - I stopped it and I spoke with her daughter regarding discussing this with her primary care doctor.   LOS: 0 days    Georganna Skeans, MD, MPH, FACS Trauma: [phone] General Surgery: [phone]  11/16/2016

## 2016-11-16 NOTE — Care Management Obs Status (Signed)
[place] NOTIFICATION   Patient Details  Name: Jamie Thompson MRN: [id number] Date of Birth: 03-20-1925   Medicare Observation Status Notification Given:  Yes    Sharin Mons, RN 11/16/2016, 5:52 PM

## 2016-11-16 NOTE — Progress Notes (Signed)
Pt currently sleeping. RT left flutter valve in room. Will teach IS and flutter once pt is awake. RT will continue to monitor.

## 2016-11-17 DIAGNOSIS — W19XXXA Unspecified fall, initial encounter: Secondary | ICD-10-CM

## 2016-11-17 LAB — CBC
HCT: 35.2 % — ABNORMAL LOW (ref 36.0–46.0)
HEMOGLOBIN: 11.4 g/dL — AB (ref 12.0–15.0)
MCH: 29.5 pg (ref 26.0–34.0)
MCHC: 32.4 g/dL (ref 30.0–36.0)
MCV: 91.2 fL (ref 78.0–100.0)
Platelets: 252 10*3/uL (ref 150–400)
RBC: 3.86 MIL/uL — ABNORMAL LOW (ref 3.87–5.11)
RDW: 13.1 % (ref 11.5–15.5)
WBC: 10.7 10*3/uL — ABNORMAL HIGH (ref 4.0–10.5)

## 2016-11-17 LAB — URINE CULTURE

## 2016-11-17 LAB — BASIC METABOLIC PANEL
ANION GAP: 9 (ref 5–15)
BUN: 18 mg/dL (ref 6–20)
CO2: 27 mmol/L (ref 22–32)
Calcium: 9.3 mg/dL (ref 8.9–10.3)
Chloride: 100 mmol/L — ABNORMAL LOW (ref 101–111)
Creatinine, Ser: 0.8 mg/dL (ref 0.44–1.00)
Glucose, Bld: 108 mg/dL — ABNORMAL HIGH (ref 65–99)
POTASSIUM: 3.1 mmol/L — AB (ref 3.5–5.1)
Sodium: 136 mmol/L (ref 135–145)

## 2016-11-17 MED ORDER — POTASSIUM CHLORIDE CRYS ER 20 MEQ PO TBCR
40.0000 meq | EXTENDED_RELEASE_TABLET | Freq: Once | ORAL | Status: AC
  Administered 2016-11-17: 40 meq via ORAL
  Filled 2016-11-17: qty 2

## 2016-11-17 MED ORDER — CEFUROXIME AXETIL 250 MG PO TABS
250.0000 mg | ORAL_TABLET | Freq: Two times a day (BID) | ORAL | Status: DC
  Administered 2016-11-17: 250 mg via ORAL
  Filled 2016-11-17 (×2): qty 1

## 2016-11-17 MED ORDER — PHENAZOPYRIDINE HCL 100 MG PO TABS: 100.0000 mg | ORAL_TABLET | Freq: Three times a day (TID) | ORAL | 0 refills | Status: DC

## 2016-11-17 MED ORDER — CEFUROXIME AXETIL 250 MG PO TABS: 250.0000 mg | ORAL_TABLET | Freq: Two times a day (BID) | ORAL | 0 refills | Status: DC

## 2016-11-17 NOTE — Evaluation (Signed)
Occupational Therapy Evaluation Patient Details Name: Jamie Thompson MRN: [id number] DOB: 12-Mar-1925 Today's Date: 11/17/2016    History of Present Illness 83 y.o. female with h/o dementia, HTN, Afib admitted with UTI, fall, L 6-10 rib fxs.    Clinical Impression   Pt required assist with bathing but was able to dress self and perform toilet transfers independently PTA. Currently pt min assist for ADL and min guard for ADL. Pt planning to return to ALF upon d/c; discussed need for 24/7 supervision with daughter. Pt would benefit from continued skilled OT to address established goals.    Follow Up Recommendations  No OT follow up;Supervision/Assistance - 24 hour    Equipment Recommendations  None recommended by OT    Recommendations for Other Services       Precautions / Restrictions Precautions Precautions: Fall Precaution Comments: daughter reports pt with no other falls recently Restrictions Weight Bearing Restrictions: No      Mobility Bed Mobility Overal bed mobility: Needs Assistance Bed Mobility: Supine to Sit     Supine to sit: Supervision;HOB elevated     General bed mobility comments: increased time, HOB elevated with use of bed rail   Transfers Overall transfer level: Needs assistance Equipment used: 1 person hand held assist Transfers: Sit to/from Stand Sit to Stand: Min assist         General transfer comment: Min hand held assist for sit to stand from EOB    Balance Overall balance assessment: Needs assistance;History of Falls Sitting-balance support: Feet supported Sitting balance-Leahy Scale: Good     Standing balance support: No upper extremity supported;During functional activity Standing balance-Leahy Scale: Fair                              ADL Overall ADL's : Needs assistance/impaired Eating/Feeding: Set up;Sitting   Grooming: Min guard;Standing   Upper Body Bathing: Minimal assistance;Sitting   Lower Body Bathing:  Minimal assistance;Sit to/from stand   Upper Body Dressing : Minimal assistance;Sitting   Lower Body Dressing: Minimal assistance;Sit to/from stand   Toilet Transfer: Min guard;Ambulation;BSC Toilet Transfer Details (indicate cue type and reason): Simulated by sit to stand from EOB with functional mobility in room Toileting- Clothing Manipulation and Hygiene: Minimal assistance;Sit to/from stand       Functional mobility during ADLs: Min guard General ADL Comments: Discussed need for 24/7 supervision initially upon d/c with daguhter; she is agreeable and requesting private pay sitter list for additional support (MD notified CM)     Vision         Perception     Praxis      Pertinent Vitals/Pain Pain Assessment: Faces Faces Pain Scale: Hurts even more Pain Location: L ribs Pain Descriptors / Indicators: Guarding;Sore Pain Intervention(s): Monitored during session;Repositioned     Hand Dominance Left   Extremity/Trunk Assessment Upper Extremity Assessment Upper Extremity Assessment: Overall WFL for tasks assessed   Lower Extremity Assessment Lower Extremity Assessment: Defer to PT evaluation   Cervical / Trunk Assessment Cervical / Trunk Assessment: Normal   Communication Communication Communication: HOH   Cognition Arousal/Alertness: Awake/alert Behavior During Therapy: WFL for tasks assessed/performed Overall Cognitive Status: History of cognitive impairments - at baseline                     General Comments       Exercises       Shoulder Instructions      Home  Living Family/patient expects to be discharged to:: Assisted living                             Home Equipment: Shower seat   Additional Comments: daughter can stay with pt; CM getting sitter list for additional supervision      Prior Functioning/Environment Level of Independence: Needs assistance  Gait / Transfers Assistance Needed: independently walks to dining room  without AD ADL's / Homemaking Assistance Needed: assist for bathing, dresses independently Communication / Swallowing Assistance Needed: HOH          OT Problem List: Impaired balance (sitting and/or standing);Decreased cognition;Decreased safety awareness;Decreased knowledge of use of DME or AE;Decreased knowledge of precautions;Pain      OT Treatment/Interventions: Self-care/ADL training;Energy conservation;DME and/or AE instruction;Therapeutic activities;Patient/family education;Balance training    OT Goals(Current goals can be found in the care plan section) Acute Rehab OT Goals Patient Stated Goal: return to ALF at [facility] OT Goal Formulation: With patient/family Time For Goal Achievement: 12/01/16 Potential to Achieve Goals: Good ADL Goals Pt Will Perform Grooming: with supervision;standing Pt Will Perform Upper Body Dressing: with supervision;sitting Pt Will Perform Lower Body Dressing: with supervision;sit to/from stand Pt Will Transfer to Toilet: with supervision;ambulating;regular height toilet;grab bars Pt Will Perform Toileting - Clothing Manipulation and hygiene: with supervision;sit to/from stand  OT Frequency: Min 2X/week   Barriers to D/C:            Co-evaluation              End of Session Nurse Communication: Mobility status  Activity Tolerance: Patient tolerated treatment well Patient left: in chair;with call bell/phone within reach;with chair alarm set;with family/visitor present  OT Visit Diagnosis: History of falling (Z91.81)                ADL either performed or assessed with clinical judgement  Time: [id number] OT Time Calculation (min): 19 min Charges:  OT General Charges $OT Visit: 1 Procedure OT Evaluation $OT Eval Moderate Complexity: 1 Procedure G-Codes:     Binnie Kand M.S., OTR/L Pager: [phone]  11/17/2016, 9:54 AM

## 2016-11-17 NOTE — Progress Notes (Signed)
Subjective: No acute events overnight. Pain controlled. Being discharged home today.   Objective: Vitals:   11/16/16 2157 11/17/16 0617  BP: (!) 149/64 (!) 177/86  Pulse: 76 88  Resp: 18 18  Temp: 97.9 F (36.6 C) 98.3 F (36.8 C)  Gen - alert and in no distress Lungs - CTAB, normal effort Chest wall - appropriate L side tenderness, no crepitance Abd - soft, non-tender IS: 1000cc   A&P: Fall L rib fractures 6-10 - stable for discharge from trauma standpoint - recommend d/c xarelto and F/U w/ PCP - continue pain control w/ ULTRAM/Tylenol and IS at home

## 2016-11-17 NOTE — Care Management Note (Signed)
Case Management Note  Patient Details  Name: Jamie Thompson MRN: [id number] Date of Birth: 12/01/1924  Subjective/Objective:        Pt presentedwith left sided chest wall pain.  the patient had  tripped over a flower base that was on the ground in her home.Suffered per  chest x-ray  rib fractures.         Ramon Dredge (Daughter) Narda Bonds (Son) Lowella Fairy (daughter)      [phone]        Action/Plan: Plan is to d/c back to ALF with home health PT. CM spoke with daughter Alyson Ingles regarding discharge plan and offered private duty list which was requested by family. Alyson Ingles states family will be with mom during the day and they are looking to hire someone to sit with mom during the night. CSW managing disposition to ALF. Daughter(Reade) will transport pt to ALF.  Expected Discharge Date:  11/17/16               Expected Discharge Plan:  Assisted Living / Rest Home  In-House Referral:  Clinical Social Work  Discharge planning Services  CM Consult  Post Acute Care Choice:    Choice offered to:  Patient  DME Arranged:    DME Agency:     HH Arranged:   PT HH Agency:   [facility] will arrange home health services.   Status of Service:  Completed, signed off  If discussed at [place] of Stay Meetings, dates discussed:    Additional Comments:  Sharin Mons, RN 11/17/2016, 10:44 AM

## 2016-11-17 NOTE — Progress Notes (Signed)
Patient will DC to: [facility]F Anticipated DC date: 11/17/16 Family notified: Daughter Transport by: [name] by car   Per MD patient ready for DC to ALF. RN, patient, patient's family, and facility notified of DC. Discharge Summary and FL2 sent to facility. RN given number for report [phone])  CSW signing off.  Cedric Fishman, [place] Social Worker [phone]

## 2016-11-17 NOTE — [place] FL2 (Signed)
[place] MEDICAID FL2 LEVEL OF CARE SCREENING TOOL     IDENTIFICATION  Patient Name: Jamie Thompson Birthdate: Sep 25, 1925 Sex: female Admission Date (Current Location): 11/15/2016  [facility] and [place] Number:  Herbalist and Address:  [facility], [place] [address] [place], [place] [address]      Provider Number: [id number]  Attending Physician Name and Address:  Geradine Girt, DO  Relative Name and Phone Number:  [name], daughter, [phone]    Current Level of Care: Hospital Recommended Level of Care: [place] Prior Approval Number:    Date Approved/Denied:   PASRR Number:    Discharge Plan: Other (Comment) (ALF)    Current Diagnoses: Patient Active Problem List   Diagnosis Date Noted  . UTI (urinary tract infection) 11/15/2016  . Fall 11/15/2016  . Rib fracture 11/15/2016  . Hypothyroidism 11/05/2016  . HTN (hypertension) 11/05/2016  . Hypothermia 06/01/2016  . Atrial fibrillation ([place]) 06/01/2016  . E. coli UTI 06/01/2016  . Bradycardia 06/01/2016  . Dementia 06/01/2016  . Diaphoresis 06/01/2016  . Shortness of breath 06/01/2016  . Internal hemorrhoid 07/19/2015    Orientation RESPIRATION BLADDER Height & Weight     Self, Time, Situation  Normal Continent Weight:   Height:     BEHAVIORAL SYMPTOMS/MOOD NEUROLOGICAL BOWEL NUTRITION STATUS      Continent Diet (Regular)  AMBULATORY STATUS COMMUNICATION OF NEEDS Skin   Supervision Verbally Normal                       Personal Care Assistance Level of Assistance  Bathing, Feeding, Dressing Bathing Assistance: Maximum assistance Feeding assistance: Independent Dressing Assistance: Limited assistance     Functional Limitations Info             SPECIAL CARE FACTORS FREQUENCY  PT (By licensed PT)     PT Frequency: Home health PT 3x/week              Contractures      Additional Factors Info  Code Status, Allergies Code Status  Info: Full Allergies Info: Amitriptyline, Amlodipine, Erythromycin, Nitrofurantoin, Sulfa Antibiotics, Tavist Clemastine, Trimethoprim, Verapamil           Current Medications (11/17/2016):    Discharge Medications: STOP taking these medications   Rivaroxaban 15 MG Tabs tablet Commonly known as:  XARELTO     TAKE these medications   acetaminophen 325 MG tablet Commonly known as:  TYLENOL Take 650 mg by mouth every 6 (six) hours as needed for headache.   amLODipine 2.5 MG tablet Commonly known as:  NORVASC Take 2.5 mg by mouth daily.   CALCIUM-D PO Take 1 tablet by mouth daily.   carvedilol 6.25 MG tablet Commonly known as:  COREG Take 1 tablet (6.25 mg total) by mouth 2 (two) times daily with a meal.   cefUROXime 250 MG tablet Commonly known as:  CEFTIN Take 1 tablet (250 mg total) by mouth 2 (two) times daily with a meal.   donepezil 10 MG tablet Commonly known as:  ARICEPT Take 10 mg by mouth at bedtime.   levothyroxine 50 MCG tablet Commonly known as:  SYNTHROID, LEVOTHROID Take 50 mcg by mouth See admin instructions. Only on Tuesday, Thursday, Saturday, Sunday.   levothyroxine 75 MCG tablet Commonly known as:  SYNTHROID, LEVOTHROID Take 75 mcg by mouth See admin instructions. Monday; Friday   phenazopyridine 100 MG tablet Commonly known as:  PYRIDIUM Take 1 tablet (100 mg  total) by mouth 3 (three) times daily with meals.   triamterene-hydrochlorothiazide 37.5-25 MG tablet Commonly known as:  MAXZIDE-25 Take 0.5 tablets by mouth daily.     Relevant Imaging Results:  Relevant Lab Results:   Additional Information SSN: [place], [place]

## 2016-11-17 NOTE — Progress Notes (Signed)
Nsg Discharge Note  Admit Date:  11/15/2016 Discharge date: 11/17/2016   Bernadene Bell to be D/C'd Skilled nursing facility per MD order.  AVS completed.  Copy for chart, and copy for patient signed, and dated. Patient/caregiver able to verbalize understanding.  Discharge Medication: Allergies as of 11/17/2016      Reactions   Amitriptyline    Urinary Retention   Amlodipine Swelling   Erythromycin Hives   Nitrofurantoin Nausea Only   Sulfa Antibiotics    "really ill"   Tavist [clemastine]    Urinary hesitancy   Trimethoprim    Cramps   Verapamil    Constipation      Medication List    STOP taking these medications   Rivaroxaban 15 MG Tabs tablet Commonly known as:  XARELTO     TAKE these medications   acetaminophen 325 MG tablet Commonly known as:  TYLENOL Take 650 mg by mouth every 6 (six) hours as needed for headache.   amLODipine 2.5 MG tablet Commonly known as:  NORVASC Take 2.5 mg by mouth daily.   CALCIUM-D PO Take 1 tablet by mouth daily.   carvedilol 6.25 MG tablet Commonly known as:  COREG Take 1 tablet (6.25 mg total) by mouth 2 (two) times daily with a meal.   cefUROXime 250 MG tablet Commonly known as:  CEFTIN Take 1 tablet (250 mg total) by mouth 2 (two) times daily with a meal.   donepezil 10 MG tablet Commonly known as:  ARICEPT Take 10 mg by mouth at bedtime.   levothyroxine 50 MCG tablet Commonly known as:  SYNTHROID, LEVOTHROID Take 50 mcg by mouth See admin instructions. Only on Tuesday, Thursday, Saturday, Sunday.   levothyroxine 75 MCG tablet Commonly known as:  SYNTHROID, LEVOTHROID Take 75 mcg by mouth See admin instructions. Monday; Friday   phenazopyridine 100 MG tablet Commonly known as:  PYRIDIUM Take 1 tablet (100 mg total) by mouth 3 (three) times daily with meals.   triamterene-hydrochlorothiazide 37.5-25 MG tablet Commonly known as:  MAXZIDE-25 Take 0.5 tablets by mouth daily.       Discharge Assessment: Vitals:    11/16/16 2157 11/17/16 0617  BP: (!) 149/64 (!) 177/86  Pulse: 76 88  Resp: 18 18  Temp: 97.9 F (36.6 C) 98.3 F (36.8 C)   Skin clean, dry and intact without evidence of skin break down, no evidence of skin tears noted. IV catheter discontinued intact. Site without signs and symptoms of complications - no redness or edema noted at insertion site, patient denies c/o pain - only slight tenderness at site.  Dressing with slight pressure applied.  D/c Instructions-Education: Discharge instructions given to patient/family with verbalized understanding. D/c education completed with patient/family including follow up instructions, medication list, d/c activities limitations if indicated, with other d/c instructions as indicated by MD - patient able to verbalize understanding, all questions fully answered. Patient instructed to return to ED, call 911, or call MD for any changes in condition.  Patient escorted via [place], and D/C to [place] via private auto. Report given to nurse at [facility] [place] 11/17/2016 11:48 AM

## 2016-11-17 NOTE — Progress Notes (Signed)
Physical Therapy Treatment Patient Details Name: Jamie Thompson MRN: [id number] DOB: Jun 15, 1925 Today's Date: 11/17/2016    History of Present Illness 83 y.o. female with h/o dementia, HTN, Afib admitted with UTI, fall, L 6-10 rib fxs.     PT Comments    Pt performed increased gait with LOB but able to self correct.  Pt disinterested in trying the cane to improve her function, therefore PTA educated daughter to stay close to her mom with ambulation until her balance improves.  Continue to recommend HHPT at this time until her balance improves.      Follow Up Recommendations  Home health PT;Supervision for mobility/OOB     Equipment Recommendations  Cane    Recommendations for Other Services       Precautions / Restrictions Precautions Precautions: Fall Precaution Comments: daughter reports pt with no other falls recently Restrictions Weight Bearing Restrictions: No    Mobility  Bed Mobility               General bed mobility comments: Pt OOB in recliner on arrival.    Transfers Overall transfer level: Needs assistance Equipment used: None (Brought cane to patient's room but she refused to use the cane.  ) Transfers: Sit to/from Stand Sit to Stand: Supervision         General transfer comment: Cues for hand placement to and from seated surface.    Ambulation/Gait Ambulation/Gait assistance: Min assist;Supervision Ambulation Distance (Feet): 200 Feet Assistive device: None Gait Pattern/deviations: Decreased stride length;Drifts right/left;Narrow base of support   Gait velocity interpretation: at or above normal speed for age/gender General Gait Details: Attempted to try cane, but patient disinterested in attempting despite education of benefits.  Pt presents with LOB to the R with horizontal head turns.  Educated daughter to stay close patient when staying with her to correct LOB if needed. When patient focuses on task she is better able to manage her balance.      Stairs            Wheelchair Mobility    Modified Rankin (Stroke Patients Only)       Balance Overall balance assessment: Needs assistance;History of Falls Sitting-balance support: Feet supported Sitting balance-Leahy Scale: Good       Standing balance-Leahy Scale: Fair                      Cognition Arousal/Alertness: Awake/alert Behavior During Therapy: WFL for tasks assessed/performed Overall Cognitive Status: History of cognitive impairments - at baseline                 General Comments: Pt HOH and requires repeating of questions.  Pt distracted by gait belt during ambulation and demonstrates LOB.  Pt perseverates on why she isn't wearing shoes and why she could walk better if she had her shoes on.      Exercises General Exercises - Lower Extremity Hip Flexion/Marching: AROM;Both;10 reps;Supine Heel Raises: AROM;Both;10 reps;Supine Mini-Sqauts: AROM;Both;10 reps;Supine    General Comments        Pertinent Vitals/Pain Pain Assessment: Faces Faces Pain Scale: Hurts a little bit Pain Location: L ribs Pain Descriptors / Indicators: Grimacing;Sore Pain Intervention(s): Monitored during session;Repositioned    Home Living                      Prior Function            PT Goals (current goals can now be found in the care  plan section) Acute Rehab PT Goals Patient Stated Goal: return to ALF at [facility] Potential to Achieve Goals: Good Progress towards PT goals: Progressing toward goals    Frequency    Min 3X/week      PT Plan Current plan remains appropriate    Co-evaluation             End of Session Equipment Utilized During Treatment: Gait belt Activity Tolerance: Patient tolerated treatment well;No increased pain Patient left: in chair;with call bell/phone within reach;with family/visitor present;with chair alarm set Nurse Communication: Mobility status PT Visit Diagnosis: History of falling  (Z91.81);Difficulty in walking, not elsewhere classified (R26.2)     Time: [id number] PT Time Calculation (min) (ACUTE ONLY): 16 min  Charges:  $Gait Training: 8-22 mins                    G Codes:       Cristela Blue 24-Nov-2016, 2:07 PM

## 2016-11-17 NOTE — Discharge Summary (Signed)
Physician Discharge Summary  CLARIECE YUNK [id number] DOB: 31-Oct-1924 DOA: 11/15/2016  PCP: Jeanmarie Hubert, MD  Admit date: 11/15/2016 Discharge date: 11/17/2016   Recommendations for Outpatient Follow-Up:   xarelto stopped in hospital after fall-- may need further discussion is this can be resumed or not Home health Ascending thoracic aorta measures 3.7 cm. Recommend annual imaging followup by CTA or MRA.   Discharge Diagnosis:   Principal Problem:   Rib fracture Active Problems:   Atrial fibrillation (HCC)   Hypothyroidism   HTN (hypertension)   UTI (urinary tract infection)   Fall   Discharge disposition:  ALF  Discharge Condition: Improved.  Diet recommendation: Low sodium, heart healthy  Wound care: None.   History of Present Illness:   Jamie Thompson is a 83 y.o. female  83 year old female with dementia, atrial fibrillation, and anticoagulation chronic, high blood pressure, hypothyroid presents with chest pain. Yesterday patient fell at her room landing left lower prior. Patient complained of chest pain at her assisted living facility and a chest x-ray was done. Chest x-ray was completed last night and the results came out this morning showing rib fractures. Patient immediately was sent to the emergency room for evaluation.  ED course: In the emergency room the patient was given morphine for pain which was effective. She also had a CT of the chest done which confirmed rib fractures. The EDP spoke to trauma surgeon on call who advised hospital admission given UTI. Hospitalist consulted for admission.   Hospital Course by Problem:   Multiple rib fractures secondary to fall Pulmonary toilet Per Dr. Grandville Silos: In light of her fall, we recommend D/C Xarelto   UTI- e coli -treat with ceftin  Hypothyroidism Cont OP synthroid 54mcg qd  Dementia Cont aricept  Hypertension Cont norvasc and coreg, maxzide  Afib -rate controlled -d/c xarelto per trauma  team-- PCP to discuss if it can be resumed  Hypokalemia -replete  Ascending thoracic aorta measures 3.7 cm. Recommend annual imaging followup by CTA or MRA.     Medical Consultants:    Trauma   Discharge Exam:   Vitals:   11/16/16 2157 11/17/16 0617  BP: (!) 149/64 (!) 177/86  Pulse: 76 88  Resp: 18 18  Temp: 97.9 F (36.6 C) 98.3 F (36.8 C)   Vitals:   11/16/16 0539 11/16/16 1343 11/16/16 2157 11/17/16 0617  BP: (!) 149/75 (!) 127/58 (!) 149/64 (!) 177/86  Pulse: 76 71 76 88  Resp: 18 18 18 18   Temp: 98.1 F (36.7 C) 98.7 F (37.1 C) 97.9 F (36.6 C) 98.3 F (36.8 C)  TempSrc: Oral  Oral Oral  SpO2: 99% 100% 97% 99%    Gen:  NAD- anxious to return to her home   The results of significant diagnostics from this hospitalization (including imaging, microbiology, ancillary and laboratory) are listed below for reference.     Procedures and Diagnostic Studies:   Ct Head Wo Contrast  Result Date: 11/15/2016 CLINICAL DATA:  83 year old female with history of trauma from a fall. Head and neck pain. EXAM: CT HEAD WITHOUT CONTRAST CT CERVICAL SPINE WITHOUT CONTRAST TECHNIQUE: Multidetector CT imaging of the head and cervical spine was performed following the standard protocol without intravenous contrast. Multiplanar CT image reconstructions of the cervical spine were also generated. COMPARISON:  None. FINDINGS: CT HEAD FINDINGS Brain: Mild cerebral atrophy. Patchy areas of decreased attenuation are noted throughout the deep and periventricular white matter of the cerebral hemispheres bilaterally, compatible with mild chronic microvascular  ischemic disease. No evidence of acute infarction, hemorrhage, hydrocephalus, extra-axial collection or mass lesion/mass effect. Vascular: Cerebrovascular atherosclerosis, without acute abnormalities. Skull: Normal. Negative for fracture or focal lesion. Sinuses/Orbits: No acute finding. Other: None. CT CERVICAL SPINE FINDINGS Alignment: 4  mm of anterolisthesis of C3 upon C4. 4 mm of anterolisthesis of C4 upon C5. These changes are likely chronic. Alignment is otherwise anatomic. Skull base and vertebrae: No acute displaced fracture. No aggressive osseous lesion identified. Soft tissues and spinal canal: No prevertebral fluid or swelling. No visible canal hematoma. Disc levels: Multilevel degenerative disc disease, most pronounced at C5-C6 and C6-C7. Moderate multilevel facet arthropathy. Upper chest: Areas of scarring in the visualize the apices of the lungs bilaterally. Background of mild diffuse ground-glass attenuation and interlobular septal thickening, suggestive of underlying pulmonary edema. Aortic atherosclerosis. Other: None. IMPRESSION: 1. No evidence of significant acute traumatic injury to the skull, brain or cervical spine. 2. Mild cerebral atrophy with chronic microvascular ischemic changes in the cerebral white matter, as above. 3. Multilevel degenerative disc disease and cervical spondylosis, as above. 4. Aortic atherosclerosis. Electronically Signed   By: Vinnie Langton M.D.   On: 11/15/2016 13:06   Ct Chest W Contrast  Result Date: 11/15/2016 CLINICAL DATA:  Patient status post fall hitting left ribs on [place]. EXAM: CT CHEST, ABDOMEN, AND PELVIS WITH CONTRAST TECHNIQUE: Multidetector CT imaging of the chest, abdomen and pelvis was performed following the standard protocol during bolus administration of intravenous contrast. CONTRAST:  152mL ISOVUE-300 IOPAMIDOL (ISOVUE-300) INJECTION 61% COMPARISON:  Chest radiograph 06/01/2016. FINDINGS: CT CHEST FINDINGS Cardiovascular: Heart is enlarged. No pericardial effusion. Ascending thoracic aorta measures 3.7 cm. Aortic vascular calcifications. Mediastinum/Nodes: No enlarged axillary, mediastinal or hilar lymphadenopathy. Small hiatal hernia. Lungs/Pleura: Central airways are patent. Dependent bandlike atelectasis within the left greater than right lower lobes. Small left pleural  effusion. No pneumothorax. Musculoskeletal: Mildly displaced comminuted fracture of the posterior left ninth rib (image 89; series 205). Nondisplaced fracture through the posterior tenth rib. Nondisplaced fractures through the lateral sixth, seventh and eighth left ribs. Thoracic spine degenerative changes. CT ABDOMEN PELVIS FINDINGS Hepatobiliary: There is a 2.0 cm cyst within the right hepatic lobe (image 60; series 201). Additional too small to characterize subcentimeter low-attenuation lesions within the left and right hepatic lobes. Within the hepatic dome there is a 1.6 cm flash filling lesion (image 49; series 201). Additionally, adjacent to the falciform ligament is a 3.7 cm area of increased attenuation/differential profusion (image 52; series 201). Pancreas: Unremarkable Spleen: Unremarkable Adrenals/Urinary Tract: The adrenal glands are normal. Kidneys enhance symmetrically with contrast. Bilateral renal cysts are demonstrated. Additionally there are too small to characterize low-attenuation lesions within the right kidney. There is mild-to-moderate dilatation of the right ureter to the level of the urinary bladder. No definitive obstructing lesion is identified. Urinary bladder is unremarkable. 2 mm nonobstructing stone superior pole right kidney (image 67; series 203). Stomach/Bowel: Sigmoid colonic diverticulosis. No CT evidence for acute diverticulitis. No abnormal bowel wall thickening or evidence for bowel obstruction. No free fluid or free intraperitoneal air. Normal morphology of the stomach. Vascular/Lymphatic: Infrarenal abdominal aortic ectasia measuring 1.7 cm. No retroperitoneal lymphadenopathy. Reproductive: Uterus and adnexal structures unremarkable. Other: None. Musculoskeletal: Lumbar spine degenerative changes. No aggressive or acute appearing osseous lesions. IMPRESSION: Multiple left-sided rib fractures as above. Small left pleural effusion. No definite left-sided pneumothorax.  Otherwise no traumatic visceral injury within the chest, abdomen or pelvis. There is mild-to-moderate dilatation of the right ureter to the level of  the urinary bladder without definitive obstructing lesion identified. Consider urologic consultation. Heterogeneous opacities left lung base favored to represent atelectasis. Hyperenhancing lesion within the hepatic dome with associated perfusional anomaly within the liver adjacent to the falciform ligament. In the absence of known malignancy, these are likely benign in etiology, potentially representing hemangiomas. Consider follow-up pre and post contrast-enhanced MRI in 6 months. Aortic atherosclerosis. Ascending thoracic aorta measures 3.7 cm. Recommend annual imaging followup by CTA or MRA. This recommendation follows 2010 ACCF/AHA/AATS/ACR/ASA/SCA/SCAI/SIR/STS/SVM Guidelines for the Diagnosis and Management of Patients with Thoracic Aortic Disease. Circulation.2010; [id number] Electronically Signed   By: Lovey Newcomer M.D.   On: 11/15/2016 13:22   Ct Cervical Spine Wo Contrast  Result Date: 11/15/2016 CLINICAL DATA:  83 year old female with history of trauma from a fall. Head and neck pain. EXAM: CT HEAD WITHOUT CONTRAST CT CERVICAL SPINE WITHOUT CONTRAST TECHNIQUE: Multidetector CT imaging of the head and cervical spine was performed following the standard protocol without intravenous contrast. Multiplanar CT image reconstructions of the cervical spine were also generated. COMPARISON:  None. FINDINGS: CT HEAD FINDINGS Brain: Mild cerebral atrophy. Patchy areas of decreased attenuation are noted throughout the deep and periventricular white matter of the cerebral hemispheres bilaterally, compatible with mild chronic microvascular ischemic disease. No evidence of acute infarction, hemorrhage, hydrocephalus, extra-axial collection or mass lesion/mass effect. Vascular: Cerebrovascular atherosclerosis, without acute abnormalities. Skull: Normal. Negative for  fracture or focal lesion. Sinuses/Orbits: No acute finding. Other: None. CT CERVICAL SPINE FINDINGS Alignment: 4 mm of anterolisthesis of C3 upon C4. 4 mm of anterolisthesis of C4 upon C5. These changes are likely chronic. Alignment is otherwise anatomic. Skull base and vertebrae: No acute displaced fracture. No aggressive osseous lesion identified. Soft tissues and spinal canal: No prevertebral fluid or swelling. No visible canal hematoma. Disc levels: Multilevel degenerative disc disease, most pronounced at C5-C6 and C6-C7. Moderate multilevel facet arthropathy. Upper chest: Areas of scarring in the visualize the apices of the lungs bilaterally. Background of mild diffuse ground-glass attenuation and interlobular septal thickening, suggestive of underlying pulmonary edema. Aortic atherosclerosis. Other: None. IMPRESSION: 1. No evidence of significant acute traumatic injury to the skull, brain or cervical spine. 2. Mild cerebral atrophy with chronic microvascular ischemic changes in the cerebral white matter, as above. 3. Multilevel degenerative disc disease and cervical spondylosis, as above. 4. Aortic atherosclerosis. Electronically Signed   By: Vinnie Langton M.D.   On: 11/15/2016 13:06   Ct Abdomen Pelvis W Contrast  Result Date: 11/15/2016 CLINICAL DATA:  Patient status post fall hitting left ribs on [place]. EXAM: CT CHEST, ABDOMEN, AND PELVIS WITH CONTRAST TECHNIQUE: Multidetector CT imaging of the chest, abdomen and pelvis was performed following the standard protocol during bolus administration of intravenous contrast. CONTRAST:  15mL ISOVUE-300 IOPAMIDOL (ISOVUE-300) INJECTION 61% COMPARISON:  Chest radiograph 06/01/2016. FINDINGS: CT CHEST FINDINGS Cardiovascular: Heart is enlarged. No pericardial effusion. Ascending thoracic aorta measures 3.7 cm. Aortic vascular calcifications. Mediastinum/Nodes: No enlarged axillary, mediastinal or hilar lymphadenopathy. Small hiatal hernia. Lungs/Pleura: Central  airways are patent. Dependent bandlike atelectasis within the left greater than right lower lobes. Small left pleural effusion. No pneumothorax. Musculoskeletal: Mildly displaced comminuted fracture of the posterior left ninth rib (image 89; series 205). Nondisplaced fracture through the posterior tenth rib. Nondisplaced fractures through the lateral sixth, seventh and eighth left ribs. Thoracic spine degenerative changes. CT ABDOMEN PELVIS FINDINGS Hepatobiliary: There is a 2.0 cm cyst within the right hepatic lobe (image 60; series 201). Additional too small to characterize subcentimeter low-attenuation  lesions within the left and right hepatic lobes. Within the hepatic dome there is a 1.6 cm flash filling lesion (image 49; series 201). Additionally, adjacent to the falciform ligament is a 3.7 cm area of increased attenuation/differential profusion (image 52; series 201). Pancreas: Unremarkable Spleen: Unremarkable Adrenals/Urinary Tract: The adrenal glands are normal. Kidneys enhance symmetrically with contrast. Bilateral renal cysts are demonstrated. Additionally there are too small to characterize low-attenuation lesions within the right kidney. There is mild-to-moderate dilatation of the right ureter to the level of the urinary bladder. No definitive obstructing lesion is identified. Urinary bladder is unremarkable. 2 mm nonobstructing stone superior pole right kidney (image 67; series 203). Stomach/Bowel: Sigmoid colonic diverticulosis. No CT evidence for acute diverticulitis. No abnormal bowel wall thickening or evidence for bowel obstruction. No free fluid or free intraperitoneal air. Normal morphology of the stomach. Vascular/Lymphatic: Infrarenal abdominal aortic ectasia measuring 1.7 cm. No retroperitoneal lymphadenopathy. Reproductive: Uterus and adnexal structures unremarkable. Other: None. Musculoskeletal: Lumbar spine degenerative changes. No aggressive or acute appearing osseous lesions. IMPRESSION:  Multiple left-sided rib fractures as above. Small left pleural effusion. No definite left-sided pneumothorax. Otherwise no traumatic visceral injury within the chest, abdomen or pelvis. There is mild-to-moderate dilatation of the right ureter to the level of the urinary bladder without definitive obstructing lesion identified. Consider urologic consultation. Heterogeneous opacities left lung base favored to represent atelectasis. Hyperenhancing lesion within the hepatic dome with associated perfusional anomaly within the liver adjacent to the falciform ligament. In the absence of known malignancy, these are likely benign in etiology, potentially representing hemangiomas. Consider follow-up pre and post contrast-enhanced MRI in 6 months. Aortic atherosclerosis. Ascending thoracic aorta measures 3.7 cm. Recommend annual imaging followup by CTA or MRA. This recommendation follows 2010 ACCF/AHA/AATS/ACR/ASA/SCA/SCAI/SIR/STS/SVM Guidelines for the Diagnosis and Management of Patients with Thoracic Aortic Disease. Circulation.2010; [id number] Electronically Signed   By: Lovey Newcomer M.D.   On: 11/15/2016 13:22     Labs:   Basic Metabolic Panel:  Recent Labs Lab 11/15/16 1335 11/16/16 0512 11/17/16 0550  NA 135 138 136  K 3.5 3.5 3.1*  CL 97* 103 100*  CO2 28 26 27   GLUCOSE 102* 93 108*  BUN 18 18 18   CREATININE 0.89 0.84 0.80  CALCIUM 9.4 9.2 9.3   GFR Estimated Creatinine Clearance: 42.9 mL/min (by C-G formula based on SCr of 0.8 mg/dL). Liver Function Tests:  Recent Labs Lab 11/15/16 1335  AST 23  ALT 18  ALKPHOS 71  BILITOT 0.9  PROT 6.7  ALBUMIN 4.1    Recent Labs Lab 11/15/16 1335  LIPASE 33   No results for input(s): AMMONIA in the last 168 hours. Coagulation profile  Recent Labs Lab 11/15/16 1335  INR 1.05    CBC:  Recent Labs Lab 11/15/16 1335 11/16/16 0512 11/17/16 0550  WBC 9.9 11.0* 10.7*  NEUTROABS 7.3  --   --   HGB 12.2 12.0 11.4*  HCT 37.3 37.3  35.2*  MCV 91.2 92.3 91.2  PLT 265 260 252   Cardiac Enzymes:  Recent Labs Lab 11/15/16 1616 11/15/16 2132  TROPONINI <0.03 <0.03   BNP: Invalid input(s): POCBNP CBG: No results for input(s): GLUCAP in the last 168 hours. D-Dimer No results for input(s): DDIMER in the last 72 hours. Hgb A1c No results for input(s): HGBA1C in the last 72 hours. Lipid Profile No results for input(s): CHOL, HDL, LDLCALC, TRIG, CHOLHDL, LDLDIRECT in the last 72 hours. Thyroid function studies No results for input(s): TSH, T4TOTAL, T3FREE, THYROIDAB  in the last 72 hours.  Invalid input(s): FREET3 Anemia work up No results for input(s): VITAMINB12, FOLATE, FERRITIN, TIBC, IRON, RETICCTPCT in the last 72 hours. Microbiology Recent Results (from the past 240 hour(s))  Urine culture     Status: Abnormal   Collection Time: 11/15/16 12:02 PM  Result Value Ref Range Status   Specimen Description URINE, CLEAN CATCH  Final   Special Requests NONE  Final   Culture >=100,000 COLONIES/mL ESCHERICHIA COLI (A)  Final   Report Status 11/17/2016 FINAL  Final   Organism ID, Bacteria ESCHERICHIA COLI (A)  Final      Susceptibility   Escherichia coli - MIC*    AMPICILLIN >=32 RESISTANT Resistant     CEFAZOLIN <=4 SENSITIVE Sensitive     CEFTRIAXONE <=1 SENSITIVE Sensitive     CIPROFLOXACIN <=0.25 SENSITIVE Sensitive     GENTAMICIN <=1 SENSITIVE Sensitive     IMIPENEM <=0.25 SENSITIVE Sensitive     NITROFURANTOIN <=16 SENSITIVE Sensitive     TRIMETH/SULFA <=20 SENSITIVE Sensitive     AMPICILLIN/SULBACTAM 16 INTERMEDIATE Intermediate     PIP/TAZO <=4 SENSITIVE Sensitive     Extended ESBL NEGATIVE Sensitive     * >=100,000 COLONIES/mL ESCHERICHIA COLI  MRSA PCR Screening     Status: None   Collection Time: 11/15/16  5:16 PM  Result Value Ref Range Status   MRSA by PCR NEGATIVE NEGATIVE Final    Comment:        The GeneXpert MRSA Assay (FDA approved for NASAL specimens only), is one component of  a comprehensive MRSA colonization surveillance program. It is not intended to diagnose MRSA infection nor to guide or monitor treatment for MRSA infections.      Discharge Instructions:   Discharge Instructions    Diet - low sodium heart healthy    Complete by:  As directed    Discharge instructions    Complete by:  As directed    Family to stay for several days once discharged Home health PT ordered Incentive spirometry for broken ribs to prevent pneumonia   Increase activity slowly    Complete by:  As directed      Allergies as of 11/17/2016      Reactions   Amitriptyline    Urinary Retention   Amlodipine Swelling   Erythromycin Hives   Nitrofurantoin Nausea Only   Sulfa Antibiotics    "really ill"   Tavist [clemastine]    Urinary hesitancy   Trimethoprim    Cramps   Verapamil    Constipation      Medication List    STOP taking these medications   Rivaroxaban 15 MG Tabs tablet Commonly known as:  XARELTO     TAKE these medications   acetaminophen 325 MG tablet Commonly known as:  TYLENOL Take 650 mg by mouth every 6 (six) hours as needed for headache.   amLODipine 2.5 MG tablet Commonly known as:  NORVASC Take 2.5 mg by mouth daily.   CALCIUM-D PO Take 1 tablet by mouth daily.   carvedilol 6.25 MG tablet Commonly known as:  COREG Take 1 tablet (6.25 mg total) by mouth 2 (two) times daily with a meal.   cefUROXime 250 MG tablet Commonly known as:  CEFTIN Take 1 tablet (250 mg total) by mouth 2 (two) times daily with a meal.   donepezil 10 MG tablet Commonly known as:  ARICEPT Take 10 mg by mouth at bedtime.   levothyroxine 50 MCG tablet Commonly known as:  SYNTHROID,  LEVOTHROID Take 50 mcg by mouth See admin instructions. Only on Tuesday, Thursday, Saturday, Sunday.   levothyroxine 75 MCG tablet Commonly known as:  SYNTHROID, LEVOTHROID Take 75 mcg by mouth See admin instructions. Monday; Friday   phenazopyridine 100 MG tablet Commonly  known as:  PYRIDIUM Take 1 tablet (100 mg total) by mouth 3 (three) times daily with meals.   triamterene-hydrochlorothiazide 37.5-25 MG tablet Commonly known as:  MAXZIDE-25 Take 0.5 tablets by mouth daily.      Follow-up Information    Jeanmarie Hubert, MD Follow up in 1 week(s).   Specialty:  Internal Medicine Why:  BMP Contact information: [place] [address] [phone]            Time coordinating discharge: 35 min  Signed:  Kacey Vicuna U Orion Mole   [facility] 11/17/2016, 8:54 AM

## 2016-11-18 ENCOUNTER — Telehealth: Payer: Self-pay

## 2016-11-18 NOTE — Telephone Encounter (Signed)
Possible re-admission to facility. This is a patient you were seeing at [facility]. [facility] F/U is needed if patient was re-admitted to facility upon discharge. Hospital discharge from [facility] on 11/17/16.

## 2016-11-20 ENCOUNTER — Encounter: Payer: Self-pay | Admitting: Internal Medicine

## 2016-11-20 ENCOUNTER — Non-Acute Institutional Stay (SKILLED_NURSING_FACILITY): Payer: Medicare Other | Admitting: Internal Medicine

## 2016-11-20 DIAGNOSIS — E039 Hypothyroidism, unspecified: Secondary | ICD-10-CM | POA: Diagnosis not present

## 2016-11-20 DIAGNOSIS — F0390 Unspecified dementia without behavioral disturbance: Secondary | ICD-10-CM | POA: Diagnosis not present

## 2016-11-20 DIAGNOSIS — I482 Chronic atrial fibrillation, unspecified: Secondary | ICD-10-CM

## 2016-11-20 DIAGNOSIS — I10 Essential (primary) hypertension: Secondary | ICD-10-CM

## 2016-11-20 DIAGNOSIS — N3000 Acute cystitis without hematuria: Secondary | ICD-10-CM

## 2016-11-20 DIAGNOSIS — S2242XA Multiple fractures of ribs, left side, initial encounter for closed fracture: Secondary | ICD-10-CM

## 2016-11-20 NOTE — Progress Notes (Signed)
History and Physical     Location:  [facility] Room Number: [phone] Place of Service:  ALF [phone])  PCP: Jeanmarie Hubert, MD Patient Care Team: Estill Dooms, MD as PCP - General (Internal Medicine) Man Otho Darner, NP as Nurse Practitioner (Internal Medicine) [name], MD as Attending Physician (Neurology)  Extended Emergency Contact Information Primary Emergency Contact: Key,Caroline Address: [place] Phone: [phone] Mobile Phone: [phone] Relation: Daughter Secondary Emergency Contact: Key,Tim  [place] Mobile Phone: [phone] Relation: Son  Code Status: DNR Goals of Care: Advanced Directive information Advanced Directives 11/20/2016  Does Patient Have a Medical Advance Directive? Yes  Type of Paramedic of [place];Living will;Out of facility DNR (pink MOST or yellow form)  Copy of [place] in Chart? Yes  Would patient like information on creating a medical advance directive? -  Pre-existing out of facility DNR order (yellow form or pink MOST form) Yellow form placed in chart (order not valid for inpatient use)      Chief Complaint  Patient presents with  . Readmit To AL    following hospitalization 11/15/16 to 11/17/16 multiple rib fractures secondary to fall.   HPI  [name] is admitted to the [facility] AL following a hospitalization from 10/15/16 to 11/17/16. She fell and broke ribs on the left side. Rib pains are improving  She also has UTI and is asymptomatic,  She has a long complemented medical history.  She was admitted to the AL 08/31/16 by DR. Pharr. She has progressive dementia.She is still not fully adapted to the unit. Lived in [place] previously.  She has UTI found in the hospital. Treated with cefuroxine,  She has rate controlled AF. Eliquis was stopped in hospital due to risk of fall,  Past  Medical History:  Diagnosis Date  . Adenomatous colon polyp 07/17/97  . Allergic rhinitis   . Allergy   . Anxiety disorder   . Atrial fibrillation ([place])   . Chronic anticoagulation   . Diverticulosis   . History of shingles   . Hypercholesteremia   . Hypertension   . Hypothyroidism   . Internal hemorrhoids   . Melanoma ([place])   . Memory loss   . Meniere disease   . Tension headache    Past Surgical History:  Procedure Laterality Date  . CATARACT EXTRACTION Right 2012    reports that she has never smoked. She has never used smokeless tobacco. She reports that she does not drink alcohol or use drugs. Social History   Social History  . Marital status: Single    Spouse name: N/A  . Number of children: 2  . Years of education: 31yr colg   Occupational History  . Retired Scientist, research (medical)    Social History Main Topics  . Smoking status: Never Smoker  . Smokeless tobacco: Never Used  . Alcohol use No  . Drug use: No  . Sexual activity: No   Other Topics Concern  . Not on file   Social History Narrative   1 cup of coffee a day, occasionally drinks tea or soda   Social History     Marital status: Widowed             Spouse name:  Years of education:        Number of children: 2               Occupational History   Occupation : Surveyor, mining            Comment                Retired                                       Social History Main Topics     Smoking status: Never Smoker                                                                  Smokeless tobacco: Never Used                         Alcohol use: No               Drug use: No               Sexual activity: Not on file            Other Topics            Concern     None on file      Social History Narrative     1 cup of coffee a day, occasionally drinks tea or soda.   She does not exercise, has a living will, DNR and POA   Lives at [facility] Status Survey:    Family History  Problem Relation Age of Onset  . Stroke Father   . Clotting disorder Father   . Colon cancer Mother   . Colon cancer Sister   . Clotting disorder Sister     Health Maintenance  Topic Date Due  . Samul Dada  04/28/1944  . DEXA SCAN  04/28/1992  . PNA vac Low Risk Adult (1 of 2 - PCV13) 04/28/1992  . INFLUENZA VACCINE  04/28/2016    Allergies  Allergen Reactions  . Amitriptyline     Urinary Retention  . Amlodipine Swelling  . Erythromycin Hives  . Nitrofurantoin Nausea Only  . Sulfa Antibiotics     "really ill"  . Tavist [Clemastine]     Urinary hesitancy  . Trimethoprim     Cramps  . Verapamil     Constipation    Allergies as of 11/20/2016      Reactions   Amitriptyline    Urinary Retention   Amlodipine Swelling   Erythromycin Hives   Nitrofurantoin Nausea Only   Sulfa Antibiotics    "really ill"   Tavist [clemastine]    Urinary hesitancy   Trimethoprim    Cramps   Verapamil    Constipation      Medication List       Accurate as of 11/20/16  9:53 AM. Always use your most recent med list.          acetaminophen 325 MG tablet Commonly known as:  TYLENOL Take 650 mg by mouth every 6 (six) hours as needed for headache.   amLODipine 2.5 MG tablet Commonly known as:  NORVASC Take 2.5 mg by mouth daily.   CALCIUM-D PO Take 1 tablet by mouth daily.   carvedilol 6.25 MG tablet Commonly known as:  COREG Take 1 tablet (6.25 mg total) by mouth 2 (two) times daily with a meal.   cefUROXime 250 MG tablet Commonly known as:  CEFTIN Take 1 tablet (250 mg total) by mouth 2 (two) times daily with a meal.   donepezil 10 MG tablet Commonly known as:  ARICEPT Take 10 mg by mouth at bedtime.   HYDROcodone-acetaminophen 5-325 MG tablet Commonly known as:  NORCO/VICODIN Take by mouth. Take 1/2 tablet twice a day for pain   levothyroxine 50 MCG tablet Commonly known as:  SYNTHROID, LEVOTHROID Take 50  mcg by mouth See admin instructions. Only on Tuesday, Thursday, Saturday, Sunday.   levothyroxine 75 MCG tablet Commonly known as:  SYNTHROID, LEVOTHROID Take 75 mcg by mouth. Take one tablet Monday and Friday   phenazopyridine 100 MG tablet Commonly known as:  PYRIDIUM Take 1 tablet (100 mg total) by mouth 3 (three) times daily with meals.   triamterene-hydrochlorothiazide 37.5-25 MG tablet Commonly known as:  MAXZIDE-25 Take 0.5 tablets by mouth daily.       Review of Systems  Constitutional: Negative for activity change, appetite change, chills, diaphoresis, fatigue and fever.  HENT: Positive for hearing loss. Negative for congestion, dental problem, drooling, ear discharge, ear pain, facial swelling, mouth sores, nosebleeds, postnasal drip, rhinorrhea, sinus pain, sinus pressure, sneezing, sore throat, tinnitus, trouble swallowing and voice change.        HOH, hearing aid left  Eyes: Negative for photophobia, pain, discharge, redness, itching and visual disturbance.  Respiratory: Negative for apnea, cough, choking, chest tightness, shortness of breath, wheezing and stridor.        Tender left lower ribs  Cardiovascular: Negative for chest pain, palpitations and leg swelling.  Gastrointestinal: Negative for abdominal distention, abdominal pain, anal bleeding, blood in stool, constipation, diarrhea, nausea and vomiting.  Endocrine: Negative for cold intolerance, heat intolerance, polydipsia, polyphagia and polyuria.  Genitourinary: Negative for difficulty urinating, dysuria, flank pain, frequency, pelvic pain and urgency.  Musculoskeletal: Positive for gait problem. Negative for arthralgias, back pain, joint swelling, myalgias and neck pain.  Skin: Negative for color change, pallor, rash and wound.  Allergic/Immunologic: Negative for environmental allergies, food allergies and immunocompromised state.  Neurological: Negative for dizziness, tremors, seizures, syncope, facial  asymmetry, speech difficulty, weakness, light-headedness, numbness and headaches.  Hematological: Negative for adenopathy. Bruises/bleeds easily.  Psychiatric/Behavioral: Positive for confusion and decreased concentration. Negative for agitation, dysphoric mood, hallucinations, sleep disturbance and suicidal ideas. The patient is not nervous/anxious and is not hyperactive.     Vitals:   11/20/16 0942  BP: (!) 156/88  Pulse: 72  Resp: 18  Temp: (!) 96.8 F (36 C)  Weight: 128 lb 12.8 oz (58.4 kg)  Height: 5\' 5"  (1.651 m)   Body mass index is 21.43 kg/m. Physical Exam  Constitutional: She appears well-developed and well-nourished. No distress.  HENT:  Head: Normocephalic and atraumatic.  Right Ear: External ear normal.  Left Ear: External ear normal.  Mouth/Throat: Oropharynx is clear and moist.  Eyes: EOM are normal. Pupils are equal, round, and reactive to light. Right eye exhibits no discharge. Left eye exhibits no discharge. No scleral icterus.  Neck: Normal range of motion. Neck supple. No JVD present.  No tracheal deviation present. No thyromegaly present.  Cardiovascular: Normal rate, regular rhythm and intact distal pulses.  Exam reveals no gallop and no friction rub.   No murmur heard. Afib, heart rate is in control.   Pulmonary/Chest: Effort normal and breath sounds normal. No stridor. No respiratory distress. She has no wheezes. She has no rales. She exhibits tenderness (left loweer lateral ).  Abdominal: Soft. Bowel sounds are normal. She exhibits no distension. There is no tenderness. There is no rebound and no guarding.  Musculoskeletal: Normal range of motion. She exhibits no edema, tenderness or deformity.  Neurological: She is alert. She has normal reflexes. She displays normal reflexes. A cranial nerve deficit is present. She exhibits normal muscle tone. Coordination normal.  Jan 2018 MMSE 20/30. Failed clock drawing.  Skin: Skin is warm and dry. She is not  diaphoretic.  Psychiatric:  Dementia, confusion, irritated when unable to remember things.     Labs reviewed: Basic Metabolic Panel:  Recent Labs  11/15/16 1335 11/16/16 0512 11/17/16 0550  NA 135 138 136  K 3.5 3.5 3.1*  CL 97* 103 100*  CO2 28 26 27   GLUCOSE 102* 93 108*  BUN 18 18 18   CREATININE 0.89 0.84 0.80  CALCIUM 9.4 9.2 9.3   Liver Function Tests:  Recent Labs  11/10/16 11/15/16 1335  AST 17 23  ALT 13 18  ALKPHOS 79 71  BILITOT  --  0.9  PROT  --  6.7  ALBUMIN  --  4.1    Recent Labs  11/15/16 1335  LIPASE 33   No results for input(s): AMMONIA in the last 8760 hours. CBC:  Recent Labs  06/01/16 0950  11/15/16 1335 11/16/16 0512 11/17/16 0550  WBC 7.0  < > 9.9 11.0* 10.7*  NEUTROABS 5.2  --  7.3  --   --   HGB 12.0  < > 12.2 12.0 11.4*  HCT 37.3  < > 37.3 37.3 35.2*  MCV 93.5  < > 91.2 92.3 91.2  PLT 209  < > 265 260 252  < > = values in this interval not displayed. Cardiac Enzymes:  Recent Labs  06/02/16 0150 11/15/16 1616 11/15/16 2132  TROPONINI <0.03 <0.03 <0.03   BNP: Invalid input(s): POCBNP Lab Results  Component Value Date   HGBA1C 5.7 11/10/2016   Lab Results  Component Value Date   TSH 4.412 06/01/2016   No results found for: VITAMINB12 No results found for: FOLATE No results found for: IRON, TIBC, FERRITIN  Imaging and Procedures obtained prior to SNF admission: Ct Head Wo Contrast  Result Date: 11/15/2016 CLINICAL DATA:  83 year old female with history of trauma from a fall. Head and neck pain. EXAM: CT HEAD WITHOUT CONTRAST CT CERVICAL SPINE WITHOUT CONTRAST TECHNIQUE: Multidetector CT imaging of the head and cervical spine was performed following the standard protocol without intravenous contrast. Multiplanar CT image reconstructions of the cervical spine were also generated. COMPARISON:  None. FINDINGS: CT HEAD FINDINGS Brain: Mild cerebral atrophy. Patchy areas of decreased attenuation are noted throughout the  deep and periventricular white matter of the cerebral hemispheres bilaterally, compatible with mild chronic microvascular ischemic disease. No evidence of acute infarction, hemorrhage, hydrocephalus, extra-axial collection or mass lesion/mass effect. Vascular: Cerebrovascular atherosclerosis, without acute abnormalities. Skull: Normal. Negative for fracture or focal lesion. Sinuses/Orbits: No acute finding. Other: None. CT CERVICAL SPINE FINDINGS Alignment: 4 mm of anterolisthesis of C3 upon C4. 4 mm of anterolisthesis of C4 upon C5. These changes are likely chronic.  Alignment is otherwise anatomic. Skull base and vertebrae: No acute displaced fracture. No aggressive osseous lesion identified. Soft tissues and spinal canal: No prevertebral fluid or swelling. No visible canal hematoma. Disc levels: Multilevel degenerative disc disease, most pronounced at C5-C6 and C6-C7. Moderate multilevel facet arthropathy. Upper chest: Areas of scarring in the visualize the apices of the lungs bilaterally. Background of mild diffuse ground-glass attenuation and interlobular septal thickening, suggestive of underlying pulmonary edema. Aortic atherosclerosis. Other: None. IMPRESSION: 1. No evidence of significant acute traumatic injury to the skull, brain or cervical spine. 2. Mild cerebral atrophy with chronic microvascular ischemic changes in the cerebral white matter, as above. 3. Multilevel degenerative disc disease and cervical spondylosis, as above. 4. Aortic atherosclerosis. Electronically Signed   By: Vinnie Langton M.D.   On: 11/15/2016 13:06   Ct Chest W Contrast  Result Date: 11/15/2016 CLINICAL DATA:  Patient status post fall hitting left ribs on [place]. EXAM: CT CHEST, ABDOMEN, AND PELVIS WITH CONTRAST TECHNIQUE: Multidetector CT imaging of the chest, abdomen and pelvis was performed following the standard protocol during bolus administration of intravenous contrast. CONTRAST:  166mL ISOVUE-300 IOPAMIDOL  (ISOVUE-300) INJECTION 61% COMPARISON:  Chest radiograph 06/01/2016. FINDINGS: CT CHEST FINDINGS Cardiovascular: Heart is enlarged. No pericardial effusion. Ascending thoracic aorta measures 3.7 cm. Aortic vascular calcifications. Mediastinum/Nodes: No enlarged axillary, mediastinal or hilar lymphadenopathy. Small hiatal hernia. Lungs/Pleura: Central airways are patent. Dependent bandlike atelectasis within the left greater than right lower lobes. Small left pleural effusion. No pneumothorax. Musculoskeletal: Mildly displaced comminuted fracture of the posterior left ninth rib (image 89; series 205). Nondisplaced fracture through the posterior tenth rib. Nondisplaced fractures through the lateral sixth, seventh and eighth left ribs. Thoracic spine degenerative changes. CT ABDOMEN PELVIS FINDINGS Hepatobiliary: There is a 2.0 cm cyst within the right hepatic lobe (image 60; series 201). Additional too small to characterize subcentimeter low-attenuation lesions within the left and right hepatic lobes. Within the hepatic dome there is a 1.6 cm flash filling lesion (image 49; series 201). Additionally, adjacent to the falciform ligament is a 3.7 cm area of increased attenuation/differential profusion (image 52; series 201). Pancreas: Unremarkable Spleen: Unremarkable Adrenals/Urinary Tract: The adrenal glands are normal. Kidneys enhance symmetrically with contrast. Bilateral renal cysts are demonstrated. Additionally there are too small to characterize low-attenuation lesions within the right kidney. There is mild-to-moderate dilatation of the right ureter to the level of the urinary bladder. No definitive obstructing lesion is identified. Urinary bladder is unremarkable. 2 mm nonobstructing stone superior pole right kidney (image 67; series 203). Stomach/Bowel: Sigmoid colonic diverticulosis. No CT evidence for acute diverticulitis. No abnormal bowel wall thickening or evidence for bowel obstruction. No free fluid or  free intraperitoneal air. Normal morphology of the stomach. Vascular/Lymphatic: Infrarenal abdominal aortic ectasia measuring 1.7 cm. No retroperitoneal lymphadenopathy. Reproductive: Uterus and adnexal structures unremarkable. Other: None. Musculoskeletal: Lumbar spine degenerative changes. No aggressive or acute appearing osseous lesions. IMPRESSION: Multiple left-sided rib fractures as above. Small left pleural effusion. No definite left-sided pneumothorax. Otherwise no traumatic visceral injury within the chest, abdomen or pelvis. There is mild-to-moderate dilatation of the right ureter to the level of the urinary bladder without definitive obstructing lesion identified. Consider urologic consultation. Heterogeneous opacities left lung base favored to represent atelectasis. Hyperenhancing lesion within the hepatic dome with associated perfusional anomaly within the liver adjacent to the falciform ligament. In the absence of known malignancy, these are likely benign in etiology, potentially representing hemangiomas. Consider follow-up pre and post contrast-enhanced MRI in 6  months. Aortic atherosclerosis. Ascending thoracic aorta measures 3.7 cm. Recommend annual imaging followup by CTA or MRA. This recommendation follows 2010 ACCF/AHA/AATS/ACR/ASA/SCA/SCAI/SIR/STS/SVM Guidelines for the Diagnosis and Management of Patients with Thoracic Aortic Disease. Circulation.2010; [id number] Electronically Signed   By: Lovey Newcomer M.D.   On: 11/15/2016 13:22   Ct Cervical Spine Wo Contrast  Result Date: 11/15/2016 CLINICAL DATA:  83 year old female with history of trauma from a fall. Head and neck pain. EXAM: CT HEAD WITHOUT CONTRAST CT CERVICAL SPINE WITHOUT CONTRAST TECHNIQUE: Multidetector CT imaging of the head and cervical spine was performed following the standard protocol without intravenous contrast. Multiplanar CT image reconstructions of the cervical spine were also generated. COMPARISON:  None. FINDINGS:  CT HEAD FINDINGS Brain: Mild cerebral atrophy. Patchy areas of decreased attenuation are noted throughout the deep and periventricular white matter of the cerebral hemispheres bilaterally, compatible with mild chronic microvascular ischemic disease. No evidence of acute infarction, hemorrhage, hydrocephalus, extra-axial collection or mass lesion/mass effect. Vascular: Cerebrovascular atherosclerosis, without acute abnormalities. Skull: Normal. Negative for fracture or focal lesion. Sinuses/Orbits: No acute finding. Other: None. CT CERVICAL SPINE FINDINGS Alignment: 4 mm of anterolisthesis of C3 upon C4. 4 mm of anterolisthesis of C4 upon C5. These changes are likely chronic. Alignment is otherwise anatomic. Skull base and vertebrae: No acute displaced fracture. No aggressive osseous lesion identified. Soft tissues and spinal canal: No prevertebral fluid or swelling. No visible canal hematoma. Disc levels: Multilevel degenerative disc disease, most pronounced at C5-C6 and C6-C7. Moderate multilevel facet arthropathy. Upper chest: Areas of scarring in the visualize the apices of the lungs bilaterally. Background of mild diffuse ground-glass attenuation and interlobular septal thickening, suggestive of underlying pulmonary edema. Aortic atherosclerosis. Other: None. IMPRESSION: 1. No evidence of significant acute traumatic injury to the skull, brain or cervical spine. 2. Mild cerebral atrophy with chronic microvascular ischemic changes in the cerebral white matter, as above. 3. Multilevel degenerative disc disease and cervical spondylosis, as above. 4. Aortic atherosclerosis. Electronically Signed   By: Vinnie Langton M.D.   On: 11/15/2016 13:06   Ct Abdomen Pelvis W Contrast  Result Date: 11/15/2016 CLINICAL DATA:  Patient status post fall hitting left ribs on [place]. EXAM: CT CHEST, ABDOMEN, AND PELVIS WITH CONTRAST TECHNIQUE: Multidetector CT imaging of the chest, abdomen and pelvis was performed following the  standard protocol during bolus administration of intravenous contrast. CONTRAST:  114mL ISOVUE-300 IOPAMIDOL (ISOVUE-300) INJECTION 61% COMPARISON:  Chest radiograph 06/01/2016. FINDINGS: CT CHEST FINDINGS Cardiovascular: Heart is enlarged. No pericardial effusion. Ascending thoracic aorta measures 3.7 cm. Aortic vascular calcifications. Mediastinum/Nodes: No enlarged axillary, mediastinal or hilar lymphadenopathy. Small hiatal hernia. Lungs/Pleura: Central airways are patent. Dependent bandlike atelectasis within the left greater than right lower lobes. Small left pleural effusion. No pneumothorax. Musculoskeletal: Mildly displaced comminuted fracture of the posterior left ninth rib (image 89; series 205). Nondisplaced fracture through the posterior tenth rib. Nondisplaced fractures through the lateral sixth, seventh and eighth left ribs. Thoracic spine degenerative changes. CT ABDOMEN PELVIS FINDINGS Hepatobiliary: There is a 2.0 cm cyst within the right hepatic lobe (image 60; series 201). Additional too small to characterize subcentimeter low-attenuation lesions within the left and right hepatic lobes. Within the hepatic dome there is a 1.6 cm flash filling lesion (image 49; series 201). Additionally, adjacent to the falciform ligament is a 3.7 cm area of increased attenuation/differential profusion (image 52; series 201). Pancreas: Unremarkable Spleen: Unremarkable Adrenals/Urinary Tract: The adrenal glands are normal. Kidneys enhance symmetrically with contrast. Bilateral renal cysts  are demonstrated. Additionally there are too small to characterize low-attenuation lesions within the right kidney. There is mild-to-moderate dilatation of the right ureter to the level of the urinary bladder. No definitive obstructing lesion is identified. Urinary bladder is unremarkable. 2 mm nonobstructing stone superior pole right kidney (image 67; series 203). Stomach/Bowel: Sigmoid colonic diverticulosis. No CT evidence for  acute diverticulitis. No abnormal bowel wall thickening or evidence for bowel obstruction. No free fluid or free intraperitoneal air. Normal morphology of the stomach. Vascular/Lymphatic: Infrarenal abdominal aortic ectasia measuring 1.7 cm. No retroperitoneal lymphadenopathy. Reproductive: Uterus and adnexal structures unremarkable. Other: None. Musculoskeletal: Lumbar spine degenerative changes. No aggressive or acute appearing osseous lesions. IMPRESSION: Multiple left-sided rib fractures as above. Small left pleural effusion. No definite left-sided pneumothorax. Otherwise no traumatic visceral injury within the chest, abdomen or pelvis. There is mild-to-moderate dilatation of the right ureter to the level of the urinary bladder without definitive obstructing lesion identified. Consider urologic consultation. Heterogeneous opacities left lung base favored to represent atelectasis. Hyperenhancing lesion within the hepatic dome with associated perfusional anomaly within the liver adjacent to the falciform ligament. In the absence of known malignancy, these are likely benign in etiology, potentially representing hemangiomas. Consider follow-up pre and post contrast-enhanced MRI in 6 months. Aortic atherosclerosis. Ascending thoracic aorta measures 3.7 cm. Recommend annual imaging followup by CTA or MRA. This recommendation follows 2010 ACCF/AHA/AATS/ACR/ASA/SCA/SCAI/SIR/STS/SVM Guidelines for the Diagnosis and Management of Patients with Thoracic Aortic Disease. Circulation.2010; [id number] Electronically Signed   By: Lovey Newcomer M.D.   On: 11/15/2016 13:22    Assessment/Plan 1. Closed fracture of multiple ribs of left side, initial encounter I believe this will heal and all ains will resolve  2. Dementia without behavioral disturbance, unspecified dementia type Continue donepezil  3. Chronic atrial fibrillation (HCC) Remain off Eliquis due to risk of bleeding with injuries and currently unstable  gait  4. Hypertension, unspecified type Mild elevation today. Will contiine to monitor.  5. Hypothyroidism, unspecified type comprnsated  6. Acute cystitis without hematuria Finish cefuroxime.

## 2016-11-24 ENCOUNTER — Telehealth: Payer: Self-pay | Admitting: *Deleted

## 2016-11-24 DIAGNOSIS — I10 Essential (primary) hypertension: Secondary | ICD-10-CM | POA: Diagnosis not present

## 2016-11-24 NOTE — Telephone Encounter (Signed)
[place], daughter called and stated that she needs a Physician statement. [place] 2 weeks ago and she is working with the [employer] and they need a letter stating patient is mentally incompetent to make medical decisions on her own behalf. Wants letter mailed to her at : [facility] [place] [address]

## 2016-11-26 DIAGNOSIS — M6281 Muscle weakness (generalized): Secondary | ICD-10-CM | POA: Diagnosis not present

## 2016-11-26 DIAGNOSIS — E039 Hypothyroidism, unspecified: Secondary | ICD-10-CM | POA: Diagnosis not present

## 2016-11-26 DIAGNOSIS — S2242XS Multiple fractures of ribs, left side, sequela: Secondary | ICD-10-CM | POA: Diagnosis not present

## 2016-11-26 DIAGNOSIS — R2689 Other abnormalities of gait and mobility: Secondary | ICD-10-CM | POA: Diagnosis not present

## 2016-11-26 DIAGNOSIS — G3184 Mild cognitive impairment, so stated: Secondary | ICD-10-CM | POA: Diagnosis not present

## 2016-11-26 DIAGNOSIS — S2242XD Multiple fractures of ribs, left side, subsequent encounter for fracture with routine healing: Secondary | ICD-10-CM | POA: Diagnosis not present

## 2016-11-26 DIAGNOSIS — I10 Essential (primary) hypertension: Secondary | ICD-10-CM | POA: Diagnosis not present

## 2016-11-26 DIAGNOSIS — I4891 Unspecified atrial fibrillation: Secondary | ICD-10-CM | POA: Diagnosis not present

## 2016-11-26 DIAGNOSIS — Z7901 Long term (current) use of anticoagulants: Secondary | ICD-10-CM | POA: Diagnosis not present

## 2016-11-26 DIAGNOSIS — Z9181 History of falling: Secondary | ICD-10-CM | POA: Diagnosis not present

## 2016-11-26 DIAGNOSIS — R41841 Cognitive communication deficit: Secondary | ICD-10-CM | POA: Diagnosis not present

## 2016-11-26 DIAGNOSIS — H8109 Meniere's disease, unspecified ear: Secondary | ICD-10-CM | POA: Diagnosis not present

## 2016-11-26 DIAGNOSIS — R29898 Other symptoms and signs involving the musculoskeletal system: Secondary | ICD-10-CM | POA: Diagnosis not present

## 2016-11-27 DIAGNOSIS — S2242XS Multiple fractures of ribs, left side, sequela: Secondary | ICD-10-CM | POA: Diagnosis not present

## 2016-11-27 DIAGNOSIS — M6281 Muscle weakness (generalized): Secondary | ICD-10-CM | POA: Diagnosis not present

## 2016-11-27 DIAGNOSIS — Z9181 History of falling: Secondary | ICD-10-CM | POA: Diagnosis not present

## 2016-11-27 DIAGNOSIS — R2689 Other abnormalities of gait and mobility: Secondary | ICD-10-CM | POA: Diagnosis not present

## 2016-11-27 DIAGNOSIS — R29898 Other symptoms and signs involving the musculoskeletal system: Secondary | ICD-10-CM | POA: Diagnosis not present

## 2016-11-27 DIAGNOSIS — R41841 Cognitive communication deficit: Secondary | ICD-10-CM | POA: Diagnosis not present

## 2016-11-30 DIAGNOSIS — R29898 Other symptoms and signs involving the musculoskeletal system: Secondary | ICD-10-CM | POA: Diagnosis not present

## 2016-11-30 DIAGNOSIS — S2242XS Multiple fractures of ribs, left side, sequela: Secondary | ICD-10-CM | POA: Diagnosis not present

## 2016-11-30 DIAGNOSIS — R41841 Cognitive communication deficit: Secondary | ICD-10-CM | POA: Diagnosis not present

## 2016-11-30 DIAGNOSIS — R2689 Other abnormalities of gait and mobility: Secondary | ICD-10-CM | POA: Diagnosis not present

## 2016-11-30 DIAGNOSIS — M6281 Muscle weakness (generalized): Secondary | ICD-10-CM | POA: Diagnosis not present

## 2016-11-30 DIAGNOSIS — Z9181 History of falling: Secondary | ICD-10-CM | POA: Diagnosis not present

## 2016-12-01 ENCOUNTER — Encounter: Payer: Self-pay | Admitting: Internal Medicine

## 2016-12-01 DIAGNOSIS — Z9181 History of falling: Secondary | ICD-10-CM | POA: Diagnosis not present

## 2016-12-01 DIAGNOSIS — R2689 Other abnormalities of gait and mobility: Secondary | ICD-10-CM | POA: Diagnosis not present

## 2016-12-01 DIAGNOSIS — R29898 Other symptoms and signs involving the musculoskeletal system: Secondary | ICD-10-CM | POA: Diagnosis not present

## 2016-12-01 DIAGNOSIS — S2242XS Multiple fractures of ribs, left side, sequela: Secondary | ICD-10-CM | POA: Diagnosis not present

## 2016-12-01 DIAGNOSIS — M6281 Muscle weakness (generalized): Secondary | ICD-10-CM | POA: Diagnosis not present

## 2016-12-01 DIAGNOSIS — R41841 Cognitive communication deficit: Secondary | ICD-10-CM | POA: Diagnosis not present

## 2016-12-01 NOTE — Progress Notes (Signed)
Letter requested regarding patient competency.

## 2016-12-02 NOTE — Telephone Encounter (Signed)
Letter mailed. Daughter notified.

## 2016-12-03 ENCOUNTER — Ambulatory Visit: Payer: Medicare Other | Admitting: Adult Health

## 2016-12-03 ENCOUNTER — Encounter: Payer: Self-pay | Admitting: Nurse Practitioner

## 2016-12-03 ENCOUNTER — Non-Acute Institutional Stay: Payer: Medicare Other | Admitting: Nurse Practitioner

## 2016-12-03 DIAGNOSIS — I482 Chronic atrial fibrillation, unspecified: Secondary | ICD-10-CM

## 2016-12-03 DIAGNOSIS — R29898 Other symptoms and signs involving the musculoskeletal system: Secondary | ICD-10-CM | POA: Diagnosis not present

## 2016-12-03 DIAGNOSIS — S2242XA Multiple fractures of ribs, left side, initial encounter for closed fracture: Secondary | ICD-10-CM

## 2016-12-03 DIAGNOSIS — N3000 Acute cystitis without hematuria: Secondary | ICD-10-CM | POA: Diagnosis not present

## 2016-12-03 DIAGNOSIS — Z9181 History of falling: Secondary | ICD-10-CM | POA: Diagnosis not present

## 2016-12-03 DIAGNOSIS — F0390 Unspecified dementia without behavioral disturbance: Secondary | ICD-10-CM

## 2016-12-03 DIAGNOSIS — I10 Essential (primary) hypertension: Secondary | ICD-10-CM

## 2016-12-03 DIAGNOSIS — E039 Hypothyroidism, unspecified: Secondary | ICD-10-CM

## 2016-12-03 DIAGNOSIS — R2689 Other abnormalities of gait and mobility: Secondary | ICD-10-CM | POA: Diagnosis not present

## 2016-12-03 DIAGNOSIS — R41841 Cognitive communication deficit: Secondary | ICD-10-CM | POA: Diagnosis not present

## 2016-12-03 DIAGNOSIS — S2242XS Multiple fractures of ribs, left side, sequela: Secondary | ICD-10-CM | POA: Diagnosis not present

## 2016-12-03 DIAGNOSIS — M6281 Muscle weakness (generalized): Secondary | ICD-10-CM | POA: Diagnosis not present

## 2016-12-03 NOTE — Assessment & Plan Note (Signed)
Fully treated 

## 2016-12-03 NOTE — Assessment & Plan Note (Signed)
10/29/16 TSH 1.58, continue Aricept, 11/11/16 MMSE 20/30

## 2016-12-03 NOTE — Progress Notes (Signed)
Provider:  Marlana Latus NP  Location:   Clinic [place] of Service:  Clinic ([id number])  PCP: Jeanmarie Hubert, MD Patient Care Team: Estill Dooms, MD as PCP - General (Internal Medicine) Zelta Enfield Otho Darner, NP as Nurse Practitioner (Internal Medicine) [name], MD as Attending Physician (Neurology)  Extended Emergency Contact Information Primary Emergency Contact: Key,Caroline Address: [place] Phone: [phone] Mobile Phone: [phone] Relation: Daughter Secondary Emergency Contact: Key,Tim  [place] Mobile Phone: [phone] Relation: Son  Code Status: DNR Goals of Care: Advanced Directive information Advanced Directives 12/03/2016  Does Patient Have a Medical Advance Directive? Yes  Type of Paramedic of [place];Living will;Out of facility DNR (pink MOST or yellow form)  Does patient want to make changes to medical advance directive? No - Patient declined  Copy of [place] in Chart? Yes  Would patient like information on creating a medical advance directive? -  Pre-existing out of facility DNR order (yellow form or pink MOST form) Yellow form placed in chart (order not valid for inpatient use)      Chief Complaint  Patient presents with  . Medical Management of Chronic Issues    HPI: Patient is a 83 y.o. female seen today for evaluation of chronic medical conditions.  Hx of HTN controlled Norvasc 2.5mg , Carvedilol 6.25mg  bid,  Maxzide 37.5-25mg , 1/2 tab daily. Afib, heart rate is in control, taking Norvasc 2.5mg  daily, Carvedilol 6.25mg  bid, Xarelto 15mg  daily. Dementia, taking Aricept, [place] in [place] for care [place]. Sustained multiple the left rib fxs from the fall 11/15/16, healing nicely, prn Norco for pain.   Past Medical History:  Diagnosis Date  . Adenomatous colon polyp 07/17/97  . Allergic rhinitis   . Allergy   . Anxiety disorder   .  Atrial fibrillation ([place])   . Chronic anticoagulation   . Diverticulosis   . History of shingles   . Hypercholesteremia   . Hypertension   . Hypothyroidism   . Internal hemorrhoids   . Melanoma ([place])   . Memory loss   . Meniere disease   . Tension headache    Past Surgical History:  Procedure Laterality Date  . CATARACT EXTRACTION Right 2012    reports that she has never smoked. She has never used smokeless tobacco. She reports that she does not drink alcohol or use drugs. Social History   Social History  . Marital status: Single    Spouse name: N/A  . Number of children: 2  . Years of education: 16yr colg   Occupational History  . Retired Scientist, research (medical)    Social History Main Topics  . Smoking status: Never Smoker  . Smokeless tobacco: Never Used  . Alcohol use No  . Drug use: No  . Sexual activity: No   Other Topics Concern  . Not on file   Social History Narrative   1 cup of coffee a day, occasionally drinks tea or soda   Social History     Marital status: Widowed             Spouse name:                        Years of education:        Number of children: 2  Occupational History   Occupation : Surveyor, mining            Comment                Retired                                       Social History Main Topics     Smoking status: Never Smoker                                                                  Smokeless tobacco: Never Used                         Alcohol use: No               Drug use: No               Sexual activity: Not on file            Other Topics            Concern     None on file      Social History Narrative     1 cup of coffee a day, occasionally drinks tea or soda.   She does not exercise, has a living will, DNR and POA   Lives at [facility] Status Survey:    Family History  Problem Relation Age of Onset  . Stroke Father   . Clotting disorder Father   .  Colon cancer Mother   . Colon cancer Sister   . Clotting disorder Sister     Health Maintenance  Topic Date Due  . Samul Dada  04/28/1944  . DEXA SCAN  04/28/1992  . PNA vac Low Risk Adult (1 of 2 - PCV13) 04/28/1992  . INFLUENZA VACCINE  04/28/2016    Allergies  Allergen Reactions  . Amitriptyline     Urinary Retention  . Amlodipine Swelling  . Erythromycin Hives  . Nitrofurantoin Nausea Only  . Sulfa Antibiotics     "really ill"  . Tavist [Clemastine]     Urinary hesitancy  . Trimethoprim     Cramps  . Verapamil     Constipation    Allergies as of 12/03/2016      Reactions   Amitriptyline    Urinary Retention   Amlodipine Swelling   Erythromycin Hives   Nitrofurantoin Nausea Only   Sulfa Antibiotics    "really ill"   Tavist [clemastine]    Urinary hesitancy   Trimethoprim    Cramps   Verapamil    Constipation      Medication List       Accurate as of 12/03/16  4:16 PM. Always use your most recent med list.          acetaminophen 325 MG tablet Commonly known as:  TYLENOL Take 650 mg by mouth every 6 (six) hours as needed for headache.   amLODipine 2.5 MG tablet Commonly known as:  NORVASC Take 2.5  mg by mouth daily.   CALCIUM-D PO Take 1 tablet by mouth daily.   carvedilol 6.25 MG tablet Commonly known as:  COREG Take 1 tablet (6.25 mg total) by mouth 2 (two) times daily with a meal.   cefUROXime 250 MG tablet Commonly known as:  CEFTIN Take 1 tablet (250 mg total) by mouth 2 (two) times daily with a meal.   donepezil 10 MG tablet Commonly known as:  ARICEPT Take 10 mg by mouth at bedtime.   HYDROcodone-acetaminophen 5-325 MG tablet Commonly known as:  NORCO/VICODIN Take by mouth. Take 1/2 tablet twice a day for pain   levothyroxine 50 MCG tablet Commonly known as:  SYNTHROID, LEVOTHROID Take 50 mcg by mouth See admin instructions. Only on Tuesday, Thursday, Saturday, Sunday.   levothyroxine 75 MCG tablet Commonly known as:   SYNTHROID, LEVOTHROID Take 75 mcg by mouth. Take one tablet Monday and Friday   phenazopyridine 100 MG tablet Commonly known as:  PYRIDIUM Take 1 tablet (100 mg total) by mouth 3 (three) times daily with meals.   triamterene-hydrochlorothiazide 37.5-25 MG tablet Commonly known as:  MAXZIDE-25 Take 0.5 tablets by mouth daily.       Review of Systems  Constitutional: Negative for activity change, appetite change, chills, diaphoresis, fatigue and fever.  HENT: Positive for hearing loss. Negative for congestion, dental problem, drooling, ear discharge, ear pain, facial swelling, mouth sores, nosebleeds, postnasal drip, rhinorrhea, sinus pain, sinus pressure, sneezing, sore throat, tinnitus, trouble swallowing and voice change.        HOH, hearing aid left  Eyes: Negative for photophobia, pain, discharge, redness, itching and visual disturbance.  Respiratory: Negative for apnea, cough, choking, chest tightness, shortness of breath, wheezing and stridor.   Cardiovascular: Negative for chest pain, palpitations and leg swelling.  Gastrointestinal: Negative for abdominal distention, abdominal pain, anal bleeding, blood in stool, constipation, diarrhea, nausea and vomiting.  Endocrine: Negative for cold intolerance, heat intolerance, polydipsia, polyphagia and polyuria.  Genitourinary: Negative for difficulty urinating, dysuria, flank pain, frequency, pelvic pain and urgency.  Musculoskeletal: Positive for gait problem. Negative for arthralgias, back pain, joint swelling, myalgias and neck pain.  Skin: Negative for color change, pallor, rash and wound.  Allergic/Immunologic: Negative for environmental allergies, food allergies and immunocompromised state.  Neurological: Negative for dizziness, tremors, seizures, syncope, facial asymmetry, speech difficulty, weakness, light-headedness, numbness and headaches.  Hematological: Negative for adenopathy. Bruises/bleeds easily.  Psychiatric/Behavioral:  Positive for confusion and decreased concentration. Negative for agitation, dysphoric mood, hallucinations, sleep disturbance and suicidal ideas. The patient is not nervous/anxious and is not hyperactive.     Vitals:   12/03/16 1527  BP: (!) 180/100  Pulse: 84  Temp: 97.4 F (36.3 C)  SpO2: 96%  Weight: 129 lb 6.4 oz (58.7 kg)  Height: 5\' 5"  (1.651 m)   Body mass index is 21.53 kg/m. Physical Exam  Constitutional: She appears well-developed and well-nourished. No distress.  HENT:  Head: Normocephalic and atraumatic.  Right Ear: External ear normal.  Left Ear: External ear normal.  Mouth/Throat: Oropharynx is clear and moist.  Eyes: EOM are normal. Pupils are equal, round, and reactive to light. Right eye exhibits no discharge. Left eye exhibits no discharge. No scleral icterus.  Neck: Normal range of motion. Neck supple. No JVD present. No tracheal deviation present. No thyromegaly present.  Cardiovascular: Normal rate, regular rhythm and intact distal pulses.  Exam reveals no gallop and no friction rub.   No murmur heard. Afib, heart rate is in control.  Pulmonary/Chest: Effort normal and breath sounds normal. No stridor. No respiratory distress. She has no wheezes. She has no rales.  Abdominal: Soft. Bowel sounds are normal. She exhibits no distension. There is no tenderness. There is no rebound and no guarding.  Musculoskeletal: Normal range of motion. She exhibits no edema, tenderness or deformity.  Neurological: She is alert. She has normal reflexes. A cranial nerve deficit is present. She exhibits normal muscle tone. Coordination normal.  Skin: Skin is warm and dry. She is not diaphoretic.  Psychiatric:  Dementia, confusion, irritated when unable to remember things.     Labs reviewed: Basic Metabolic Panel:  Recent Labs  11/15/16 1335 11/16/16 0512 11/17/16 0550  NA 135 138 136  K 3.5 3.5 3.1*  CL 97* 103 100*  CO2 28 26 27   GLUCOSE 102* 93 108*  BUN 18 18 18     CREATININE 0.89 0.84 0.80  CALCIUM 9.4 9.2 9.3   Liver Function Tests:  Recent Labs  11/10/16 11/15/16 1335  AST 17 23  ALT 13 18  ALKPHOS 79 71  BILITOT  --  0.9  PROT  --  6.7  ALBUMIN  --  4.1    Recent Labs  11/15/16 1335  LIPASE 33   No results for input(s): AMMONIA in the last 8760 hours. CBC:  Recent Labs  06/01/16 0950  11/15/16 1335 11/16/16 0512 11/17/16 0550  WBC 7.0  < > 9.9 11.0* 10.7*  NEUTROABS 5.2  --  7.3  --   --   HGB 12.0  < > 12.2 12.0 11.4*  HCT 37.3  < > 37.3 37.3 35.2*  MCV 93.5  < > 91.2 92.3 91.2  PLT 209  < > 265 260 252  < > = values in this interval not displayed. Cardiac Enzymes:  Recent Labs  06/02/16 0150 11/15/16 1616 11/15/16 2132  TROPONINI <0.03 <0.03 <0.03   BNP: Invalid input(s): POCBNP Lab Results  Component Value Date   HGBA1C 5.7 11/10/2016   Lab Results  Component Value Date   TSH 4.412 06/01/2016   No results found for: VITAMINB12 No results found for: FOLATE No results found for: IRON, TIBC, FERRITIN  Imaging and Procedures obtained prior to SNF admission: Dg Chest Portable 1 View  Result Date: 06/01/2016 CLINICAL DATA:  Chest pain and shortness of breath this morning with lightheadedness, and tibia, nausea and weakness. EXAM: PORTABLE CHEST 1 VIEW COMPARISON:  None. FINDINGS: The cardiac silhouette, mediastinal and hilar contours are within normal limits for age. There is mild tortuosity and calcification of the thoracic aorta. The lungs are clear. No pleural effusion. No pulmonary lesions. The bony thorax is intact. IMPRESSION: No acute cardiopulmonary findings. Electronically Signed   By: Marijo Sanes M.D.   On: 06/01/2016 10:31    Assessment/Plan  HTN (hypertension) Not controlled, Bp 180/100, continue Norvasc 2.5mg , Carvedilol 6.25mg  bid,  Maxzide 37.5-25mg , 1/2 tab daily, adding Lisinopril 10mg  daily, VS q shift. F/u BMP   Atrial fibrillation (HCC) Heart rate is in control, continue Amlodipine  2.5mg , Carvedilol 6.25mg  bid, off Xarelto 15mg  daily.   Hypothyroidism 10/29/16 TSH 1.58 Continue Levothyroxine 46mcg daily.   Dementia 10/29/16 TSH 1.58, continue Aricept, 11/11/16 MMSE 20/30  Rib fracture 11/15/16 X-ray L rib, CXR subtle left lower lobe infiltrate and pleural effusion, mildly displaced fractures involving the distal aspects of the left 6th, 7th, 8th ribs. CT chest 11/15/16 5 6 7 8 9 10  rib fxs 12/03/16 healing nicely, continue Norco prn for pain.  UTI (urinary tract infection) Fully treated.   Family/ staff Communication: [place]  Labs/tests ordered: CBC,  CMP,  Hgb a1c, lipid panel   Provider:  Marlana Latus NP  Location:   Clinic [facility]   Place of Service:  Clinic (12)Clinic  PCP: Jeanmarie Hubert, MD Patient Care Team: Estill Dooms, MD as PCP - General (Internal Medicine) Brycelyn Gambino Otho Darner, NP as Nurse Practitioner (Internal Medicine) [name], MD as Attending Physician (Neurology)  Extended Emergency Contact Information Primary Emergency Contact: Key,Caroline Address: [place] Phone: [phone] Mobile Phone: [phone] Relation: Daughter Secondary Emergency Contact: Key,Tim  [place] Mobile Phone: [phone] Relation: Son  Code Status: DNR Goals of Care: Advanced Directive information Advanced Directives 12/03/2016  Does Patient Have a Medical Advance Directive? Yes  Type of Paramedic of [place];Living will;Out of facility DNR (pink MOST or yellow form)  Does patient want to make changes to medical advance directive? No - Patient declined  Copy of [place] in Chart? Yes  Would patient like information on creating a medical advance directive? -  Pre-existing out of facility DNR order (yellow form or pink MOST form) Yellow form placed in chart (order not valid for inpatient use)      Chief Complaint  Patient presents with  . Medical  Management of Chronic Issues    HPI: Patient is a 83 y.o. female seen today for admission to [facility] clinic. Hx of HTN controlled Norvasc 2.5mg , Carvedilol 6.25mg  bid,  Maxzide 37.5-25mg , 1/2 tab daily. Afib, heart rate is in control, taking Norvasc 2.5mg  daily, Carvedilol 6.25mg  bid, Xarelto 15mg  daily. Dementia, taking Aricept, recently admitted to [facility] for care assistance.   Past Medical History:  Diagnosis Date  . Adenomatous colon polyp 07/17/97  . Allergic rhinitis   . Allergy   . Anxiety disorder   . Atrial fibrillation ([place])   . Chronic anticoagulation   . Diverticulosis   . History of shingles   . Hypercholesteremia   . Hypertension   . Hypothyroidism   . Internal hemorrhoids   . Melanoma ([place])   . Memory loss   . Meniere disease   . Tension headache    Past Surgical History:  Procedure Laterality Date  . CATARACT EXTRACTION Right 2012    reports that she has never smoked. She has never used smokeless tobacco. She reports that she does not drink alcohol or use drugs. Social History   Social History  . Marital status: Single    Spouse name: N/A  . Number of children: 2  . Years of education: 68yr colg   Occupational History  . Retired Scientist, research (medical)    Social History Main Topics  . Smoking status: Never Smoker  . Smokeless tobacco: Never Used  . Alcohol use No  . Drug use: No  . Sexual activity: No   Other Topics Concern  . Not on file   Social History Narrative   1 cup of coffee a day, occasionally drinks tea or soda   Social History     Marital status: Widowed             Spouse name:                        Years of education:        Number of children: 2  Occupational History   Occupation : Surveyor, mining            Comment                Retired                                       Social History Main Topics     Smoking status: Never Smoker                                                                  Smokeless tobacco: Never  Used                         Alcohol use: No               Drug use: No               Sexual activity: Not on file            Other Topics            Concern     None on file      Social History Narrative     1 cup of coffee a day, occasionally drinks tea or soda.   She does not exercise, has a living will, DNR and POA   Lives at [facility] Status Survey:    Family History  Problem Relation Age of Onset  . Stroke Father   . Clotting disorder Father   . Colon cancer Mother   . Colon cancer Sister   . Clotting disorder Sister     Health Maintenance  Topic Date Due  . Samul Dada  04/28/1944  . DEXA SCAN  04/28/1992  . PNA vac Low Risk Adult (1 of 2 - PCV13) 04/28/1992  . INFLUENZA VACCINE  04/28/2016    Allergies  Allergen Reactions  . Amitriptyline     Urinary Retention  . Amlodipine Swelling  . Erythromycin Hives  . Nitrofurantoin Nausea Only  . Sulfa Antibiotics     "really ill"  . Tavist [Clemastine]     Urinary hesitancy  . Trimethoprim     Cramps  . Verapamil     Constipation    Allergies as of 12/03/2016      Reactions   Amitriptyline    Urinary Retention   Amlodipine Swelling   Erythromycin Hives   Nitrofurantoin Nausea Only   Sulfa Antibiotics    "really ill"   Tavist [clemastine]    Urinary hesitancy   Trimethoprim    Cramps   Verapamil    Constipation      Medication List       Accurate as of 12/03/16  4:16 PM. Always use your most recent med list.          acetaminophen 325 MG tablet Commonly known as:  TYLENOL Take 650 mg by mouth every 6 (six) hours as needed for headache.   amLODipine 2.5 MG tablet Commonly known as:  NORVASC Take 2.5  mg by mouth daily.   CALCIUM-D PO Take 1 tablet by mouth daily.   carvedilol 6.25 MG tablet Commonly known as:  COREG Take 1 tablet (6.25 mg total) by mouth 2 (two) times daily with a meal.   cefUROXime 250 MG tablet Commonly known as:   CEFTIN Take 1 tablet (250 mg total) by mouth 2 (two) times daily with a meal.   donepezil 10 MG tablet Commonly known as:  ARICEPT Take 10 mg by mouth at bedtime.   HYDROcodone-acetaminophen 5-325 MG tablet Commonly known as:  NORCO/VICODIN Take by mouth. Take 1/2 tablet twice a day for pain   levothyroxine 50 MCG tablet Commonly known as:  SYNTHROID, LEVOTHROID Take 50 mcg by mouth See admin instructions. Only on Tuesday, Thursday, Saturday, Sunday.   levothyroxine 75 MCG tablet Commonly known as:  SYNTHROID, LEVOTHROID Take 75 mcg by mouth. Take one tablet Monday and Friday   phenazopyridine 100 MG tablet Commonly known as:  PYRIDIUM Take 1 tablet (100 mg total) by mouth 3 (three) times daily with meals.   triamterene-hydrochlorothiazide 37.5-25 MG tablet Commonly known as:  MAXZIDE-25 Take 0.5 tablets by mouth daily.       Review of Systems  Constitutional: Negative for activity change, appetite change, chills, diaphoresis, fatigue and fever.  HENT: Positive for hearing loss. Negative for congestion, dental problem, drooling, ear discharge, ear pain, facial swelling, mouth sores, nosebleeds, postnasal drip, rhinorrhea, sinus pain, sinus pressure, sneezing, sore throat, tinnitus, trouble swallowing and voice change.        HOH, hearing aid left  Eyes: Negative for photophobia, pain, discharge, redness, itching and visual disturbance.  Respiratory: Negative for apnea, cough, choking, chest tightness, shortness of breath, wheezing and stridor.   Cardiovascular: Negative for chest pain, palpitations and leg swelling.  Gastrointestinal: Negative for abdominal distention, abdominal pain, anal bleeding, blood in stool, constipation, diarrhea, nausea and vomiting.  Endocrine: Negative for cold intolerance, heat intolerance, polydipsia, polyphagia and polyuria.  Genitourinary: Negative for difficulty urinating, dysuria, flank pain, frequency, pelvic pain and urgency.   Musculoskeletal: Positive for gait problem. Negative for arthralgias, back pain, joint swelling, myalgias and neck pain.  Skin: Negative for color change, pallor, rash and wound.  Allergic/Immunologic: Negative for environmental allergies, food allergies and immunocompromised state.  Neurological: Negative for dizziness, tremors, seizures, syncope, facial asymmetry, speech difficulty, weakness, light-headedness, numbness and headaches.  Hematological: Negative for adenopathy. Bruises/bleeds easily.  Psychiatric/Behavioral: Positive for confusion and decreased concentration. Negative for agitation, dysphoric mood, hallucinations, sleep disturbance and suicidal ideas. The patient is not nervous/anxious and is not hyperactive.     Vitals:   12/03/16 1527  BP: (!) 180/100  Pulse: 84  Temp: 97.4 F (36.3 C)  SpO2: 96%  Weight: 129 lb 6.4 oz (58.7 kg)  Height: 5\' 5"  (1.651 m)   Body mass index is 21.53 kg/m. Physical Exam  Constitutional: She appears well-developed and well-nourished. No distress.  HENT:  Head: Normocephalic and atraumatic.  Right Ear: External ear normal.  Left Ear: External ear normal.  Mouth/Throat: Oropharynx is clear and moist.  Eyes: EOM are normal. Pupils are equal, round, and reactive to light. Right eye exhibits no discharge. Left eye exhibits no discharge. No scleral icterus.  Neck: Normal range of motion. Neck supple. No JVD present. No tracheal deviation present. No thyromegaly present.  Cardiovascular: Normal rate, regular rhythm and intact distal pulses.  Exam reveals no gallop and no friction rub.   No murmur heard. Afib, heart rate is in control.  Pulmonary/Chest: Effort normal and breath sounds normal. No stridor. No respiratory distress. She has no wheezes. She has no rales.  Abdominal: Soft. Bowel sounds are normal. She exhibits no distension. There is no tenderness. There is no rebound and no guarding.  Musculoskeletal: Normal range of motion. She  exhibits no edema, tenderness or deformity.  Neurological: She is alert. She has normal reflexes. She displays normal reflexes. A cranial nerve deficit is present. She exhibits normal muscle tone. Coordination normal.  Skin: Skin is warm and dry. She is not diaphoretic.  Psychiatric:  Dementia, confusion, irritated when unable to remember things.     Labs reviewed: Basic Metabolic Panel:  Recent Labs  11/15/16 1335 11/16/16 0512 11/17/16 0550  NA 135 138 136  K 3.5 3.5 3.1*  CL 97* 103 100*  CO2 28 26 27   GLUCOSE 102* 93 108*  BUN 18 18 18   CREATININE 0.89 0.84 0.80  CALCIUM 9.4 9.2 9.3   Liver Function Tests:  Recent Labs  11/10/16 11/15/16 1335  AST 17 23  ALT 13 18  ALKPHOS 79 71  BILITOT  --  0.9  PROT  --  6.7  ALBUMIN  --  4.1    Recent Labs  11/15/16 1335  LIPASE 33   No results for input(s): AMMONIA in the last 8760 hours. CBC:  Recent Labs  06/01/16 0950  11/15/16 1335 11/16/16 0512 11/17/16 0550  WBC 7.0  < > 9.9 11.0* 10.7*  NEUTROABS 5.2  --  7.3  --   --   HGB 12.0  < > 12.2 12.0 11.4*  HCT 37.3  < > 37.3 37.3 35.2*  MCV 93.5  < > 91.2 92.3 91.2  PLT 209  < > 265 260 252  < > = values in this interval not displayed. Cardiac Enzymes:  Recent Labs  06/02/16 0150 11/15/16 1616 11/15/16 2132  TROPONINI <0.03 <0.03 <0.03   BNP: Invalid input(s): POCBNP Lab Results  Component Value Date   HGBA1C 5.7 11/10/2016   Lab Results  Component Value Date   TSH 4.412 06/01/2016   No results found for: VITAMINB12 No results found for: FOLATE No results found for: IRON, TIBC, FERRITIN  Imaging and Procedures obtained prior to SNF admission: Dg Chest Portable 1 View  Result Date: 06/01/2016 CLINICAL DATA:  Chest pain and shortness of breath this morning with lightheadedness, and tibia, nausea and weakness. EXAM: PORTABLE CHEST 1 VIEW COMPARISON:  None. FINDINGS: The cardiac silhouette, mediastinal and hilar contours are within normal limits  for age. There is mild tortuosity and calcification of the thoracic aorta. The lungs are clear. No pleural effusion. No pulmonary lesions. The bony thorax is intact. IMPRESSION: No acute cardiopulmonary findings. Electronically Signed   By: Marijo Sanes M.D.   On: 06/01/2016 10:31    Assessment/Plan  HTN (hypertension) Not controlled, Bp 180/100, continue Norvasc 2.5mg , Carvedilol 6.25mg  bid,  Maxzide 37.5-25mg , 1/2 tab daily, adding Lisinopril 10mg  daily, VS q shift. F/u BMP   Atrial fibrillation (HCC) Heart rate is in control, continue Amlodipine 2.5mg , Carvedilol 6.25mg  bid, off Xarelto 15mg  daily.   Hypothyroidism 10/29/16 TSH 1.58 Continue Levothyroxine 19mcg daily.   Dementia 10/29/16 TSH 1.58, continue Aricept, 11/11/16 MMSE 20/30  Rib fracture 11/15/16 X-ray L rib, CXR subtle left lower lobe infiltrate and pleural effusion, mildly displaced fractures involving the distal aspects of the left 6th, 7th, 8th ribs. CT chest 11/15/16 5 6 7 8 9 10  rib fxs 12/03/16 healing nicely, continue Norco prn for  pain.   UTI (urinary tract infection) Fully treated.   Family/ staff Communication: [place]  Labs/tests ordered: BMP

## 2016-12-03 NOTE — Assessment & Plan Note (Signed)
Not controlled, Bp 180/100, continue Norvasc 2.5mg , Carvedilol 6.25mg  bid,  Maxzide 37.5-25mg , 1/2 tab daily, adding Lisinopril 10mg  daily, VS q shift. F/u BMP

## 2016-12-03 NOTE — Assessment & Plan Note (Signed)
10/29/16 TSH 1.58 Continue Levothyroxine 74mcg daily.

## 2016-12-03 NOTE — Assessment & Plan Note (Addendum)
Heart rate is in control, continue Amlodipine 2.5mg , Carvedilol 6.25mg  bid, off Xarelto 15mg  daily.

## 2016-12-03 NOTE — Assessment & Plan Note (Addendum)
11/15/16 X-ray L rib, CXR subtle left lower lobe infiltrate and pleural effusion, mildly displaced fractures involving the distal aspects of the left 6th, 7th, 8th ribs. CT chest 11/15/16 5 6 7 8 9 10  rib fxs 12/03/16 healing nicely, continue Norco prn for pain.

## 2016-12-07 DIAGNOSIS — R2689 Other abnormalities of gait and mobility: Secondary | ICD-10-CM | POA: Diagnosis not present

## 2016-12-07 DIAGNOSIS — R29898 Other symptoms and signs involving the musculoskeletal system: Secondary | ICD-10-CM | POA: Diagnosis not present

## 2016-12-07 DIAGNOSIS — Z9181 History of falling: Secondary | ICD-10-CM | POA: Diagnosis not present

## 2016-12-07 DIAGNOSIS — M6281 Muscle weakness (generalized): Secondary | ICD-10-CM | POA: Diagnosis not present

## 2016-12-07 DIAGNOSIS — S2242XS Multiple fractures of ribs, left side, sequela: Secondary | ICD-10-CM | POA: Diagnosis not present

## 2016-12-07 DIAGNOSIS — R41841 Cognitive communication deficit: Secondary | ICD-10-CM | POA: Diagnosis not present

## 2016-12-08 ENCOUNTER — Ambulatory Visit (INDEPENDENT_AMBULATORY_CARE_PROVIDER_SITE_OTHER): Payer: Medicare Other | Admitting: Adult Health

## 2016-12-08 ENCOUNTER — Encounter: Payer: Self-pay | Admitting: Adult Health

## 2016-12-08 VITALS — BP 130/73 | HR 68 | Ht 65.0 in | Wt 129.4 lb

## 2016-12-08 DIAGNOSIS — M6281 Muscle weakness (generalized): Secondary | ICD-10-CM | POA: Diagnosis not present

## 2016-12-08 DIAGNOSIS — I10 Essential (primary) hypertension: Secondary | ICD-10-CM | POA: Diagnosis not present

## 2016-12-08 DIAGNOSIS — R29898 Other symptoms and signs involving the musculoskeletal system: Secondary | ICD-10-CM | POA: Diagnosis not present

## 2016-12-08 DIAGNOSIS — R41841 Cognitive communication deficit: Secondary | ICD-10-CM | POA: Diagnosis not present

## 2016-12-08 DIAGNOSIS — Z9181 History of falling: Secondary | ICD-10-CM | POA: Diagnosis not present

## 2016-12-08 DIAGNOSIS — R2689 Other abnormalities of gait and mobility: Secondary | ICD-10-CM | POA: Diagnosis not present

## 2016-12-08 DIAGNOSIS — R413 Other amnesia: Secondary | ICD-10-CM | POA: Diagnosis not present

## 2016-12-08 DIAGNOSIS — S2242XS Multiple fractures of ribs, left side, sequela: Secondary | ICD-10-CM | POA: Diagnosis not present

## 2016-12-08 LAB — BASIC METABOLIC PANEL
BUN: 27 mg/dL — AB (ref 4–21)
Glucose: 100 mg/dL
Potassium: 4.5 mmol/L (ref 3.4–5.3)
Sodium: 136 mmol/L — AB (ref 137–147)

## 2016-12-08 NOTE — Patient Instructions (Signed)
Continue Aricept If memory score continues to decline may need to consider Namenda If your symptoms worsen or you develop new symptoms please let us know.

## 2016-12-08 NOTE — Progress Notes (Addendum)
PATIENT: Jamie Thompson DOB: Jan 31, 1925  REASON FOR VISIT: follow up- memory disturbance HISTORY FROM: patient  HISTORY OF PRESENT ILLNESS: Ms Jamie Thompson is an 83 year old female with a history of memory disturbance. She returns today for follow-up. She is here with her son-in-law. He reports that her memory has declined. He states in the last 3 months she's had a lot of personal changes. In December she switched from independent living to assisted living. She had a fall approximately 3 weeks ago and broke 5 ribs. She was in the hospital for several days. She continues on Aricept. She does require supervision with ADLs. The patient states that she reads the newspaper daily. She reports that she takes note of the date but does not always remember it.Son in law reports increase in agitation he states that he is able to distract her with a different conversation and she calms down. She returns today for an evaluation.  HISTORY per Dr. Guadelupe Sabin notes:[name] is an 83 year old right-handed woman with an underlying medical history of A. fib, hypertension, hyperlipidemia, Mnire's disease with bilateral hearing loss, anxiety, hypothyroidism, diverticulosis, status post right cataract extraction, allergic rhinitis, and vitamin D deficiency, who presents for follow-up consultation of her memory loss. The patient is accompanied by her son-in-law today. I last saw her on 01/28/2016, at which time her son-in-law noted more confusion, gradually with progression. I initiated Aricept low-dose generic. She was then seen in the interim by our nurse practitioner, Ms. Clabe Seal on 04/07/2016, at which time her MMSE was 27 and donepezil was increased to 10 mg once daily. I reviewed the neuropsychological test results from 01/27/2016, when she saw Dr. Valentina Shaggy:  Diagnostic Impression Mild Cognitive impairment, amnestic type, multiple domains [G31.84]  Recommendations 1.     Assuming continued cognitive decline, it may be  necessary for her to move into assisted living within the coming year. Family should have regular contact with staff at her independent living center to help make this decision.   2.     Given her memory loss, she requires oversight of medication administration and bill paying. Her son-in-law stated that he is available to do this on a daily basis.  3.         It is recommended that she undergo a repeat neuropsychological evaluation in about one year using these test results as a baseline to track for interval changes, if any, in her cognitive functioning.   07/30/2016: She reports doing okay. Per son in law she is not very motivated to exercise. Appetite is good, weight stable. She was in the hospital recently from 06/01/2016 through 06/03/2016 secondary to shortness of breath, chest pain, and vomiting reported. She had cardiac workup which was negative for any acute cardiac event, she was diagnosed with UTI.  Previously:   I saw her on 10/30/2015 at which time she reported for a sooner than scheduled appointment because of worsening memory issues noted by her other daughter who had recently visited the patient. She had more forgetfulness, lumbar mood irritability, does not always drinking enough water. She was living in independent living but there have been some instances where the staff had to redirect her to her own apartment. She had a near fall or controlled fall rather about 3 weeks prior. She had seen her primary care physician and had blood work which per son-in-law was fine including urinalysis. Her MMSE was 29/30, CDT: 4/4, AFT: 12/min. I suggested we proceed with formal neuropsychological testing. She had this evaluation  with Dr. Valentina Shaggy on 01/27/2016.   She presents for a sooner than scheduled appointment because of worsening memory loss. I last saw her on 07/01/2015, at which time she reported doing well. Her son-in-law had observed her driving and was satisfied with her driving. She  was mildly forgetful, she had not fallen. She had a good appetite. Her MMSE was 27 out of 30 at the time, clock drawing was 3 out of 4, animal fluency 10/m. We mutually agreed to monitor her symptoms and not start her on medication for memory loss at the time.  I first met her on 02/26/2015 at the request of her primary care physician, at which time she reported a several month history of short-term memory loss. Her MMSE was 24 out of 30, clock drawing was 3 out of 4, animal fluency was 9/m, depression score was 0 out of 15 at the time of her first visit with me. She had had a prior brain MRI several years ago and I suggested we proceed with another MRI to compare findings. I did not suggest any new medications and she recently had had blood work through her primary care physician. We talked about her driving and I asked her daughter to observe her driving. I suggested the patient stay on local roads, avoid highways and Interstates and avoid driving at night. She had a brain MRI without contrast on 03/09/2015: Ordinary age related volume loss. Minimal small vessel change of the cerebral hemispheric white matter, less than often seen in healthy individuals of this age. No acute or reversible finding. No specific cause of the presenting symptoms is identified. Since the previous study, atrophy is slightly progressive as would be expected.  Findings were compared to a previous brain MRI from 03/02/2005. In addition, I personally reviewed the images through the PACS system. We called her with her test results  02/26/2015: She reports short-term memory issues for the past few months. She reported some forgetfulness, misplacing things, and forgetting conversations. She still drives. She drives locally for the most part. She has not had any issues driving but her daughter has not recently observed her driving. Per daughter, she has had a 6-9 month history of forgetfulness, misplacing things, forgetting events or  conversations. Familiar faces are easily recognized. She has no evidence of paranoia, no other delusions, no delusions, no hallucinations, and no personality changes or behavioral changes. She lives at friend's home in independent living. She has 2 daughters, [name] his local and her other daughter, [name], is in [place], [place]. She does not have a strong family history of dementia but a maternal aunt had Alzheimer's disease. Her mother lived to be 79 years old, her father died at the age of 59. She is a lifelong nonsmoker and does not currently drink any alcohol. She has never had any heavy alcohol use. She does not use illicit drugs. She sleeps fairly well. She has not had any confusion or disorientation. She tries to walk regularly. She goes to the dining room once a day for lunch. She eats fairly well. I reviewed your office note from 02/12/2015 which you kindly included. Recent blood work from 02/06/2015 showed normal liver function, cholesterol of 148, triglycerides of 57, LDL of 84. She had a brain MRI with and without contrast on 03/02/2005: 1. No internal auditory canal enhancing lesion or acute infarct. 2. Mild age related atrophy. Mild nonspecific white matter changes may be related to sequela of small vessel disease. 3. Cervical spondylotic changes with spinal  stenosis C3-4 and C4-5 incompletely evaluated on the present exam.  In addition, I personally reviewed the images through the PACS system.   REVIEW OF SYSTEMS: Out of a complete 14 system review of symptoms, the patient complains only of the following symptoms, and all other reviewed systems are negative.  Memory loss, agitation  ALLERGIES: Allergies  Allergen Reactions  . Amitriptyline     Urinary Retention  . Amlodipine Swelling  . Erythromycin Hives  . Nitrofurantoin Nausea Only  . Sulfa Antibiotics     "really ill"  . Tavist [Clemastine]     Urinary hesitancy  . Trimethoprim     Cramps  . Verapamil      Constipation    HOME MEDICATIONS: Outpatient Medications Prior to Visit  Medication Sig Dispense Refill  . acetaminophen (TYLENOL) 325 MG tablet Take 650 mg by mouth every 6 (six) hours as needed for headache.    Marland Kitchen amLODipine (NORVASC) 2.5 MG tablet Take 2.5 mg by mouth daily.     . Calcium Carbonate-Vitamin D (CALCIUM-D PO) Take 1 tablet by mouth daily.    . carvedilol (COREG) 6.25 MG tablet Take 1 tablet (6.25 mg total) by mouth 2 (two) times daily with a meal. 62 tablet 3  . cefUROXime (CEFTIN) 250 MG tablet Take 1 tablet (250 mg total) by mouth 2 (two) times daily with a meal. 6 tablet 0  . donepezil (ARICEPT) 10 MG tablet Take 10 mg by mouth at bedtime.     Marland Kitchen HYDROcodone-acetaminophen (NORCO/VICODIN) 5-325 MG tablet Take by mouth. Take 1/2 tablet twice a day for pain    . levothyroxine (SYNTHROID, LEVOTHROID) 50 MCG tablet Take 50 mcg by mouth See admin instructions. Only on Tuesday, Thursday, Saturday, Sunday.    . levothyroxine (SYNTHROID, LEVOTHROID) 75 MCG tablet Take 75 mcg by mouth. Take one tablet Monday and Friday    . phenazopyridine (PYRIDIUM) 100 MG tablet Take 1 tablet (100 mg total) by mouth 3 (three) times daily with meals. 10 tablet 0  . triamterene-hydrochlorothiazide (MAXZIDE-25) 37.5-25 MG tablet Take 0.5 tablets by mouth daily.      No facility-administered medications prior to visit.     PAST MEDICAL HISTORY: Past Medical History:  Diagnosis Date  . Adenomatous colon polyp 07/17/97  . Allergic rhinitis   . Allergy   . Anxiety disorder   . Atrial fibrillation ([place])   . Chronic anticoagulation   . Diverticulosis   . History of shingles   . Hypercholesteremia   . Hypertension   . Hypothyroidism   . Internal hemorrhoids   . Melanoma ([place])   . Memory loss   . Meniere disease   . Tension headache     PAST SURGICAL HISTORY: Past Surgical History:  Procedure Laterality Date  . CATARACT EXTRACTION Right 2012    FAMILY HISTORY: Family History  Problem  Relation Age of Onset  . Stroke Father   . Clotting disorder Father   . Colon cancer Mother   . Colon cancer Sister   . Clotting disorder Sister     SOCIAL HISTORY: Social History   Social History  . Marital status: Single    Spouse name: N/A  . Number of children: 2  . Years of education: 67yrcolg   Occupational History  . Retired rScientist, research (medical)   Social History Main Topics  . Smoking status: Never Smoker  . Smokeless tobacco: Never Used  . Alcohol use No  . Drug use: No  . Sexual activity: No  Other Topics Concern  . Not on file   Social History Narrative   1 cup of coffee a day, occasionally drinks tea or soda   Social History     Marital status: Widowed             Spouse name:                        Years of education:        Number of children: 2               Occupational History   Occupation : Surveyor, mining            Comment                Retired                                       Social History Main Topics     Smoking status: Never Smoker                                                                  Smokeless tobacco: Never Used                         Alcohol use: No               Drug use: No               Sexual activity: Not on file            Other Topics            Concern     None on file      Social History Narrative     1 cup of coffee a day, occasionally drinks tea or soda.   She does not exercise, has a living will, DNR and POA   Lives at [facility]:   12/08/16 1045  BP: 130/73  Pulse: 68  Weight: 129 lb 6.4 oz (58.7 kg)  Height: '5\' 5"'  (1.651 m)   Body mass index is 21.53 kg/m.   MMSE - Mini Mental State Exam 12/08/2016 11/11/2016 04/07/2016  Orientation to time '2 3 4  ' Orientation to Place '4 3 5  ' Registration '3 2 3  ' Attention/ Calculation '5 3 5  ' Attention/Calculation-comments - - refused numbers  Recall 0 0 2  Language- name 2 objects '2 2 2  ' Language-  repeat '1 1 1  ' Language- follow 3 step command '2 3 3  ' Language- read & follow direction '1 1 1  ' Write a sentence '1 1 1  ' Copy design 1 1 0  Total score '22 20 27     ' Generalized: Well developed, in no acute distress   Neurological examination  Mentation: Alert Follows all commands speech and language fluent Cranial nerve II-XII: Pupils were equal round reactive to light. Extraocular movements were full, visual field were full on confrontational test.  Facial sensation and strength were normal. Uvula tongue midline. Head turning and shoulder shrug  were normal and symmetric. Motor: The motor testing reveals 5 over 5 strength of all 4 extremities. Good symmetric motor tone is noted throughout.  Sensory: Sensory testing is intact to soft touch on all 4 extremities. No evidence of extinction is noted.  Coordination: Cerebellar testing reveals good finger-nose-finger and heel-to-shin bilaterally.  Gait and station: Gait is normal.  Reflexes: Deep tendon reflexes are symmetric and normal bilaterally.   DIAGNOSTIC DATA (LABS, IMAGING, TESTING) - I reviewed patient records, labs, notes, testing and imaging myself where available.  Lab Results  Component Value Date   WBC 10.7 (H) 11/17/2016   HGB 11.4 (L) 11/17/2016   HCT 35.2 (L) 11/17/2016   MCV 91.2 11/17/2016   PLT 252 11/17/2016      Component Value Date/Time   NA 136 11/17/2016 0550   NA 140 11/10/2016   K 3.1 (L) 11/17/2016 0550   CL 100 (L) 11/17/2016 0550   CO2 27 11/17/2016 0550   GLUCOSE 108 (H) 11/17/2016 0550   BUN 18 11/17/2016 0550   BUN 19 11/10/2016   CREATININE 0.80 11/17/2016 0550   CALCIUM 9.3 11/17/2016 0550   PROT 6.7 11/15/2016 1335   ALBUMIN 4.1 11/15/2016 1335   AST 23 11/15/2016 1335   ALT 18 11/15/2016 1335   ALKPHOS 71 11/15/2016 1335   BILITOT 0.9 11/15/2016 1335   GFRNONAA >60 11/17/2016 0550   GFRAA >60 11/17/2016 0550   Lab Results  Component Value Date   CHOL 200 11/10/2016   HDL 63 11/10/2016    LDLCALC 121 11/10/2016   TRIG 81 11/10/2016   Lab Results  Component Value Date   HGBA1C 5.7 11/10/2016   No results found for: VITAMINB12 Lab Results  Component Value Date   TSH 4.412 06/01/2016      ASSESSMENT AND PLAN 83 y.o. year old female  has a past medical history of Adenomatous colon polyp (07/17/97); Allergic rhinitis; Allergy; Anxiety disorder; Atrial fibrillation ([place]); Chronic anticoagulation; Diverticulosis; History of shingles; Hypercholesteremia; Hypertension; Hypothyroidism; Internal hemorrhoids; Melanoma ([place]); Memory loss; Meniere disease; and Tension headache. here with :  1. Memory disturbance  Patient's memory score has declined since July. She will remain on Aricept 10 mg at bedtime. I discussed Namenda with the patient and her son-in-law. He states that he would like to wait and continue to monitor her memory. If she continues to decline they will reconsider Namenda at that time. Patient and her son-in-law advised that if her symptoms worsen or she develops new symptoms that will let us know. She will follow-up in 6 months or sooner if needed.  I spent 25 minutes with the patient and her son-in-law. 50% of this time was spent discussing her diagnosis and medication.     Ward Givens, MSN, NP-C 12/08/2016, 11:11 AM [facility] [address], [place], [place] [address] [phone]  I reviewed the above note and documentation by the Nurse Practitioner and agree with the history, physical exam, assessment and plan as outlined above. I was immediately available for face-to-face consultation. [name], MD, PhD [facility])

## 2016-12-09 ENCOUNTER — Other Ambulatory Visit: Payer: Self-pay | Admitting: *Deleted

## 2016-12-09 DIAGNOSIS — R29898 Other symptoms and signs involving the musculoskeletal system: Secondary | ICD-10-CM | POA: Diagnosis not present

## 2016-12-09 DIAGNOSIS — M6281 Muscle weakness (generalized): Secondary | ICD-10-CM | POA: Diagnosis not present

## 2016-12-09 DIAGNOSIS — R2689 Other abnormalities of gait and mobility: Secondary | ICD-10-CM | POA: Diagnosis not present

## 2016-12-09 DIAGNOSIS — Z9181 History of falling: Secondary | ICD-10-CM | POA: Diagnosis not present

## 2016-12-09 DIAGNOSIS — R41841 Cognitive communication deficit: Secondary | ICD-10-CM | POA: Diagnosis not present

## 2016-12-09 DIAGNOSIS — S2242XS Multiple fractures of ribs, left side, sequela: Secondary | ICD-10-CM | POA: Diagnosis not present

## 2016-12-10 DIAGNOSIS — R41841 Cognitive communication deficit: Secondary | ICD-10-CM | POA: Diagnosis not present

## 2016-12-10 DIAGNOSIS — S2242XS Multiple fractures of ribs, left side, sequela: Secondary | ICD-10-CM | POA: Diagnosis not present

## 2016-12-10 DIAGNOSIS — R29898 Other symptoms and signs involving the musculoskeletal system: Secondary | ICD-10-CM | POA: Diagnosis not present

## 2016-12-10 DIAGNOSIS — Z9181 History of falling: Secondary | ICD-10-CM | POA: Diagnosis not present

## 2016-12-10 DIAGNOSIS — R2689 Other abnormalities of gait and mobility: Secondary | ICD-10-CM | POA: Diagnosis not present

## 2016-12-10 DIAGNOSIS — M6281 Muscle weakness (generalized): Secondary | ICD-10-CM | POA: Diagnosis not present

## 2016-12-14 DIAGNOSIS — R41841 Cognitive communication deficit: Secondary | ICD-10-CM | POA: Diagnosis not present

## 2016-12-14 DIAGNOSIS — M6281 Muscle weakness (generalized): Secondary | ICD-10-CM | POA: Diagnosis not present

## 2016-12-14 DIAGNOSIS — R29898 Other symptoms and signs involving the musculoskeletal system: Secondary | ICD-10-CM | POA: Diagnosis not present

## 2016-12-14 DIAGNOSIS — Z9181 History of falling: Secondary | ICD-10-CM | POA: Diagnosis not present

## 2016-12-14 DIAGNOSIS — R2689 Other abnormalities of gait and mobility: Secondary | ICD-10-CM | POA: Diagnosis not present

## 2016-12-14 DIAGNOSIS — S2242XS Multiple fractures of ribs, left side, sequela: Secondary | ICD-10-CM | POA: Diagnosis not present

## 2016-12-17 ENCOUNTER — Telehealth: Payer: Self-pay

## 2016-12-17 NOTE — Telephone Encounter (Signed)
Please advise if ok to fill Hydrcodone for this patient, we have never filled this for her before. Current medication list does not have any instructions for how medication is taken.   Instructions on fax: Take 1/2 tablet by mouth two times daily as needed.   Patient will need a hard script to submit to pharmacy

## 2016-12-18 ENCOUNTER — Telehealth: Payer: Self-pay

## 2016-12-18 NOTE — Telephone Encounter (Signed)
[facility] called and stated that they received the letter that you wrote and they noticed that the letter said patient can not make medical decisions they are request a letter and need the letter to state that patient can not make financial decisions. Number to reach them [phone] fax [phone]. Please advise.

## 2016-12-21 NOTE — Telephone Encounter (Signed)
Still awating response from Dr. Nyoka Cowden.

## 2016-12-21 NOTE — Telephone Encounter (Signed)
Called the number listed in last comment. Left message Dr. Nyoka Cowden will handle on tomorrow, Tuesday- 12/22/16...cdavis

## 2016-12-22 ENCOUNTER — Encounter: Payer: Self-pay | Admitting: Internal Medicine

## 2016-12-22 NOTE — Telephone Encounter (Signed)
Letter was revised

## 2016-12-23 NOTE — Telephone Encounter (Signed)
Jamie Thompson (Man X Mast, NP assistant) please document any follow-up that was done on this message and sign encounter when request complete

## 2016-12-23 NOTE — Telephone Encounter (Signed)
Letter faxed to [facility] 12/22/2016.

## 2016-12-29 NOTE — Telephone Encounter (Signed)
This patient resides at [facility] in the Assisted Living, they use [facility] and scripts are refilled at this facility. They should not need a hard copy script if so we will take care of this at anytime during the week.

## 2016-12-29 NOTE — Telephone Encounter (Signed)
Jamie Thompson or Manxie please respond in the encounter if any follow-up was done on the message. If yes please document and close encounter

## 2017-01-18 ENCOUNTER — Other Ambulatory Visit: Payer: Self-pay

## 2017-01-18 MED ORDER — HYDROCODONE-ACETAMINOPHEN 5-325 MG PO TABS: ORAL_TABLET | ORAL | 0 refills | Status: DC

## 2017-01-18 NOTE — Telephone Encounter (Signed)
Company secretary Phone # [phone] Fax [phone]

## 2017-02-17 ENCOUNTER — Other Ambulatory Visit: Payer: Self-pay | Admitting: *Deleted

## 2017-02-17 MED ORDER — HYDROCODONE-ACETAMINOPHEN 5-325 MG PO TABS: ORAL_TABLET | ORAL | 0 refills | Status: DC

## 2017-02-17 NOTE — Telephone Encounter (Signed)
[facility].

## 2017-03-18 ENCOUNTER — Telehealth: Payer: Self-pay | Admitting: *Deleted

## 2017-03-18 ENCOUNTER — Non-Acute Institutional Stay: Payer: Medicare Other | Admitting: Nurse Practitioner

## 2017-03-18 DIAGNOSIS — F0390 Unspecified dementia without behavioral disturbance: Secondary | ICD-10-CM

## 2017-03-18 DIAGNOSIS — I482 Chronic atrial fibrillation, unspecified: Secondary | ICD-10-CM

## 2017-03-18 DIAGNOSIS — I10 Essential (primary) hypertension: Secondary | ICD-10-CM | POA: Diagnosis not present

## 2017-03-18 DIAGNOSIS — E039 Hypothyroidism, unspecified: Secondary | ICD-10-CM

## 2017-03-18 NOTE — Assessment & Plan Note (Signed)
10/29/16 TSH 1.58, continue Aricept, 11/11/16 MMSE 20/30

## 2017-03-18 NOTE — Telephone Encounter (Signed)
Spoke with patient's son- in-law regarding her visit with [facility], he stated that she is still in good health but memory is going but that is understandable with her age.

## 2017-03-18 NOTE — Assessment & Plan Note (Signed)
10/29/16 TSH 1.58 Continue Levothyroxine 74mcg daily.

## 2017-03-18 NOTE — Assessment & Plan Note (Addendum)
controlled, continue Norvasc 2.5mg , Carvedilol 6.25mg  bid,  Maxzide 37.5-25mg , 1/2 tab daily, Lisinopril 10mg  daily, 12/08/16 Na 136, K 4.5, Bun 27, creat 0.91

## 2017-03-18 NOTE — Assessment & Plan Note (Addendum)
Heart rate is in control, continue Amlodipine 2.5mg , Carvedilol 6.25mg  bid, off Xarelto 15mg  daily. 12/08/16 Na 136, K 4.5, Bun 27, creat 0.91

## 2017-03-18 NOTE — Progress Notes (Signed)
Provider:  Marlana Latus NP  Location:   Clinic [facility]   Place of Service:     PCP: Estill Dooms, MD Patient Care Team: Estill Dooms, MD as PCP - General (Internal Medicine) Elane Peabody X, NP as Nurse Practitioner (Internal Medicine) [name], MD as Attending Physician (Neurology)  Extended Emergency Contact Information Primary Emergency Contact: Key,Caroline Address: [place] Phone: [phone] Mobile Phone: [phone] Relation: Daughter Secondary Emergency Contact: Key,Tim  [place] Mobile Phone: [phone] Relation: Son  Code Status: DNR Goals of Care: Advanced Directive information Advanced Directives 12/03/2016  Does Patient Have a Medical Advance Directive? Yes  Type of Paramedic of [place];Living will;Out of facility DNR (pink MOST or yellow form)  Does patient want to make changes to medical advance directive? No - Patient declined  Copy of [place] in Chart? Yes  Would patient like information on creating a medical advance directive? -  Pre-existing out of facility DNR order (yellow form or pink MOST form) Yellow form placed in chart (order not valid for inpatient use)      No chief complaint on file.   HPI: Patient is a 83 y.o. female seen today for evaluation of chronic medical conditions.  Hx of HTN controlled Norvasc 2.5mg , Carvedilol 6.25mg  bid,  Maxzide 37.5-25mg , 1/2 tab daily. Afib, heart rate is in control, taking Norvasc 2.5mg  daily, Carvedilol 6.25mg  bid, Xarelto 15mg  daily. Dementia, taking Aricept, resides in [place] for care assistance. Hypothyroidism, TSH 1.58 10/29/16, on Levothyroxine 69mcg daily. Sustained multiple the left rib fxs from the fall 11/15/16, healing nicely, prn Norco for pain.   Past Medical History:  Diagnosis Date  . Adenomatous colon polyp 07/17/97  . Allergic rhinitis   . Allergy   . Anxiety disorder     . Atrial fibrillation ([place])   . Chronic anticoagulation   . Diverticulosis   . History of shingles   . Hypercholesteremia   . Hypertension   . Hypothyroidism   . Internal hemorrhoids   . Melanoma ([place])   . Memory loss   . Meniere disease   . Tension headache    Past Surgical History:  Procedure Laterality Date  . CATARACT EXTRACTION Right 2012    reports that she has never smoked. She has never used smokeless tobacco. She reports that she does not drink alcohol or use drugs. Social History   Social History  . Marital status: Single    Spouse name: N/A  . Number of children: 2  . Years of education: 64yr colg   Occupational History  . Retired Scientist, research (medical)    Social History Main Topics  . Smoking status: Never Smoker  . Smokeless tobacco: Never Used  . Alcohol use No  . Drug use: No  . Sexual activity: No   Other Topics Concern  . Not on file   Social History Narrative   1 cup of coffee a day, occasionally drinks tea or soda   Social History     Marital status: Widowed             Spouse name:                        Years of education:        Number of children: 2  Occupational History   Occupation : Surveyor, mining            Comment                Retired                                       Social History Main Topics     Smoking status: Never Smoker                                                                  Smokeless tobacco: Never Used                         Alcohol use: No               Drug use: No               Sexual activity: Not on file            Other Topics            Concern     None on file      Social History Narrative     1 cup of coffee a day, occasionally drinks tea or soda.   She does not exercise, has a living will, DNR and POA   Lives at [place] Status Survey:    Family History  Problem Relation Age of Onset  . Stroke Father   . Clotting disorder Father    . Colon cancer Mother   . Colon cancer Sister   . Clotting disorder Sister     Health Maintenance  Topic Date Due  . Samul Dada  04/28/1944  . DEXA SCAN  04/28/1992  . PNA vac Low Risk Adult (1 of 2 - PCV13) 04/28/1992  . INFLUENZA VACCINE  04/28/2017    Allergies  Allergen Reactions  . Amitriptyline     Urinary Retention  . Amlodipine Swelling  . Erythromycin Hives  . Nitrofurantoin Nausea Only  . Sulfa Antibiotics     "really ill"  . Tavist [Clemastine]     Urinary hesitancy  . Trimethoprim     Cramps  . Verapamil     Constipation    Allergies as of 03/18/2017      Reactions   Amitriptyline    Urinary Retention   Amlodipine Swelling   Erythromycin Hives   Nitrofurantoin Nausea Only   Sulfa Antibiotics    "really ill"   Tavist [clemastine]    Urinary hesitancy   Trimethoprim    Cramps   Verapamil    Constipation      Medication List       Accurate as of 03/18/17  1:45 PM. Always use your most recent med list.          acetaminophen 325 MG tablet Commonly known as:  TYLENOL Take 650 mg by mouth every 6 (six) hours as needed for headache.   amLODipine 2.5 MG tablet Commonly known as:  NORVASC Take 2.5  mg by mouth daily.   CALCIUM-D PO Take 1 tablet by mouth daily.   carvedilol 6.25 MG tablet Commonly known as:  COREG Take 1 tablet (6.25 mg total) by mouth 2 (two) times daily with a meal.   cefUROXime 250 MG tablet Commonly known as:  CEFTIN Take 1 tablet (250 mg total) by mouth 2 (two) times daily with a meal.   donepezil 10 MG tablet Commonly known as:  ARICEPT Take 10 mg by mouth at bedtime.   HYDROcodone-acetaminophen 5-325 MG tablet Commonly known as:  NORCO/VICODIN Take 1/2 tablet by mouth two times a day as needed for pain   levothyroxine 50 MCG tablet Commonly known as:  SYNTHROID, LEVOTHROID Take 50 mcg by mouth See admin instructions. Only on Tuesday, Thursday, Saturday, Sunday.   levothyroxine 75 MCG tablet Commonly  known as:  SYNTHROID, LEVOTHROID Take 75 mcg by mouth. Take one tablet Monday and Friday   phenazopyridine 100 MG tablet Commonly known as:  PYRIDIUM Take 1 tablet (100 mg total) by mouth 3 (three) times daily with meals.   triamterene-hydrochlorothiazide 37.5-25 MG tablet Commonly known as:  MAXZIDE-25 Take 0.5 tablets by mouth daily.       Review of Systems  Constitutional: Negative for activity change, appetite change, chills, diaphoresis, fatigue and fever.  HENT: Positive for hearing loss. Negative for congestion, dental problem, drooling, ear discharge, ear pain, facial swelling, mouth sores, nosebleeds, postnasal drip, rhinorrhea, sinus pain, sinus pressure, sneezing, sore throat, tinnitus, trouble swallowing and voice change.        HOH, hearing aid left  Eyes: Negative for photophobia, pain, discharge, redness, itching and visual disturbance.  Respiratory: Negative for apnea, cough, choking, chest tightness, shortness of breath, wheezing and stridor.   Cardiovascular: Negative for chest pain, palpitations and leg swelling.  Gastrointestinal: Negative for abdominal distention, abdominal pain, anal bleeding, blood in stool, constipation, diarrhea, nausea and vomiting.  Endocrine: Negative for cold intolerance, heat intolerance, polydipsia, polyphagia and polyuria.  Genitourinary: Negative for difficulty urinating, dysuria, flank pain, frequency, pelvic pain and urgency.  Musculoskeletal: Positive for gait problem. Negative for arthralgias, back pain, joint swelling, myalgias and neck pain.  Skin: Negative for color change, pallor, rash and wound.  Allergic/Immunologic: Negative for environmental allergies, food allergies and immunocompromised state.  Neurological: Negative for dizziness, tremors, seizures, syncope, facial asymmetry, speech difficulty, weakness, light-headedness, numbness and headaches.  Hematological: Negative for adenopathy. Bruises/bleeds easily.    Psychiatric/Behavioral: Positive for confusion and decreased concentration. Negative for agitation, dysphoric mood, hallucinations, sleep disturbance and suicidal ideas. The patient is not nervous/anxious and is not hyperactive.     There were no vitals filed for this visit. There is no height or weight on file to calculate BMI. Physical Exam  Constitutional: She appears well-developed and well-nourished. No distress.  HENT:  Head: Normocephalic and atraumatic.  Right Ear: External ear normal.  Left Ear: External ear normal.  Mouth/Throat: Oropharynx is clear and moist.  Eyes: EOM are normal. Pupils are equal, round, and reactive to light. Right eye exhibits no discharge. Left eye exhibits no discharge. No scleral icterus.  Neck: Normal range of motion. Neck supple. No JVD present. No tracheal deviation present. No thyromegaly present.  Cardiovascular: Normal rate, regular rhythm and intact distal pulses.  Exam reveals no gallop and no friction rub.   No murmur heard. Afib, heart rate is in control.   Pulmonary/Chest: Effort normal and breath sounds normal. No stridor. No respiratory distress. She has no wheezes. She has no rales.  Abdominal: Soft. Bowel sounds are normal. She exhibits no distension. There is no tenderness. There is no rebound and no guarding.  Musculoskeletal: Normal range of motion. She exhibits no edema, tenderness or deformity.  Neurological: She is alert. She has normal reflexes. No cranial nerve deficit. She exhibits normal muscle tone. Coordination normal.  Skin: Skin is warm and dry. She is not diaphoretic.  Psychiatric:  Dementia, confusion, irritated when unable to remember things.     Labs reviewed: Basic Metabolic Panel:  Recent Labs  11/15/16 1335 11/16/16 0512 11/17/16 0550 12/08/16  NA 135 138 136 136*  K 3.5 3.5 3.1* 4.5  CL 97* 103 100*  --   CO2 28 26 27   --   GLUCOSE 102* 93 108*  --   BUN 18 18 18  27*  CREATININE 0.89 0.84 0.80  --    CALCIUM 9.4 9.2 9.3  --    Liver Function Tests:  Recent Labs  11/10/16 11/15/16 1335  AST 17 23  ALT 13 18  ALKPHOS 79 71  BILITOT  --  0.9  PROT  --  6.7  ALBUMIN  --  4.1    Recent Labs  11/15/16 1335  LIPASE 33   No results for input(s): AMMONIA in the last 8760 hours. CBC:  Recent Labs  06/01/16 0950  11/15/16 1335 11/16/16 0512 11/17/16 0550  WBC 7.0  < > 9.9 11.0* 10.7*  NEUTROABS 5.2  --  7.3  --   --   HGB 12.0  < > 12.2 12.0 11.4*  HCT 37.3  < > 37.3 37.3 35.2*  MCV 93.5  < > 91.2 92.3 91.2  PLT 209  < > 265 260 252  < > = values in this interval not displayed. Cardiac Enzymes:  Recent Labs  06/02/16 0150 11/15/16 1616 11/15/16 2132  TROPONINI <0.03 <0.03 <0.03   BNP: Invalid input(s): POCBNP Lab Results  Component Value Date   HGBA1C 5.7 11/10/2016   Lab Results  Component Value Date   TSH 4.412 06/01/2016   No results found for: VITAMINB12 No results found for: FOLATE No results found for: IRON, TIBC, FERRITIN  Imaging and Procedures obtained prior to SNF admission: Dg Chest Portable 1 View  Result Date: 06/01/2016 CLINICAL DATA:  Chest pain and shortness of breath this morning with lightheadedness, and tibia, nausea and weakness. EXAM: PORTABLE CHEST 1 VIEW COMPARISON:  None. FINDINGS: The cardiac silhouette, mediastinal and hilar contours are within normal limits for age. There is mild tortuosity and calcification of the thoracic aorta. The lungs are clear. No pleural effusion. No pulmonary lesions. The bony thorax is intact. IMPRESSION: No acute cardiopulmonary findings. Electronically Signed   By: Marijo Sanes M.D.   On: 06/01/2016 10:31    Assessment/Plan  Atrial fibrillation (HCC) Heart rate is in control, continue Amlodipine 2.5mg , Carvedilol 6.25mg  bid, off Xarelto 15mg  daily. 12/08/16 Na 136, K 4.5, Bun 27, creat 0.91  HTN (hypertension) controlled, continue Norvasc 2.5mg , Carvedilol 6.25mg  bid,  Maxzide 37.5-25mg , 1/2 tab  daily, Lisinopril 10mg  daily, 12/08/16 Na 136, K 4.5, Bun 27, creat 0.91   Hypothyroidism 10/29/16 TSH 1.58 Continue Levothyroxine 7mcg daily.   Dementia 10/29/16 TSH 1.58, continue Aricept, 11/11/16 MMSE 20/30  Family/ staff Communication: [place]  Labs/tests ordered: CBC,  CMP,  Hgb a1c, lipid panel   Provider:  Marlana Latus NP  Location:   [facility]   Place of Service:   Clinic  PCP: Estill Dooms, MD Patient Care Team: Jeanmarie Hubert  Darnell Level, MD as PCP - General (Internal Medicine) Amarah Brossman X, NP as Nurse Practitioner (Internal Medicine) [name], MD as Attending Physician (Neurology)  Extended Emergency Contact Information Primary Emergency Contact: Key,Caroline Address: [place] Phone: [phone] Mobile Phone: [phone] Relation: Daughter Secondary Emergency Contact: Key,Tim  [place] Mobile Phone: [phone] Relation: Son  Code Status: DNR Goals of Care: Advanced Directive information Advanced Directives 12/03/2016  Does Patient Have a Medical Advance Directive? Yes  Type of Paramedic of [place];Living will;Out of facility DNR (pink MOST or yellow form)  Does patient want to make changes to medical advance directive? No - Patient declined  Copy of [place] in Chart? Yes  Would patient like information on creating a medical advance directive? -  Pre-existing out of facility DNR order (yellow form or pink MOST form) Yellow form placed in chart (order not valid for inpatient use)      No chief complaint on file.   HPI: Patient is a 83 y.o. female seen today for admission to [facility] clinic. Hx of HTN controlled Norvasc 2.5mg , Carvedilol 6.25mg  bid,  Maxzide 37.5-25mg , 1/2 tab daily. Afib, heart rate is in control, taking Norvasc 2.5mg  daily, Carvedilol 6.25mg  bid, Xarelto 15mg  daily. Dementia, taking Aricept, recently admitted to [place] for care  assistance.   Past Medical History:  Diagnosis Date  . Adenomatous colon polyp 07/17/97  . Allergic rhinitis   . Allergy   . Anxiety disorder   . Atrial fibrillation ([place])   . Chronic anticoagulation   . Diverticulosis   . History of shingles   . Hypercholesteremia   . Hypertension   . Hypothyroidism   . Internal hemorrhoids   . Melanoma ([place])   . Memory loss   . Meniere disease   . Tension headache    Past Surgical History:  Procedure Laterality Date  . CATARACT EXTRACTION Right 2012    reports that she has never smoked. She has never used smokeless tobacco. She reports that she does not drink alcohol or use drugs. Social History   Social History  . Marital status: Single    Spouse name: N/A  . Number of children: 2  . Years of education: 25yr colg   Occupational History  . Retired Scientist, research (medical)    Social History Main Topics  . Smoking status: Never Smoker  . Smokeless tobacco: Never Used  . Alcohol use No  . Drug use: No  . Sexual activity: No   Other Topics Concern  . Not on file   Social History Narrative   1 cup of coffee a day, occasionally drinks tea or soda   Social History     Marital status: Widowed             Spouse name:                        Years of education:        Number of children: 2               Occupational History   Occupation : Secretary/administrator                Retired  Social History Main Topics     Smoking status: Never Smoker                                                                  Smokeless tobacco: Never Used                         Alcohol use: No               Drug use: No               Sexual activity: Not on file            Other Topics            Concern     None on file      Social History Narrative     1 cup of coffee a day, occasionally drinks tea or soda.   She does not exercise, has a living will, DNR and POA   Lives at [place] Status Survey:    Family History  Problem Relation Age of Onset  . Stroke Father   . Clotting disorder Father   . Colon cancer Mother   . Colon cancer Sister   . Clotting disorder Sister     Health Maintenance  Topic Date Due  . Samul Dada  04/28/1944  . DEXA SCAN  04/28/1992  . PNA vac Low Risk Adult (1 of 2 - PCV13) 04/28/1992  . INFLUENZA VACCINE  04/28/2017    Allergies  Allergen Reactions  . Amitriptyline     Urinary Retention  . Amlodipine Swelling  . Erythromycin Hives  . Nitrofurantoin Nausea Only  . Sulfa Antibiotics     "really ill"  . Tavist [Clemastine]     Urinary hesitancy  . Trimethoprim     Cramps  . Verapamil     Constipation    Allergies as of 03/18/2017      Reactions   Amitriptyline    Urinary Retention   Amlodipine Swelling   Erythromycin Hives   Nitrofurantoin Nausea Only   Sulfa Antibiotics    "really ill"   Tavist [clemastine]    Urinary hesitancy   Trimethoprim    Cramps   Verapamil    Constipation      Medication List       Accurate as of 03/18/17  1:45 PM. Always use your most recent med list.          acetaminophen 325 MG tablet Commonly known as:  TYLENOL Take 650 mg by mouth every 6 (six) hours as needed for headache.   amLODipine 2.5 MG tablet Commonly known as:  NORVASC Take 2.5 mg by mouth daily.   CALCIUM-D PO Take 1 tablet by mouth daily.   carvedilol 6.25 MG tablet Commonly known as:  COREG Take 1 tablet (6.25 mg total) by mouth 2 (two) times daily with a meal.   cefUROXime 250 MG tablet Commonly known as:  CEFTIN Take 1 tablet (250 mg total) by mouth 2 (two) times daily with a meal.   donepezil 10 MG tablet Commonly known as:  ARICEPT Take 10 mg by mouth at  bedtime.   HYDROcodone-acetaminophen 5-325 MG tablet Commonly known as:  NORCO/VICODIN Take 1/2 tablet by mouth two times a day as needed for pain   levothyroxine 50 MCG tablet Commonly known as:   SYNTHROID, LEVOTHROID Take 50 mcg by mouth See admin instructions. Only on Tuesday, Thursday, Saturday, Sunday.   levothyroxine 75 MCG tablet Commonly known as:  SYNTHROID, LEVOTHROID Take 75 mcg by mouth. Take one tablet Monday and Friday   phenazopyridine 100 MG tablet Commonly known as:  PYRIDIUM Take 1 tablet (100 mg total) by mouth 3 (three) times daily with meals.   triamterene-hydrochlorothiazide 37.5-25 MG tablet Commonly known as:  MAXZIDE-25 Take 0.5 tablets by mouth daily.       Review of Systems  Constitutional: Negative for activity change, appetite change, chills, diaphoresis, fatigue and fever.  HENT: Positive for hearing loss. Negative for congestion, dental problem, drooling, ear discharge, ear pain, facial swelling, mouth sores, nosebleeds, postnasal drip, rhinorrhea, sinus pain, sinus pressure, sneezing, sore throat, tinnitus, trouble swallowing and voice change.        HOH, hearing aid left  Eyes: Negative for photophobia, pain, discharge, redness, itching and visual disturbance.  Respiratory: Negative for apnea, cough, choking, chest tightness, shortness of breath, wheezing and stridor.   Cardiovascular: Negative for chest pain, palpitations and leg swelling.  Gastrointestinal: Negative for abdominal distention, abdominal pain, anal bleeding, blood in stool, constipation, diarrhea, nausea and vomiting.  Endocrine: Negative for cold intolerance, heat intolerance, polydipsia, polyphagia and polyuria.  Genitourinary: Negative for difficulty urinating, dysuria, flank pain, frequency, pelvic pain and urgency.  Musculoskeletal: Positive for gait problem. Negative for arthralgias, back pain, joint swelling, myalgias and neck pain.  Skin: Negative for color change, pallor, rash and wound.  Allergic/Immunologic: Negative for environmental allergies, food allergies and immunocompromised state.  Neurological: Negative for dizziness, tremors, seizures, syncope, facial  asymmetry, speech difficulty, weakness, light-headedness, numbness and headaches.  Hematological: Negative for adenopathy. Bruises/bleeds easily.  Psychiatric/Behavioral: Positive for confusion and decreased concentration. Negative for agitation, dysphoric mood, hallucinations, sleep disturbance and suicidal ideas. The patient is not nervous/anxious and is not hyperactive.     There were no vitals filed for this visit. There is no height or weight on file to calculate BMI. Physical Exam  Constitutional: She appears well-developed and well-nourished. No distress.  HENT:  Head: Normocephalic and atraumatic.  Right Ear: External ear normal.  Left Ear: External ear normal.  Mouth/Throat: Oropharynx is clear and moist.  Eyes: EOM are normal. Pupils are equal, round, and reactive to light. Right eye exhibits no discharge. Left eye exhibits no discharge. No scleral icterus.  Neck: Normal range of motion. Neck supple. No JVD present. No tracheal deviation present. No thyromegaly present.  Cardiovascular: Normal rate, regular rhythm and intact distal pulses.  Exam reveals no gallop and no friction rub.   No murmur heard. Afib, heart rate is in control.   Pulmonary/Chest: Effort normal and breath sounds normal. No stridor. No respiratory distress. She has no wheezes. She has no rales.  Abdominal: Soft. Bowel sounds are normal. She exhibits no distension. There is no tenderness. There is no rebound and no guarding.  Musculoskeletal: Normal range of motion. She exhibits no edema, tenderness or deformity.  Neurological: She is alert. She has normal reflexes. She displays normal reflexes. A cranial nerve deficit is present. She exhibits normal muscle tone. Coordination normal.  Skin: Skin is warm and dry. She is not diaphoretic.  Psychiatric:  Dementia, confusion, irritated when unable to remember things.  Labs reviewed: Basic Metabolic Panel:  Recent Labs  11/15/16 1335 11/16/16 0512  11/17/16 0550 12/08/16  NA 135 138 136 136*  K 3.5 3.5 3.1* 4.5  CL 97* 103 100*  --   CO2 28 26 27   --   GLUCOSE 102* 93 108*  --   BUN 18 18 18  27*  CREATININE 0.89 0.84 0.80  --   CALCIUM 9.4 9.2 9.3  --    Liver Function Tests:  Recent Labs  11/10/16 11/15/16 1335  AST 17 23  ALT 13 18  ALKPHOS 79 71  BILITOT  --  0.9  PROT  --  6.7  ALBUMIN  --  4.1    Recent Labs  11/15/16 1335  LIPASE 33   No results for input(s): AMMONIA in the last 8760 hours. CBC:  Recent Labs  06/01/16 0950  11/15/16 1335 11/16/16 0512 11/17/16 0550  WBC 7.0  < > 9.9 11.0* 10.7*  NEUTROABS 5.2  --  7.3  --   --   HGB 12.0  < > 12.2 12.0 11.4*  HCT 37.3  < > 37.3 37.3 35.2*  MCV 93.5  < > 91.2 92.3 91.2  PLT 209  < > 265 260 252  < > = values in this interval not displayed. Cardiac Enzymes:  Recent Labs  06/02/16 0150 11/15/16 1616 11/15/16 2132  TROPONINI <0.03 <0.03 <0.03   BNP: Invalid input(s): POCBNP Lab Results  Component Value Date   HGBA1C 5.7 11/10/2016   Lab Results  Component Value Date   TSH 4.412 06/01/2016   No results found for: VITAMINB12 No results found for: FOLATE No results found for: IRON, TIBC, FERRITIN  Imaging and Procedures obtained prior to SNF admission: Dg Chest Portable 1 View  Result Date: 06/01/2016 CLINICAL DATA:  Chest pain and shortness of breath this morning with lightheadedness, and tibia, nausea and weakness. EXAM: PORTABLE CHEST 1 VIEW COMPARISON:  None. FINDINGS: The cardiac silhouette, mediastinal and hilar contours are within normal limits for age. There is mild tortuosity and calcification of the thoracic aorta. The lungs are clear. No pleural effusion. No pulmonary lesions. The bony thorax is intact. IMPRESSION: No acute cardiopulmonary findings. Electronically Signed   By: Marijo Sanes M.D.   On: 06/01/2016 10:31    Assessment/Plan  Atrial fibrillation (HCC) Heart rate is in control, continue Amlodipine 2.5mg , Carvedilol  6.25mg  bid, off Xarelto 15mg  daily. 12/08/16 Na 136, K 4.5, Bun 27, creat 0.91  HTN (hypertension) controlled, continue Norvasc 2.5mg , Carvedilol 6.25mg  bid,  Maxzide 37.5-25mg , 1/2 tab daily, Lisinopril 10mg  daily, 12/08/16 Na 136, K 4.5, Bun 27, creat 0.91   Hypothyroidism 10/29/16 TSH 1.58 Continue Levothyroxine 10mcg daily.   Dementia 10/29/16 TSH 1.58, continue Aricept, 11/11/16 MMSE 20/30  Family/ staff Communication: [place]  Labs/tests ordered: none

## 2017-05-25 ENCOUNTER — Non-Acute Institutional Stay: Payer: Medicare Other | Admitting: Nurse Practitioner

## 2017-05-25 ENCOUNTER — Encounter: Payer: Self-pay | Admitting: Nurse Practitioner

## 2017-05-25 DIAGNOSIS — E039 Hypothyroidism, unspecified: Secondary | ICD-10-CM

## 2017-05-25 DIAGNOSIS — I10 Essential (primary) hypertension: Secondary | ICD-10-CM | POA: Diagnosis not present

## 2017-05-25 DIAGNOSIS — F0390 Unspecified dementia without behavioral disturbance: Secondary | ICD-10-CM | POA: Diagnosis not present

## 2017-05-25 NOTE — Progress Notes (Signed)
Location:  [facility] Room Number: 907 Place of Service:  ALF [phone]) Provider:  Mayme Profeta, Manxie   NP  Blanchie Serve, MD  Patient Care Team: Blanchie Serve, MD as PCP - General (Internal Medicine) Mckenzye Cutright X, NP as Nurse Practitioner (Internal Medicine) [name], MD as Attending Physician (Neurology)  Extended Emergency Contact Information Primary Emergency Contact: Key,Caroline Address: [place] Phone: [phone] Mobile Phone: [phone] Relation: Daughter Secondary Emergency Contact: Key,Tim  [place] Mobile Phone: [phone] Relation: Son  Code Status:  DNR Goals of care: Advanced Directive information Advanced Directives 05/25/2017  Does Patient Have a Medical Advance Directive? Yes  Type of Paramedic of [place];Living will;Out of facility DNR (pink MOST or yellow form)  Does patient want to make changes to medical advance directive? No - Patient declined  Copy of [place] in Chart? Yes  Would patient like information on creating a medical advance directive? -  Pre-existing out of facility DNR order (yellow form or pink MOST form) Yellow form placed in chart (order not valid for inpatient use)     Chief Complaint  Patient presents with  . Acute Visit    increased confusion    HPI:  Pt is a 83 y.o. female seen today for an acute visit for increased confusion, she has hx of dementia, resides in [facility], takes Aricept to preserve her memory. She appears agitated and angry upon my visit, kept saying I am fine, wasting your time to check on me, but she denied pain, cough, chest pressure, palpitation, nausea, vomiting, dysuria, lower abd or CVA tenderness, she is afebrile. Her blood pressure is well controlled, taking Lisinopril 10mg , Carvedilol 6.25mg  bid, Amlodipine 2.73m daily. Hypothyroidism: takes Levothyroxine, last TSH  1.58 10/29/16   Past Medical History:  Diagnosis Date  . Adenomatous colon polyp 07/17/97  . Allergic rhinitis   . Allergy   . Anxiety disorder   . Atrial fibrillation ([place])   . Chronic anticoagulation   . Diverticulosis   . History of shingles   . Hypercholesteremia   . Hypertension   . Hypothyroidism   . Internal hemorrhoids   . Melanoma ([place])   . Memory loss   . Meniere disease   . Tension headache    Past Surgical History:  Procedure Laterality Date  . CATARACT EXTRACTION Right 2012    Allergies  Allergen Reactions  . Amitriptyline     Urinary Retention  . Amlodipine Swelling  . Erythromycin Hives  . Nitrofurantoin Nausea Only  . Sulfa Antibiotics     "really ill"  . Tavist [Clemastine]     Urinary hesitancy  . Trimethoprim     Cramps  . Verapamil     Constipation    Outpatient Encounter Prescriptions as of 05/25/2017  Medication Sig  . acetaminophen (TYLENOL) 325 MG tablet Take 650 mg by mouth every 6 (six) hours as needed for headache.  Marland Kitchen amLODipine (NORVASC) 2.5 MG tablet Take 2.5 mg by mouth daily.   . Calcium-Phosphorus-Vitamin D (CITRACAL CALCIUM GUMMIES PO) Take by mouth. 1 gummy given daily.  . carvedilol (COREG) 6.25 MG tablet Take 1 tablet (6.25 mg total) by mouth 2 (two) times daily with a meal.  . donepezil (ARICEPT) 10 MG tablet Take 10 mg by mouth at bedtime.   . feeding supplement (BOOST / RESOURCE BREEZE) LIQD Take 1 Container by  mouth 2 (two) times daily between meals.  Marland Kitchen levothyroxine (SYNTHROID, LEVOTHROID) 50 MCG tablet Take 50 mcg by mouth See admin instructions. Only on Tuesday, Thursday, Saturday, Sunday.  . levothyroxine (SYNTHROID, LEVOTHROID) 75 MCG tablet Take 75 mcg by mouth. Take one tablet Monday and Friday  . lisinopril (PRINIVIL,ZESTRIL) 10 MG tablet Take 10 mg by mouth daily.  Marland Kitchen triamterene-hydrochlorothiazide (MAXZIDE-25) 37.5-25 MG tablet Take 0.5 tablets by mouth daily.   . [DISCONTINUED] Calcium Carbonate-Vitamin D  (CALCIUM-D PO) Take 1 tablet by mouth daily.  . [DISCONTINUED] cefUROXime (CEFTIN) 250 MG tablet Take 1 tablet (250 mg total) by mouth 2 (two) times daily with a meal.  . [DISCONTINUED] HYDROcodone-acetaminophen (NORCO/VICODIN) 5-325 MG tablet Take 1/2 tablet by mouth two times a day as needed for pain  . [DISCONTINUED] phenazopyridine (PYRIDIUM) 100 MG tablet Take 1 tablet (100 mg total) by mouth 3 (three) times daily with meals.   No facility-administered encounter medications on file as of 05/25/2017.     Review of Systems  Constitutional: Negative for activity change, appetite change, chills, diaphoresis, fatigue and fever.  HENT: Negative for congestion.   Respiratory: Negative for cough, choking, chest tightness and shortness of breath.   Cardiovascular: Negative for chest pain, palpitations and leg swelling.  Gastrointestinal: Negative for abdominal distention, abdominal pain, constipation, diarrhea and nausea.  Genitourinary: Negative for difficulty urinating, dysuria, flank pain, frequency, hematuria and urgency.  Neurological: Negative for dizziness, speech difficulty, weakness and headaches.  Psychiatric/Behavioral: Positive for agitation and confusion. Negative for hallucinations and sleep disturbance. The patient is not nervous/anxious.     Immunization History  Administered Date(s) Administered  . PPD Test 09/05/2016   Pertinent  Health Maintenance Due  Topic Date Due  . DEXA SCAN  04/28/1992  . PNA vac Low Risk Adult (1 of 2 - PCV13) 04/28/1992  . INFLUENZA VACCINE  04/28/2017   Fall Risk  01/28/2016  Falls in the past year? No   Functional Status Survey:    Vitals:   05/25/17 1604  BP: 128/72  Pulse: 70  Resp: 18  Temp: 97.8 F (36.6 C)  Weight: 123 lb 9.6 oz (56.1 kg)  Height: 5\' 5"  (3.536 m)   Body mass index is 20.57 kg/m. Physical Exam  Constitutional: She appears well-developed and well-nourished.  HENT:  Head: Normocephalic and atraumatic.    Cardiovascular: Normal rate, regular rhythm and normal heart sounds.   No murmur heard. Pulmonary/Chest: Effort normal and breath sounds normal. She has no wheezes. She has no rales.  Abdominal: Soft. Bowel sounds are normal.  Neurological: She is alert. No cranial nerve deficit. She exhibits normal muscle tone. Coordination normal.  Oriented to person and place  Psychiatric:  She is angry, keeps saying I am fine.     Labs reviewed:  Recent Labs  11/15/16 1335 11/16/16 0512 11/17/16 0550 12/08/16  NA 135 138 136 136*  K 3.5 3.5 3.1* 4.5  CL 97* 103 100*  --   CO2 28 26 27   --   GLUCOSE 102* 93 108*  --   BUN 18 18 18  27*  CREATININE 0.89 0.84 0.80  --   CALCIUM 9.4 9.2 9.3  --     Recent Labs  11/10/16 11/15/16 1335  AST 17 23  ALT 13 18  ALKPHOS 79 71  BILITOT  --  0.9  PROT  --  6.7  ALBUMIN  --  4.1    Recent Labs  06/01/16 0950  11/15/16 1335 11/16/16 0512 11/17/16 0550  WBC 7.0  < > 9.9 11.0* 10.7*  NEUTROABS 5.2  --  7.3  --   --   HGB 12.0  < > 12.2 12.0 11.4*  HCT 37.3  < > 37.3 37.3 35.2*  MCV 93.5  < > 91.2 92.3 91.2  PLT 209  < > 265 260 252  < > = values in this interval not displayed. Lab Results  Component Value Date   TSH 4.412 06/01/2016   Lab Results  Component Value Date   HGBA1C 5.7 11/10/2016   Lab Results  Component Value Date   CHOL 200 11/10/2016   HDL 63 11/10/2016   LDLCALC 121 11/10/2016   TRIG 81 11/10/2016    Significant Diagnostic Results in last 30 days:  No results found.  Assessment/Plan Hypothyroidism Last TSH 1.58 10/29/16, no change of Levothyroxine, update TSH  Dementia She is more agitated and angry, her confusion is at her baseline, continue Aricept, update TSH CBC CMP  HTN (hypertension) Controlled, continue Amlodipine, Lisinopril, Carvedilol.      Family/ staff Communication: plan of care reviewed with the patient and charge nurse  Labs/tests ordered: CBC CMP TSH  Time spend 25 minutes

## 2017-05-25 NOTE — Assessment & Plan Note (Signed)
Last TSH 1.58 10/29/16, no change of Levothyroxine, update TSH

## 2017-05-25 NOTE — Assessment & Plan Note (Signed)
Controlled, continue Amlodipine, Lisinopril, Carvedilol.

## 2017-05-25 NOTE — Assessment & Plan Note (Signed)
She is more agitated and angry, her confusion is at her baseline, continue Aricept, update TSH CBC CMP

## 2017-05-27 DIAGNOSIS — E039 Hypothyroidism, unspecified: Secondary | ICD-10-CM | POA: Diagnosis not present

## 2017-05-27 DIAGNOSIS — G3184 Mild cognitive impairment, so stated: Secondary | ICD-10-CM | POA: Diagnosis not present

## 2017-05-27 LAB — HEPATIC FUNCTION PANEL
ALK PHOS: 59 (ref 25–125)
ALT: 12 (ref 7–35)
AST: 17 (ref 13–35)
BILIRUBIN, TOTAL: 0.5

## 2017-05-27 LAB — CBC AND DIFFERENTIAL
HEMATOCRIT: 35 — AB (ref 36–46)
HEMOGLOBIN: 11.6 — AB (ref 12.0–16.0)
Platelets: 250 (ref 150–399)
WBC: 8.1

## 2017-05-27 LAB — BASIC METABOLIC PANEL
BUN: 29 — AB (ref 4–21)
Creatinine: 1 (ref <=1.1)
Glucose: 87
Potassium: 4.2 (ref 3.4–5.3)
SODIUM: 137 (ref 137–147)

## 2017-05-27 LAB — TSH: TSH: 4.43 (ref <=5.90)

## 2017-05-28 ENCOUNTER — Other Ambulatory Visit: Payer: Self-pay | Admitting: *Deleted

## 2017-06-07 ENCOUNTER — Encounter: Payer: Self-pay | Admitting: Nurse Practitioner

## 2017-06-07 ENCOUNTER — Non-Acute Institutional Stay: Payer: Medicare Other | Admitting: Nurse Practitioner

## 2017-06-07 DIAGNOSIS — F0280 Dementia in other diseases classified elsewhere without behavioral disturbance: Secondary | ICD-10-CM | POA: Diagnosis not present

## 2017-06-07 DIAGNOSIS — F329 Major depressive disorder, single episode, unspecified: Secondary | ICD-10-CM

## 2017-06-07 DIAGNOSIS — E039 Hypothyroidism, unspecified: Secondary | ICD-10-CM

## 2017-06-07 DIAGNOSIS — F0283 Dementia in other diseases classified elsewhere, unspecified severity, with mood disturbance: Secondary | ICD-10-CM

## 2017-06-07 DIAGNOSIS — G309 Alzheimer's disease, unspecified: Secondary | ICD-10-CM | POA: Diagnosis not present

## 2017-06-07 DIAGNOSIS — F0390 Unspecified dementia without behavioral disturbance: Secondary | ICD-10-CM

## 2017-06-07 NOTE — Assessment & Plan Note (Signed)
She is more agitated and angry, her confusion is at her baseline, continue Aricept, update TSH 4.43 05/27/17

## 2017-06-07 NOTE — Assessment & Plan Note (Signed)
Controlled, continue Levothyroxine 86mcg po daily, last TSH 4.43 05/27/17

## 2017-06-07 NOTE — Progress Notes (Signed)
Location:  [facility] Room Number: 907 Place of Service:  ALF [phone]) Provider:  Jamisha Hoeschen, Manxie  NP  Blanchie Serve, MD  Patient Care Team: Blanchie Serve, MD as PCP - General (Internal Medicine) Jillianne Gamino X, NP as Nurse Practitioner (Internal Medicine) [name], MD as Attending Physician (Neurology)  Extended Emergency Contact Information Primary Emergency Contact: Key,Caroline Address: [place] Phone: [phone] Mobile Phone: [phone] Relation: Daughter Secondary Emergency Contact: Key,Tim  [place] Mobile Phone: [phone] Relation: Son  Code Status:  DNR Goals of care: Advanced Directive information Advanced Directives 06/07/2017  Does Patient Have a Medical Advance Directive? Yes  Type of Paramedic of [place];Living will;Out of facility DNR (pink MOST or yellow form)  Does patient want to make changes to medical advance directive? No - Patient declined  Copy of [place] in Chart? Yes  Would patient like information on creating a medical advance directive? -  Pre-existing out of facility DNR order (yellow form or pink MOST form) Yellow form placed in chart (order not valid for inpatient use)     Chief Complaint  Patient presents with  . Acute Visit    Agitation,     HPI:  Pt is a 83 y.o. female seen today for an acute visit for persisted irritability, emotional outburst in setting of dementia. She takes Donepezil 10mg  qd to preserve her memory, she has difficulty of memory recall and understanding her surroundings. Hx of hypothyroidism: takes Levothyroxine 38mcg po daily, last TSH 4.43 05/27/17   Past Medical History:  Diagnosis Date  . Adenomatous colon polyp 07/17/97  . Allergic rhinitis   . Allergy   . Anxiety disorder   . Atrial fibrillation ([place])   . Chronic anticoagulation   . Diverticulosis   .  History of shingles   . Hypercholesteremia   . Hypertension   . Hypothyroidism   . Internal hemorrhoids   . Melanoma ([place])   . Memory loss   . Meniere disease   . Tension headache    Past Surgical History:  Procedure Laterality Date  . CATARACT EXTRACTION Right 2012    Allergies  Allergen Reactions  . Amitriptyline     Urinary Retention  . Amlodipine Swelling  . Erythromycin Hives  . Nitrofurantoin Nausea Only  . Sulfa Antibiotics     "really ill"  . Tavist [Clemastine]     Urinary hesitancy  . Trimethoprim     Cramps  . Verapamil     Constipation    Outpatient Encounter Prescriptions as of 06/07/2017  Medication Sig  . acetaminophen (TYLENOL) 325 MG tablet Take 650 mg by mouth every 6 (six) hours as needed for headache.  Marland Kitchen amLODipine (NORVASC) 2.5 MG tablet Take 2.5 mg by mouth daily.   . Calcium-Phosphorus-Vitamin D (CITRACAL CALCIUM GUMMIES PO) Take by mouth. 1 gummy given daily.  . carvedilol (COREG) 6.25 MG tablet Take 1 tablet (6.25 mg total) by mouth 2 (two) times daily with a meal.  . donepezil (ARICEPT) 10 MG tablet Take 10 mg by mouth at bedtime.   . feeding supplement (BOOST / RESOURCE BREEZE) LIQD Take 1 Container by mouth 2 (two) times daily between meals.  Marland Kitchen levothyroxine (SYNTHROID, LEVOTHROID) 50 MCG tablet Take 50 mcg by mouth See admin instructions. Only on Tuesday, Thursday, Saturday, Sunday.  . levothyroxine (SYNTHROID, LEVOTHROID) 75 MCG tablet Take 75 mcg  by mouth. Take one tablet Monday and Friday  . lisinopril (PRINIVIL,ZESTRIL) 10 MG tablet Take 10 mg by mouth daily.  Marland Kitchen triamterene-hydrochlorothiazide (MAXZIDE-25) 37.5-25 MG tablet Take 0.5 tablets by mouth daily.    No facility-administered encounter medications on file as of 06/07/2017.    ROS was provided with assistance of staff.  Review of Systems  Constitutional: Negative for activity change, appetite change, chills, diaphoresis, fatigue and fever.  HENT: Positive for hearing loss.  Negative for congestion and voice change.   Respiratory: Negative for cough and shortness of breath.   Cardiovascular: Negative for chest pain, palpitations and leg swelling.  Genitourinary: Negative for difficulty urinating and dysuria.  Neurological: Negative for dizziness, speech difficulty and weakness.  Psychiatric/Behavioral: Positive for agitation, behavioral problems and confusion. Negative for hallucinations and sleep disturbance. The patient is not nervous/anxious.     Immunization History  Administered Date(s) Administered  . PPD Test 09/05/2016   Pertinent  Health Maintenance Due  Topic Date Due  . DEXA SCAN  04/28/1992  . PNA vac Low Risk Adult (1 of 2 - PCV13) 04/28/1992  . INFLUENZA VACCINE  04/28/2017   Fall Risk  01/28/2016  Falls in the past year? No   Functional Status Survey:    Vitals:   06/07/17 1244  BP: 130/80  Pulse: 76  Resp: 18  Temp: 97.6 F (36.4 C)  Weight: 122 lb (55.3 kg)  Height: 5\' 5"  (1.651 m)   Body mass index is 20.3 kg/m. Physical Exam  Constitutional: She appears well-developed and well-nourished. No distress.  Cardiovascular: Normal rate, regular rhythm and normal heart sounds.   No murmur heard. Pulmonary/Chest: Effort normal and breath sounds normal. She has no wheezes. She has no rales.  Musculoskeletal: Normal range of motion. She exhibits no edema.  Neurological: She is alert. She exhibits normal muscle tone. Coordination normal.  Oriented to self and her room  Skin: She is not diaphoretic.  Psychiatric:  Dementia, irritable.     Labs reviewed:  Recent Labs  11/15/16 1335 11/16/16 0512 11/17/16 0550 12/08/16 05/27/17  NA 135 138 136 136* 137  K 3.5 3.5 3.1* 4.5 4.2  CL 97* 103 100*  --   --   CO2 28 26 27   --   --   GLUCOSE 102* 93 108*  --   --   BUN 18 18 18  27* 29*  CREATININE 0.89 0.84 0.80  --  1.0  CALCIUM 9.4 9.2 9.3  --   --     Recent Labs  11/10/16 11/15/16 1335 05/27/17  AST 17 23 17   ALT 13 18  12   ALKPHOS 79 71 59  BILITOT  --  0.9  --   PROT  --  6.7  --   ALBUMIN  --  4.1  --     Recent Labs  11/15/16 1335 11/16/16 0512 11/17/16 0550 05/27/17  WBC 9.9 11.0* 10.7* 8.1  NEUTROABS 7.3  --   --   --   HGB 12.2 12.0 11.4* 11.6*  HCT 37.3 37.3 35.2* 35*  MCV 91.2 92.3 91.2  --   PLT 265 260 252 250   Lab Results  Component Value Date   TSH 4.43 05/27/2017   Lab Results  Component Value Date   HGBA1C 5.7 11/10/2016   Lab Results  Component Value Date   CHOL 200 11/10/2016   HDL 63 11/10/2016   LDLCALC 121 11/10/2016   TRIG 81 11/10/2016    Significant Diagnostic Results in  last 30 days:  No results found.  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   Dementia She is more agitated and angry, her confusion is at her baseline, continue Aricept, update TSH 4.43 05/27/17  Hypothyroidism Controlled, continue Levothyroxine 60mcg po daily, last TSH 4.43 05/27/17     Family/ staff Communication: plan of care reviewed with the patient and charge nurse.   Labs/tests ordered:  none  Time spend 25 minutes.

## 2017-06-07 NOTE — Assessment & Plan Note (Signed)
Will try Sertraline 25mg  po daily, observe the patient.

## 2017-06-10 ENCOUNTER — Encounter: Payer: Self-pay | Admitting: Neurology

## 2017-06-10 ENCOUNTER — Ambulatory Visit (INDEPENDENT_AMBULATORY_CARE_PROVIDER_SITE_OTHER): Payer: Medicare Other | Admitting: Neurology

## 2017-06-10 VITALS — BP 148/63 | HR 79 | Ht 65.0 in | Wt 122.0 lb

## 2017-06-10 DIAGNOSIS — G301 Alzheimer's disease with late onset: Secondary | ICD-10-CM | POA: Diagnosis not present

## 2017-06-10 DIAGNOSIS — F0280 Dementia in other diseases classified elsewhere without behavioral disturbance: Secondary | ICD-10-CM

## 2017-06-10 MED ORDER — MEMANTINE HCL 5 MG PO TABS: 5.0000 mg | ORAL_TABLET | Freq: Every day | ORAL | 5 refills | Status: DC

## 2017-06-10 NOTE — Progress Notes (Signed)
Subjective:    Patient ID: Jamie Thompson is a 83 y.o. female.  HPI     Interim history:   Jamie Thompson is an 83 year old right-handed woman with an underlying medical history of A. fib, hypertension, hyperlipidemia, Mnire's disease with bilateral hearing loss, anxiety, hypothyroidism, diverticulosis, status post right cataract extraction, allergic rhinitis, and vitamin D deficiency, who presents for follow-up consultation of her memory loss. The patient is accompanied by her son-in-law again today. I last saw her on 07/30/2016, at which time she was doing okay. She was transitioning to assisted living. She had been hospitalized in September 2017 for shortness of breath and chest pain. Acute coronary was excluded at the time and she was diagnosed with UTI. I suggested we continue her memory medication. She saw Jamie Thompson in the interim on 12/08/2016, at which time her MMSE was 22 out of 30. She was advised to continue with Aricept and consider adding Namenda but she and her son-in-law wanted to wait it out some until next appointment.  Today, 06/10/2017 (all dictated new, as well as above notes, some dictation done in note pad or Word, outside of chart, may appear as copied):  She reports doing okay. Her Son-in-law reports recent increase in memory dysfunction, confusion, agitation, low frustration tolerance and even some aggressive behavior in that she had slammed doors. The patient does seem a little bit agitated today. She was recently started on low-dose sertraline 25 mg once daily. She continues to take donepezil 10 mg daily. No recent falls, fell in Feb 2018 and sustained left-sided rib fractures unfortunately.   Previously (copied from previous notes for reference):   I saw her on 01/28/2016, at which time her son-in-law noted more confusion, gradually with progression. I initiated Aricept low-dose generic. She was then seen in the interim by our nurse practitioner, [name] on  04/07/2016, at which time her MMSE was 27 and donepezil was increased to 10 mg once daily. I reviewed the neuropsychological test results from 01/27/2016, when she saw Dr. Valentina Thompson:  Diagnostic Impression Mild Cognitive impairment, amnestic type, multiple domains [G31.84]   Recommendations 1.     Assuming continued cognitive decline, it may be necessary for her to move into assisted living within the coming year. Family should have regular contact with staff at her independent living center to help make this decision.    2.     Given her memory loss, she requires oversight of medication administration and bill paying. Her son-in-law stated that he is available to do this on a daily basis.   3.         It is recommended that she undergo a repeat neuropsychological evaluation in about one year using these test results as a baseline to track for interval changes, if any, in her cognitive functioning.     I saw her on 10/30/2015 at which time she reported for a sooner than scheduled appointment because of worsening memory issues noted by her other daughter who had recently visited the patient. She had more forgetfulness, lumbar mood irritability, does not always drinking enough water. She was living in independent living but there have been some instances where the staff had to redirect her to her own apartment. She had a near fall or controlled fall rather about 3 weeks prior. She had seen her primary care physician and had blood work which per son-in-law was fine including urinalysis. Her MMSE was 29/30, CDT: 4/4, AFT: 12/min. I suggested we proceed with formal neuropsychological  testing. She had this evaluation with Dr. Valentina Thompson on 01/27/2016.    She presents for a sooner than scheduled appointment because of worsening memory loss. I last saw her on 07/01/2015, at which time she reported doing well. Her son-in-law had observed her driving and was satisfied with her driving. She was mildly forgetful, she had  not fallen. She had a good appetite. Her MMSE was 27 out of 30 at the time, clock drawing was 3 out of 4, animal fluency 10/m. We mutually agreed to monitor her symptoms and not start her on medication for memory loss at the time.   I first met her on 02/26/2015 at the request of her primary care physician, at which time she reported a several month history of short-term memory loss. Her MMSE was 24 out of 30, clock drawing was 3 out of 4, animal fluency was 9/m, depression score was 0 out of 15 at the time of her first visit with me. She had had a prior brain MRI several years ago and I suggested we proceed with another MRI to compare findings. I did not suggest any new medications and she recently had had blood work through her primary care physician. We talked about her driving and I asked her daughter to observe her driving. I suggested the patient stay on local roads, avoid highways and Interstates and avoid driving at night. She had a brain MRI without contrast on 03/09/2015: Ordinary age related volume loss. Minimal small vessel change of the cerebral hemispheric white matter, less than often seen in healthy individuals of this age. No acute or reversible finding. No specific cause of the presenting symptoms is identified. Since the previous study, atrophy is slightly progressive as would be expected.   Findings were compared to a previous brain MRI from 03/02/2005. In addition, I personally reviewed the images through the PACS system. We called her with her test results   02/26/2015: She reports short-term memory issues for the past few months. She reported some forgetfulness, misplacing things, and forgetting conversations. She still drives. She drives locally for the most part. She has not had any issues driving but her daughter has not recently observed her driving. Per daughter, she has had a 6-9 month history of forgetfulness, misplacing things, forgetting events or conversations. Familiar faces  are easily recognized. She has no evidence of paranoia, no other delusions, no delusions, no hallucinations, and no personality changes or behavioral changes. [facility]. She has 2 daughters, [name] his local and her other daughter, [name], is in [place], [place]. She does not have a strong family history of dementia but a maternal aunt had Alzheimer's disease. Her mother lived to be 69 years old, her father died at the age of 65. She is a lifelong nonsmoker and does not currently drink any alcohol. She has never had any heavy alcohol use. She does not use illicit drugs. She sleeps fairly well. She has not had any confusion or disorientation. She tries to walk regularly. She goes to the dining room once a day for lunch. She eats fairly well. I reviewed your office note from 02/12/2015 which you kindly included. Recent blood work from 02/06/2015 showed normal liver function, cholesterol of 148, triglycerides of 57, LDL of 84. She had a brain MRI with and without contrast on 03/02/2005: 1.  No internal auditory canal enhancing lesion or acute infarct. 2.  Mild age related atrophy.  Mild nonspecific white matter changes may be related  to sequela of small vessel disease. 3.  Cervical spondylotic changes with spinal stenosis C3-4 and C4-5 incompletely evaluated on the present exam.   In addition, I personally reviewed the images through the PACS system.   Her Past Medical History Is Significant For: Past Medical History:  Diagnosis Date  . Adenomatous colon polyp 07/17/97  . Allergic rhinitis   . Allergy   . Anxiety disorder   . Atrial fibrillation ([place])   . Chronic anticoagulation   . Diverticulosis   . History of shingles   . Hypercholesteremia   . Hypertension   . Hypothyroidism   . Internal hemorrhoids   . Melanoma ([place])   . Memory loss   . Meniere disease   . Tension headache     Her Past Surgical History Is Significant For: Past Surgical History:   Procedure Laterality Date  . CATARACT EXTRACTION Right 2012    Her Family History Is Significant For: Family History  Problem Relation Age of Onset  . Stroke Father   . Clotting disorder Father   . Colon cancer Mother   . Colon cancer Sister   . Clotting disorder Sister     Her Social History Is Significant For: Social History   Social History  . Marital status: Single    Spouse name: N/A  . Number of children: 2  . Years of education: 39yrcolg   Occupational History  . Retired rScientist, research (medical)   Social History Main Topics  . Smoking status: Never Smoker  . Smokeless tobacco: Never Used  . Alcohol use No  . Drug use: No  . Sexual activity: No   Other Topics Concern  . None   Social History Narrative   1 cup of coffee a day, occasionally drinks tea or soda   Social History     Marital status: Widowed             Spouse name:                        Years of education:        Number of children: 2               Occupational History   Occupation : SSurveyor, mining           Comment                Retired                                       Social History Main Topics     Smoking status: Never Smoker                                                                  Smokeless tobacco: Never Used                         Alcohol use: No               Drug use: No  Sexual activity: Not on file            Other Topics            Concern     None on file      Social History Narrative     1 cup of coffee a day, occasionally drinks tea or soda.   She does not exercise, has a living will, DNR and POA   Lives at [facility]             Her Allergies Are:  Allergies  Allergen Reactions  . Amitriptyline     Urinary Retention  . Amlodipine Swelling  . Erythromycin Hives  . Nitrofurantoin Nausea Only  . Sulfa Antibiotics     "really ill"  . Tavist [Clemastine]     Urinary hesitancy  . Trimethoprim     Cramps  . Verapamil      Constipation  :   Her Current Medications Are:  Outpatient Encounter Prescriptions as of 06/10/2017  Medication Sig  . acetaminophen (TYLENOL) 325 MG tablet Take 650 mg by mouth every 6 (six) hours as needed for headache.  Marland Kitchen amLODipine (NORVASC) 2.5 MG tablet Take 2.5 mg by mouth daily.   . Calcium-Phosphorus-Vitamin D (CITRACAL CALCIUM GUMMIES PO) Take by mouth. 1 gummy given daily.  . carvedilol (COREG) 6.25 MG tablet Take 1 tablet (6.25 mg total) by mouth 2 (two) times daily with a meal.  . donepezil (ARICEPT) 10 MG tablet Take 10 mg by mouth at bedtime.   . feeding supplement (BOOST / RESOURCE BREEZE) LIQD Take 1 Container by mouth 2 (two) times daily between meals.  Marland Kitchen levothyroxine (SYNTHROID, LEVOTHROID) 50 MCG tablet Take 50 mcg by mouth See admin instructions. Only on Tuesday, Thursday, Saturday, Sunday.  . levothyroxine (SYNTHROID, LEVOTHROID) 75 MCG tablet Take 75 mcg by mouth. Take one tablet Monday and Friday  . lisinopril (PRINIVIL,ZESTRIL) 10 MG tablet Take 10 mg by mouth daily.  . sertraline (ZOLOFT) 25 MG tablet Take 25 mg by mouth daily.  Marland Kitchen triamterene-hydrochlorothiazide (MAXZIDE-25) 37.5-25 MG tablet Take 0.5 tablets by mouth daily.    No facility-administered encounter medications on file as of 06/10/2017.   :  Review of Systems:  Out of a complete 14 point review of systems, all are reviewed and negative with the exception of these symptoms as listed below:  Review of Systems  Neurological:       Pt presents today to discuss her memory. [facility] at [facility].    Objective:  Neurological Exam  Physical Exam Physical Examination:   Vitals:   06/10/17 1125  BP: (!) 148/63  Pulse: 79    General Examination: The patient is a very pleasant 83 y.o. female in no acute distress. She appears well-developed and well-nourished and well groomed.   HEENT: Normocephalic, atraumatic, pupils are equal, round and reactive to light and accommodation. Extraocular  tracking shows no saccadic breakdown without nystagmus noted. Hearing is impaired with no hearing aids in place today. Face is symmetric with no facial masking and normal facial sensation. There is no lip, neck or jaw tremor. Neck is not rigid with intact passive ROM. There are no carotid bruits on auscultation. Oropharynx exam reveals mild mouth dryness. No significant airway crowding is noted. Mallampati is class II. Tongue protrudes centrally and palate elevates symmetrically.    Chest: is clear to auscultation without wheezing, rhonchi or crackles noted.  Heart: sounds are regular and normal without murmurs, rubs  or gallops noted.   Abdomen: is soft, non-tender and non-distended with normal bowel sounds appreciated on auscultation.  Extremities: There is no pitting edema in the distal lower extremities bilaterally. Pedal pulses are intact.   Skin: is warm and dry with no trophic changes noted. Age-related changes are noted on the skin, skin turgor mildly reduced.   Musculoskeletal: exam reveals no obvious joint deformities, tenderness or joint swelling or erythema. Mild changes consistent with OA of the hands are noted bilaterally. All stable.   Neurologically: Mental status: The patient is awake and alert, paying fair attention. She is able to provide a very little portion of the history. Her history is provided by her son-in-law, [name]. Her memory, attention, language and knowledge are impaired, worse. Mood is a little irritable, affect is fluctuating.    On 02/26/2015: Her MMSE (Mini-Mental state exam) score is 24/30. CDT (Clock Drawing Test) score is 3/4. AFT (Animal Fluency Test) score is 9. Geriatric Depression Scale Score is 0.   On 07/01/2015: MMSE: 27/30, CDT: 3/4, AFT: 10/min.  On 10/30/2015: MMSE: 29/30, CDT: 4/4, AFT: 12/min.  On 04/07/16: MMSE: 27/30.  On 12/08/16: MMSE: 22/30.  On 06/10/2017: MMSE: 21/30, 2/4, AFT: 5/min.   Cranial nerves are as described above under  HEENT exam.   Motor exam:  thin bulk, global strength normal for age. Tone is normal. Gross motor and fine motor generally intact, reflexes 1+.   Sensory exam is intact to light touch in the upper and lower extremities.   Gait, station and balance: She stands up from the seated position with no difficulty and needs no assistance. Posture is age-appropriate. Stance is slightly cautious and slow. Tandem walk is not possible for her.  Assessment and Plan:   In summary, Jamie Thompson is a very pleasant 83 year old female with an underlying medical history of A. Fib on coumadin, hypertension, hyperlipidemia, Mnire's disease with bilateral hearing loss and b/l hearing aids in place, anxiety, hypothyroidism, diverticulosis, status post right cataract extraction, allergic rhinitis, and vitamin D deficiency, who presents for follow-up consultation of her dementia. She had neuropsychological testing on 01/27/2016 with results in keeping with mild cognitive impairment, concern for early Alzheimer's disease. She was started on generic Aricept 5 mg once daily in May 2017 and this was increased to 10 mg once daily in July 2017 when she saw our nurse practitioner. She has been able to tolerate this. She transitioning from independent living to assisted living in 12/[facility]. She has a good support system.  recently, she has had more mood irritability and some anger outbursts as I understand, lower frustration tolerance and agitation, she was started on low-dose Zoloft generic by her primary care provider. I suggested that we start her on a second debridement medication, namely Namenda generic 5 mg strength low-dose once daily. I would like to be cautious with medication increases in order to avoid side effects given her advanced age. Brain MRI from 03/09/2015 showed age-related findings and mild progression of her atrophy compared to an MRI from 2006. I suggested we continue with Aricept generic, 10 mg  strength once daily as well. I talked to her and her son-in-law at length again today. They are advised to follow up in 4 months with Jinny Blossom, NP, at which time we can try to increase the generic Namenda to 5 mg twice a day. I will see her back after that.  I answered all her questions today and the patient and her son-in-law were  in agreement.  I spent 25 minutes in total face-to-face time with the patient, more than 50% of which was spent in counseling and coordination of care, reviewing test results, reviewing medication and discussing or reviewing the diagnosis of dementia, its prognosis and treatment options. Pertinent laboratory and imaging test results that were available during this visit with the patient were reviewed by me and considered in my medical decision making (see chart for details).

## 2017-06-10 NOTE — Patient Instructions (Signed)
We will continue with the donepezil 10 mg each evening.   We will start a second memory medication: Namenda generic 5 mg once daily. Side effects include: nausea, confusion, hallucination, personality changes. If you are having mild side effects, try to stick with the treatment as these initial side effects may go away after the first 10-14 days.     We will do a follow up in 4 months with Ward Givens, Nurse Practitioner, to see if we can increase the Namenda to 2 times a day.

## 2017-06-25 ENCOUNTER — Encounter: Payer: Self-pay | Admitting: Internal Medicine

## 2017-06-25 ENCOUNTER — Non-Acute Institutional Stay: Payer: Medicare Other | Admitting: Internal Medicine

## 2017-06-25 DIAGNOSIS — F22 Delusional disorders: Secondary | ICD-10-CM

## 2017-06-25 DIAGNOSIS — F0281 Dementia in other diseases classified elsewhere with behavioral disturbance: Secondary | ICD-10-CM | POA: Diagnosis not present

## 2017-06-25 DIAGNOSIS — G309 Alzheimer's disease, unspecified: Secondary | ICD-10-CM | POA: Diagnosis not present

## 2017-06-25 NOTE — Progress Notes (Signed)
Provider: Blanchie Serve MD   Location:  [facility] Room Number: 907 Place of Service:  ALF ([phone])  PCP: Blanchie Serve, MD Patient Care Team: Blanchie Serve, MD as PCP - General (Internal Medicine) Mast, Man X, NP as Nurse Practitioner (Internal Medicine) [name], MD as Attending Physician (Neurology)  Extended Emergency Contact Information Primary Emergency Contact: Key,Caroline Address: [place] Phone: [phone] Mobile Phone: [phone] Relation: Daughter Secondary Emergency Contact: Delrae Rend [place] Mobile Phone: [phone] Relation: Son   Goals of Care: Advanced Directive information Advanced Directives 06/25/2017  Does Patient Have a Medical Advance Directive? Yes  Type of Advance Directive Living will;Out of facility DNR (pink MOST or yellow form);Healthcare Power of Attorney  Does patient want to make changes to medical advance directive? No - Patient declined  Copy of [place] in Chart? Yes  Would patient like information on creating a medical advance directive? -  Pre-existing out of facility DNR order (yellow form or pink MOST form) Yellow form placed in chart (order not valid for inpatient use)      Chief Complaint  Patient presents with  . Acute Visit    Behavioral changes    HPI: Patient is a 83 y.o. female seen today for acute visit. Per nursing, patient has had paranoid behavior for few days. She stays in her room and has refused to go to dining area for meals. Reported on 06/23/17 that patient felt she was being set up and her life is in danger. Also that night, she was hiding in bathroom with light off thinking someone was out to get her. Of note she had some confusion last week as well where she thought her kids are dead. On med review, she was started on sertraline on 06/07/17 for behavioral issues. She has dementia  and is currently taking donepezil 10 mg daily. She is seen in her room today. She is pleasantly participating in conversation until the end where she gets agitated and sits up in bed. She mentions this is a phase and she will feel better. She repeats some questions but is redirectable.   Past Medical History:  Diagnosis Date  . Adenomatous colon polyp 07/17/97  . Allergic rhinitis   . Allergy   . Anxiety disorder   . Atrial fibrillation ([place])   . Chronic anticoagulation   . Diverticulosis   . History of shingles   . Hypercholesteremia   . Hypertension   . Hypothyroidism   . Internal hemorrhoids   . Melanoma ([place])   . Memory loss   . Meniere disease   . Tension headache    Past Surgical History:  Procedure Laterality Date  . CATARACT EXTRACTION Right 2012    reports that she has never smoked. She has never used smokeless tobacco. She reports that she does not drink alcohol or use drugs. Social History   Social History  . Marital status: Single    Spouse name: N/A  . Number of children: 2  . Years of education: 48yr colg   Occupational History  . Retired Scientist, research (medical)    Social History Main Topics  . Smoking status: Never Smoker  . Smokeless tobacco: Never Used  . Alcohol use No  . Drug use: No  . Sexual activity: No   Other Topics Concern  . Not on file   Social  History Narrative   1 cup of coffee a day, occasionally drinks tea or soda   Social History     Marital status: Widowed             Spouse name:                        Years of education:        Number of children: 2               Occupational History   Occupation : Surveyor, mining            Comment                Retired                                       Social History Main Topics     Smoking status: Never Smoker                                                                  Smokeless tobacco: Never Used                         Alcohol use: No               Drug use: No                Sexual activity: Not on file            Other Topics            Concern     None on file      Social History Narrative     1 cup of coffee a day, occasionally drinks tea or soda.   She does not exercise, has a living will, DNR and POA   Lives at [facility]              Family History  Problem Relation Age of Onset  . Stroke Father   . Clotting disorder Father   . Colon cancer Mother   . Colon cancer Sister   . Clotting disorder Sister     Health Maintenance  Topic Date Due  . Samul Dada  04/28/1944  . DEXA SCAN  04/28/1992  . INFLUENZA VACCINE  06/28/2017 (Originally 04/28/2017)  . PNA vac Low Risk Adult (1 of 2 - PCV13) 07/29/2017 (Originally 04/28/1992)    Allergies  Allergen Reactions  . Amitriptyline     Urinary Retention  . Amlodipine Swelling  . Erythromycin Hives  . Nitrofurantoin Nausea Only  . Sulfa Antibiotics     "really ill"  . Tavist [Clemastine]     Urinary hesitancy  . Trimethoprim     Cramps  . Verapamil     Constipation    Outpatient Encounter Prescriptions as of 06/25/2017  Medication Sig  . acetaminophen (TYLENOL) 325 MG tablet Take 650 mg by mouth every 6 (six) hours as needed for headache.  Marland Kitchen amLODipine (NORVASC) 2.5 MG tablet Take 2.5 mg by mouth daily.   Marland Kitchen  Calcium-Phosphorus-Vitamin D (CITRACAL CALCIUM GUMMIES PO) Take by mouth. 1 gummy given daily.  . carvedilol (COREG) 6.25 MG tablet Take 1 tablet (6.25 mg total) by mouth 2 (two) times daily with a meal.  . donepezil (ARICEPT) 10 MG tablet Take 10 mg by mouth at bedtime.   . feeding supplement (BOOST / RESOURCE BREEZE) LIQD Take 1 Container by mouth 2 (two) times daily between meals.  Marland Kitchen levothyroxine (SYNTHROID, LEVOTHROID) 50 MCG tablet Take 50 mcg by mouth See admin instructions. Only on Tuesday, Thursday, Saturday, Sunday.  . levothyroxine (SYNTHROID, LEVOTHROID) 75 MCG tablet Take 75 mcg by mouth. Take one tablet Monday and Friday  . lisinopril (PRINIVIL,ZESTRIL) 10 MG  tablet Take 10 mg by mouth daily.  . memantine (NAMENDA) 5 MG tablet Take 1 tablet (5 mg total) by mouth daily.  . sertraline (ZOLOFT) 25 MG tablet Take 25 mg by mouth daily.  Marland Kitchen triamterene-hydrochlorothiazide (MAXZIDE-25) 37.5-25 MG tablet Take 0.5 tablets by mouth daily.    No facility-administered encounter medications on file as of 06/25/2017.     Review of Systems  Constitutional: Negative for appetite change and chills.  HENT: Positive for hearing loss. Negative for congestion, mouth sores, rhinorrhea, sinus pressure and trouble swallowing.   Respiratory: Negative for cough and shortness of breath.   Cardiovascular: Negative for chest pain and palpitations.  Gastrointestinal: Negative for abdominal pain, diarrhea, nausea and vomiting.  Genitourinary: Negative for dysuria, frequency, hematuria and pelvic pain.  Musculoskeletal: Negative for arthralgias.  Skin: Negative for rash.  Psychiatric/Behavioral: Positive for behavioral problems and confusion.    Vitals:   06/25/17 1047  BP: 110/68  Pulse: 76  Resp: 18  Temp: (!) 97.5 F (36.4 C)  TempSrc: Oral  Weight: 122 lb (55.3 kg)  Height: 5\' 5"  (1.651 m)   Body mass index is 20.3 kg/m.   Wt Readings from Last 3 Encounters:  06/25/17 122 lb (55.3 kg)  06/10/17 122 lb (55.3 kg)  06/07/17 122 lb (55.3 kg)   Physical Exam  Constitutional: She is oriented to person, place, and time. She appears well-developed and well-nourished. No distress.  HENT:  Head: Normocephalic and atraumatic.  Mouth/Throat: Oropharynx is clear and moist.  Eyes: Pupils are equal, round, and reactive to light. Conjunctivae are normal.  Neck: Neck supple.  Cardiovascular: Normal rate and regular rhythm.   Pulmonary/Chest: Effort normal and breath sounds normal. No respiratory distress. She has no wheezes. She has no rales.  Abdominal: Soft. Bowel sounds are normal. She exhibits no distension. There is no tenderness. There is no guarding.    Musculoskeletal: She exhibits no edema.  Lymphadenopathy:    She has no cervical adenopathy.  Neurological: She is alert and oriented to person, place, and time.  Skin: Skin is warm and dry. She is not diaphoretic. No erythema.  Psychiatric:  Paranoid, tearful, repeat questions, appears scared     Labs reviewed: Basic Metabolic Panel:  Recent Labs  11/15/16 1335 11/16/16 0512 11/17/16 0550 12/08/16 05/27/17  NA 135 138 136 136* 137  K 3.5 3.5 3.1* 4.5 4.2  CL 97* 103 100*  --   --   CO2 28 26 27   --   --   GLUCOSE 102* 93 108*  --   --   BUN 18 18 18  27* 29*  CREATININE 0.89 0.84 0.80  --  1.0  CALCIUM 9.4 9.2 9.3  --   --    Liver Function Tests:  Recent Labs  11/10/16 11/15/16 1335 05/27/17  AST 17 23 17   ALT 13 18 12   ALKPHOS 79 71 59  BILITOT  --  0.9  --   PROT  --  6.7  --   ALBUMIN  --  4.1  --     Recent Labs  11/15/16 1335  LIPASE 33   No results for input(s): AMMONIA in the last 8760 hours. CBC:  Recent Labs  11/15/16 1335 11/16/16 0512 11/17/16 0550 05/27/17  WBC 9.9 11.0* 10.7* 8.1  NEUTROABS 7.3  --   --   --   HGB 12.2 12.0 11.4* 11.6*  HCT 37.3 37.3 35.2* 35*  MCV 91.2 92.3 91.2  --   PLT 265 260 252 250   Cardiac Enzymes:  Recent Labs  11/15/16 1616 11/15/16 2132  TROPONINI <0.03 <0.03   BNP: Invalid input(s): POCBNP Lab Results  Component Value Date   HGBA1C 5.7 11/10/2016   Lab Results  Component Value Date   TSH 4.43 05/27/2017   No results found for: VITAMINB12 No results found for: FOLATE No results found for: IRON, TIBC, FERRITIN  Lipid Panel:  Recent Labs  11/10/16  CHOL 200  HDL 63  LDLCALC 121  TRIG 81   Lab Results  Component Value Date   HGBA1C 5.7 11/10/2016    Procedures since last visit: No results found.  Assessment/Plan  Paranoid behavior Newly started on sertraline 25 mg daily. Has been on it for about 2 weeks. Discontinue this for now. With her dementia, start low dose seroquel and  monitor. Start seroquel 12.5 mg daily in the evening. Check cbc with diff and lytes to rule out infection and metabolic etiology for behavioral changes. Redirection and supportive care.  Dementia with behavior disturbance Continue aricept. Start low dose seroquel as above.     Labs/tests ordered:  none  Communication: reviewed care plan with patient and charge nurse.     Blanchie Serve, MD Internal Medicine [facility] [address] [place], [place] [address] Cell Phone (Monday-Friday 8 am - 5 pm): [phone] On Call: [phone] and follow prompts after 5 pm and on weekends Office Phone: [phone] Office Fax: [phone]

## 2017-06-29 DIAGNOSIS — I10 Essential (primary) hypertension: Secondary | ICD-10-CM | POA: Diagnosis not present

## 2017-06-29 LAB — BASIC METABOLIC PANEL
BUN: 33 — AB (ref 4–21)
CREATININE: 1.1 (ref 0.5–1.1)
Glucose: 106
Potassium: 4.7 (ref 3.4–5.3)
SODIUM: 137 (ref 137–147)

## 2017-06-29 LAB — CBC AND DIFFERENTIAL
HEMATOCRIT: 38 (ref 36–46)
Hemoglobin: 12.7 (ref 12.0–16.0)
Platelets: 265 (ref 150–399)
WBC: 7.6

## 2017-06-29 LAB — HEPATIC FUNCTION PANEL
ALK PHOS: 82 (ref 25–125)
ALT: 29 (ref 7–35)
AST: 24 (ref 13–35)
Bilirubin, Total: 0.4

## 2017-07-01 DIAGNOSIS — F0281 Dementia in other diseases classified elsewhere with behavioral disturbance: Secondary | ICD-10-CM | POA: Diagnosis not present

## 2017-07-12 ENCOUNTER — Encounter: Payer: Self-pay | Admitting: Nurse Practitioner

## 2017-07-12 ENCOUNTER — Non-Acute Institutional Stay: Payer: Medicare Other | Admitting: Nurse Practitioner

## 2017-07-12 DIAGNOSIS — F329 Major depressive disorder, single episode, unspecified: Secondary | ICD-10-CM

## 2017-07-12 DIAGNOSIS — W19XXXA Unspecified fall, initial encounter: Secondary | ICD-10-CM | POA: Diagnosis not present

## 2017-07-12 DIAGNOSIS — F0280 Dementia in other diseases classified elsewhere without behavioral disturbance: Secondary | ICD-10-CM | POA: Diagnosis not present

## 2017-07-12 DIAGNOSIS — G309 Alzheimer's disease, unspecified: Secondary | ICD-10-CM

## 2017-07-12 DIAGNOSIS — F0283 Dementia in other diseases classified elsewhere, unspecified severity, with mood disturbance: Secondary | ICD-10-CM

## 2017-07-12 NOTE — Assessment & Plan Note (Signed)
fall 07/10/17, the resident reported to med tech that she was sitting in her chair, heard a beeping noise, jumped up out of chair, feet went under her and she feel to floor,  she stated fall occurred when she lost balance, denied dizziness, chest pain, palpitation, focal weakness associated with the fall. She denied pain, aches, or change of ROM from the event. She stated she sleeps and eats well, no fever, chills, dysuria, nausea, vomiting, constipation.  Close supervision for safety since the patient is lacking of safety awareness related to her dementia, may x-ray if pain is complained.

## 2017-07-12 NOTE — Progress Notes (Signed)
Location:  [facility] Room Number: 907 Place of Service:  ALF [phone]) Provider:  Mariaceleste Herrera, Manxie  NP  Blanchie Serve, MD  Patient Care Team: Blanchie Serve, MD as PCP - General (Internal Medicine) Allissa Albright X, NP as Nurse Practitioner (Internal Medicine) [name], MD as Attending Physician (Neurology)  Extended Emergency Contact Information Primary Emergency Contact: Key,Caroline Address: [place] Phone: [phone] Mobile Phone: [phone] Relation: Daughter Secondary Emergency Contact: Key,Tim  [place] Mobile Phone: [phone] Relation: Son  Code Status:  DNR Goals of care: Advanced Directive information Advanced Directives 07/12/2017  Does Patient Have a Medical Advance Directive? Yes  Type of Advance Directive Living will;Healthcare Power of [place];Out of facility DNR (pink MOST or yellow form)  Does patient want to make changes to medical advance directive? No - Patient declined  Copy of [place] in Chart? Yes  Would patient like information on creating a medical advance directive? -  Pre-existing out of facility DNR order (yellow form or pink MOST form) Yellow form placed in chart (order not valid for inpatient use)     Chief Complaint  Patient presents with  . Acute Visit    Right lateral side and (R) leg    HPI:  Pt is a 83 y.o. female seen today for an acute visit for fall 07/10/17, she stated fall occurred when she lost balance, denied dizziness, chest pain, palpitation, focal weakness associated with the fall. She denied pain, aches, or change of ROM from the event. She stated she sleeps and eats well, no fever, chills, dysuria, nausea, vomiting, constipation.   The patient has behavioral issues, angry, emotional outburst, failed Sertraline, started Seroquel 12.5mg  since 06/25/17, positive responses seen. Her dementia, is managed  Namenda, resides in [place] for care, she is lacking of safety awareness at times.    Past Medical History:  Diagnosis Date  . Adenomatous colon polyp 07/17/97  . Allergic rhinitis   . Allergy   . Anxiety disorder   . Atrial fibrillation ([place])   . Chronic anticoagulation   . Diverticulosis   . History of shingles   . Hypercholesteremia   . Hypertension   . Hypothyroidism   . Internal hemorrhoids   . Melanoma ([place])   . Memory loss   . Meniere disease   . Tension headache    Past Surgical History:  Procedure Laterality Date  . CATARACT EXTRACTION Right 2012    Allergies  Allergen Reactions  . Amitriptyline     Urinary Retention  . Amlodipine Swelling  . Erythromycin Hives  . Nitrofurantoin Nausea Only  . Sulfa Antibiotics     "really ill"  . Tavist [Clemastine]     Urinary hesitancy  . Trimethoprim     Cramps  . Verapamil     Constipation    Outpatient Encounter Prescriptions as of 07/12/2017  Medication Sig  . acetaminophen (TYLENOL) 325 MG tablet Take 650 mg by mouth every 6 (six) hours as needed for headache.  Marland Kitchen amLODipine (NORVASC) 2.5 MG tablet Take 2.5 mg by mouth daily.   . Calcium-Phosphorus-Vitamin D (CITRACAL CALCIUM GUMMIES PO) Take by mouth. 1 gummy given daily.  . carvedilol (COREG) 6.25 MG tablet Take 1 tablet (6.25 mg total) by mouth 2 (two) times daily with a meal.  . donepezil (ARICEPT) 10 MG tablet Take 10 mg by mouth at bedtime.   . feeding  supplement (BOOST / RESOURCE BREEZE) LIQD Take 1 Container by mouth 2 (two) times daily between meals.  Marland Kitchen levothyroxine (SYNTHROID, LEVOTHROID) 50 MCG tablet Take 50 mcg by mouth See admin instructions. Only on Tuesday, Thursday, Saturday, Sunday.  . levothyroxine (SYNTHROID, LEVOTHROID) 75 MCG tablet Take 75 mcg by mouth. Take one tablet Monday and Friday  . lisinopril (PRINIVIL,ZESTRIL) 10 MG tablet Take 10 mg by mouth daily.  . memantine (NAMENDA) 5 MG tablet Take 1 tablet (5 mg total) by mouth daily.  .  QUEtiapine (SEROQUEL) 25 MG tablet Take 25 mg by mouth daily. At 5 pm  . triamterene-hydrochlorothiazide (MAXZIDE-25) 37.5-25 MG tablet Take 0.5 tablets by mouth daily.   . [DISCONTINUED] sertraline (ZOLOFT) 25 MG tablet Take 25 mg by mouth daily.   No facility-administered encounter medications on file as of 07/12/2017.    ROS was provided with assistance of staff Review of Systems  Constitutional: Negative for activity change, appetite change, chills, fatigue and fever.  HENT: Positive for hearing loss. Negative for congestion.   Eyes: Negative for visual disturbance.  Respiratory: Negative for cough, choking, chest tightness, shortness of breath and wheezing.   Cardiovascular: Negative for chest pain, palpitations and leg swelling.  Genitourinary: Negative for dysuria.  Musculoskeletal: Negative for arthralgias, back pain, gait problem, joint swelling, myalgias, neck pain and neck stiffness.  Skin: Negative for color change, pallor, rash and wound.  Neurological: Negative for dizziness, syncope, speech difficulty, weakness and headaches.  Psychiatric/Behavioral: Positive for confusion. Negative for agitation and behavioral problems. The patient is not nervous/anxious.     Immunization History  Administered Date(s) Administered  . PPD Test 09/05/2016   Pertinent  Health Maintenance Due  Topic Date Due  . DEXA SCAN  04/28/1992  . INFLUENZA VACCINE  04/28/2017  . PNA vac Low Risk Adult (1 of 2 - PCV13) 07/29/2017 (Originally 04/28/1992)   Fall Risk  01/28/2016  Falls in the past year? No   Functional Status Survey:    Vitals:   07/12/17 0944  BP: 134/70  Pulse: 70  Resp: 18  Temp: 97.7 F (36.5 C)  Weight: 120 lb (54.4 kg)  Height: 5\' 5"  (1.651 m)   Body mass index is 19.97 kg/m. Physical Exam  Constitutional: She appears well-developed and well-nourished. No distress.  HENT:  Head: Normocephalic and atraumatic.  Cardiovascular: Normal rate, regular rhythm and normal  heart sounds.   No murmur heard. Pulmonary/Chest: Effort normal and breath sounds normal. No respiratory distress. She has no wheezes.  Musculoskeletal: Normal range of motion. She exhibits no edema, tenderness or deformity.  Neurological: She is alert. No cranial nerve deficit. Coordination normal.  Oriented to self and place.   Skin: Skin is warm and dry. She is not diaphoretic.  Psychiatric: She has a normal mood and affect.    Labs reviewed:  Recent Labs  11/15/16 1335 11/16/16 0512 11/17/16 0550 12/08/16 05/27/17  NA 135 138 136 136* 137  K 3.5 3.5 3.1* 4.5 4.2  CL 97* 103 100*  --   --   CO2 28 26 27   --   --   GLUCOSE 102* 93 108*  --   --   BUN 18 18 18  27* 29*  CREATININE 0.89 0.84 0.80  --  1.0  CALCIUM 9.4 9.2 9.3  --   --     Recent Labs  11/10/16 11/15/16 1335 05/27/17  AST 17 23 17   ALT 13 18 12   ALKPHOS 79 71 59  BILITOT  --  0.9  --   PROT  --  6.7  --   ALBUMIN  --  4.1  --     Recent Labs  11/15/16 1335 11/16/16 0512 11/17/16 0550 05/27/17  WBC 9.9 11.0* 10.7* 8.1  NEUTROABS 7.3  --   --   --   HGB 12.2 12.0 11.4* 11.6*  HCT 37.3 37.3 35.2* 35*  MCV 91.2 92.3 91.2  --   PLT 265 260 252 250   Lab Results  Component Value Date   TSH 4.43 05/27/2017   Lab Results  Component Value Date   HGBA1C 5.7 11/10/2016   Lab Results  Component Value Date   CHOL 200 11/10/2016   HDL 63 11/10/2016   LDLCALC 121 11/10/2016   TRIG 81 11/10/2016    Significant Diagnostic Results in last 30 days:  No results found.  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 Her dementia, is managed Namenda, resides in [place] for care, she is lacking of safety awareness at times Continue Seroquel and Namenda, close supervision for safety.   Fall fall 07/10/17, the resident reported to med tech that she  was sitting in her chair, heard a beeping noise, jumped up out of chair, feet went under her and she feel to floor,  she stated fall occurred when she lost balance, denied dizziness, chest pain, palpitation, focal weakness associated with the fall. She denied pain, aches, or change of ROM from the event. She stated she sleeps and eats well, no fever, chills, dysuria, nausea, vomiting, constipation.  Close supervision for safety since the patient is lacking of safety awareness related to her dementia, may x-ray if pain is complained.      Family/ staff Communication: plan of care reviewed with the patient and charge nurse.   Labs/tests ordered:  None  Time spend 25 minutes.

## 2017-07-12 NOTE — Assessment & Plan Note (Signed)
The patient has behavioral issues, angry, emotional outburst, failed Sertraline, started Seroquel 12.5mg  since 06/25/17, positive responses seen. Her dementia, is managed Namenda, resides in [place] for care, she is lacking of safety awareness at times Continue Seroquel and Namenda, close supervision for safety.

## 2017-07-15 ENCOUNTER — Encounter: Payer: Self-pay | Admitting: Internal Medicine

## 2017-07-15 ENCOUNTER — Non-Acute Institutional Stay: Payer: Medicare Other | Admitting: Internal Medicine

## 2017-07-15 DIAGNOSIS — F0281 Dementia in other diseases classified elsewhere with behavioral disturbance: Secondary | ICD-10-CM

## 2017-07-15 DIAGNOSIS — E039 Hypothyroidism, unspecified: Secondary | ICD-10-CM

## 2017-07-15 DIAGNOSIS — G309 Alzheimer's disease, unspecified: Secondary | ICD-10-CM | POA: Diagnosis not present

## 2017-07-15 DIAGNOSIS — I480 Paroxysmal atrial fibrillation: Secondary | ICD-10-CM

## 2017-07-15 DIAGNOSIS — I10 Essential (primary) hypertension: Secondary | ICD-10-CM

## 2017-07-15 NOTE — Progress Notes (Signed)
Location:  [facility] Room Number: 907 Place of Service:  ALF [phone]) Provider:  Blanchie Serve MD  Blanchie Serve, MD  Patient Care Team: Blanchie Serve, MD as PCP - General (Internal Medicine) Mast, Man X, NP as Nurse Practitioner (Internal Medicine) [name], MD as Attending Physician (Neurology)  Extended Emergency Contact Information Primary Emergency Contact: Key,Caroline Address: [place] Phone: [phone] Mobile Phone: [phone] Relation: Daughter Secondary Emergency Contact: Key,Tim  [place] Mobile Phone: [phone] Relation: Son  Code Status:  DNR Goals of care: Advanced Directive information Advanced Directives 07/15/2017  Does Patient Have a Medical Advance Directive? Yes  Type of Advance Directive Living will;Out of facility DNR (pink MOST or yellow form);Healthcare Power of Attorney  Does patient want to make changes to medical advance directive? No - Patient declined  Copy of [place] in Chart? Yes  Would patient like information on creating a medical advance directive? -  Pre-existing out of facility DNR order (yellow form or pink MOST form) Yellow form placed in chart (order not valid for inpatient use)     Chief Complaint  Patient presents with  . Medical Management of Chronic Issues    Routine Visit     HPI:  Pt is a 83 y.o. female seen today for medical management of chronic diseases.  She denies any concern this visit. She has dementia with delusions and hallucinations. Had a fall on 07/10/17 with no apparent injury.   Hypothyroidism- currently on levothyroxine 75 mcg daily alternating with 50 mcg daily.   Hypertension- stable on chart review. Currently on amlodipine 2.5 mg daily, coreg 6.25 mg bid, triamterene-hctz 37.5-25 mg half a tablet daily and lisinopril 10 mg daily.   Dementia with behavior disturbance-  currently on donepezil 10 mg qhs, memantine 5 mg daily and seroquel 12.5 mg daily. She has been delirious and has some psychosis present.   afib- controlled HR. Currently on coreg bid. Not on anticoagulation with her fall risk.    Past Medical History:  Diagnosis Date  . Adenomatous colon polyp 07/17/97  . Allergic rhinitis   . Allergy   . Anxiety disorder   . Atrial fibrillation ([place])   . Chronic anticoagulation   . Diverticulosis   . History of shingles   . Hypercholesteremia   . Hypertension   . Hypothyroidism   . Internal hemorrhoids   . Melanoma ([place])   . Memory loss   . Meniere disease   . Tension headache    Past Surgical History:  Procedure Laterality Date  . CATARACT EXTRACTION Right 2012    Allergies  Allergen Reactions  . Amitriptyline     Urinary Retention  . Amlodipine Swelling  . Erythromycin Hives  . Nitrofurantoin Nausea Only  . Sulfa Antibiotics     "really ill"  . Tavist [[place]ast[place]]     Urinary hesitancy  . Trimethoprim     Cramps  . Verapamil     Constipation    Outpatient Encounter Prescriptions as of 07/15/2017  Medication Sig  . acetaminophen (TYLENOL) 325 MG tablet Take 650 mg by mouth every 6 (six) hours as needed for headache.  Marland Kitchen amLODipine (NORVASC) 2.5 MG tablet Take 2.5 mg by mouth daily.   . Calcium-Phosphorus-Vitamin D (CITRACAL CALCIUM GUMMIES PO) Take by mouth. 1 gummy given daily.  . carvedilol (COREG) 6.25 MG tablet Take 1 tablet (6.25 mg  total) by mouth 2 (two) times daily with a meal.  . donepezil (ARICEPT) 10 MG tablet Take 10 mg by mouth at bedtime.   . feeding supplement (BOOST / RESOURCE BREEZE) LIQD Take 1 Container by mouth 2 (two) times daily between meals.  Marland Kitchen levothyroxine (SYNTHROID, LEVOTHROID) 50 MCG tablet Take 50 mcg by mouth See admin instructions. Only on Tuesday, Thursday, Saturday, Sunday.  . levothyroxine (SYNTHROID, LEVOTHROID) 75 MCG tablet Take 75 mcg by mouth. Take one tablet Monday and Friday  .  lisinopril (PRINIVIL,ZESTRIL) 10 MG tablet Take 10 mg by mouth daily.  . memantine (NAMENDA) 5 MG tablet Take 1 tablet (5 mg total) by mouth daily.  . QUEtiapine (SEROQUEL) 25 MG tablet Take 12.5 mg by mouth daily. At 5 pm   . triamterene-hydrochlorothiazide (MAXZIDE-25) 37.5-25 MG tablet Take 0.5 tablets by mouth daily.    No facility-administered encounter medications on file as of 07/15/2017.     Review of Systems  Constitutional: Negative for appetite change, chills and fever.  HENT: Negative for congestion, mouth sores and trouble swallowing.   Respiratory: Negative for cough and shortness of breath.   Cardiovascular: Negative for chest pain, palpitations and leg swelling.  Gastrointestinal: Negative for abdominal pain, constipation, diarrhea, nausea and vomiting.  Genitourinary: Negative for dysuria, hematuria, pelvic pain and urgency.  Musculoskeletal: Negative for arthralgias and back pain.  Skin: Negative for rash and wound.  Neurological: Negative for dizziness, numbness and headaches.  Psychiatric/Behavioral: Positive for behavioral problems, confusion and decreased concentration. Negative for sleep disturbance. The patient is not nervous/anxious.     Immunization History  Administered Date(s) Administered  . PPD Test 09/05/2016   Pertinent  Health Maintenance Due  Topic Date Due  . DEXA SCAN  04/28/1992  . INFLUENZA VACCINE  04/28/2017  . PNA vac Low Risk Adult (1 of 2 - PCV13) 07/29/2017 (Originally 04/28/1992)   Fall Risk  01/28/2016  Falls in the past year? No   Functional Status Survey:    Vitals:   07/15/17 1133  BP: 130/78  Pulse: 74  Resp: 18  Temp: 97.6 F (36.4 C)  TempSrc: Oral  Weight: 120 lb (54.4 kg)  Height: 5\' 5"  (1.651 m)   Body mass index is 19.97 kg/m.   Wt Readings from Last 3 Encounters:  07/15/17 120 lb (54.4 kg)  07/12/17 120 lb (54.4 kg)  06/25/17 122 lb (55.3 kg)   Physical Exam  Constitutional: She appears well-developed. No  distress.  Thin built and frail  HENT:  Head: Normocephalic and atraumatic.  Nose: Nose normal.  Mouth/Throat: Oropharynx is clear and moist.  Eyes: Pupils are equal, round, and reactive to light. Conjunctivae and EOM are normal.  Has corrective glasses  Neck: Normal range of motion. Neck supple. No thyromegaly present.  Cardiovascular: Normal rate and regular rhythm.   Pulmonary/Chest: Breath sounds normal. No respiratory distress. She has no wheezes. She has no rales. She exhibits no tenderness.  Abdominal: Soft. Bowel sounds are normal. She exhibits no distension. There is no tenderness. There is no guarding.  Musculoskeletal: She exhibits no edema.  Can move all 4 extremities, arthritis changes to fingers  Lymphadenopathy:    She has no cervical adenopathy.  Neurological: She is alert.  Oriented to self, place and time  Skin: Skin is warm and dry. No rash noted. She is not diaphoretic. No pallor.  Psychiatric: She has a normal mood and affect.  paranoia    Labs reviewed:  Recent Labs  11/15/16 1335 11/16/16  9030 11/17/16 0550 12/08/16 05/27/17 06/29/17  NA 135 138 136 136* 137 137  K 3.5 3.5 3.1* 4.5 4.2 4.7  CL 97* 103 100*  --   --   --   CO2 28 26 27   --   --   --   GLUCOSE 102* 93 108*  --   --   --   BUN 18 18 18  27* 29* 33*  CREATININE 0.89 0.84 0.80  --  1.0 1.1  CALCIUM 9.4 9.2 9.3  --   --   --     Recent Labs  11/15/16 1335 05/27/17 06/29/17  AST 23 17 24   ALT 18 12 29   ALKPHOS 71 59 82  BILITOT 0.9  --   --   PROT 6.7  --   --   ALBUMIN 4.1  --   --     Recent Labs  11/15/16 1335 11/16/16 0512 11/17/16 0550 05/27/17 06/29/17  WBC 9.9 11.0* 10.7* 8.1 7.6  NEUTROABS 7.3  --   --   --   --   HGB 12.2 12.0 11.4* 11.6* 12.7  HCT 37.3 37.3 35.2* 35* 38  MCV 91.2 92.3 91.2  --   --   PLT 265 260 252 250 265   Lab Results  Component Value Date   TSH 4.43 05/27/2017   Lab Results  Component Value Date   HGBA1C 5.7 11/10/2016   Lab Results    Component Value Date   CHOL 200 11/10/2016   HDL 63 11/10/2016   LDLCALC 121 11/10/2016   TRIG 81 11/10/2016    Significant Diagnostic Results in last 30 days:  No results found.  Assessment/Plan  Hypothyroidism Lab Results  Component Value Date   TSH 4.43 05/27/2017   Continue levothyroxine 75 mcg daily alternating with 50 mcg daily.   Hypertension Continue amlodipine 2.5 mg daily, coreg 6.25 mg bid, triamterene-hctz 37.5-25 mg half a tablet daily and lisinopril 10 mg daily. Monitor BMP.   Dementia with behavior disturbance Continue donepezil 10 mg qhs, memantine 5 mg daily. Change seroquel to 12.5 mg bid and monitor.   afib controlled HR. Continue coreg bid. Not on anticoagulation with her fall risk.  Lipid Panel     Component Value Date/Time   CHOL 200 11/10/2016   TRIG 81 11/10/2016   HDL 63 11/10/2016   LDLCALC 121 11/10/2016     Family/ staff Communication: reviewed care plan with patient and charge nurse.    Labs/tests ordered:  none   Blanchie Serve, MD Internal Medicine [facility] [address] [place], [place] [address] Cell Phone (Monday-Friday 8 am - 5 pm): [phone] On Call: [phone] and follow prompts after 5 pm and on weekends Office Phone: [phone] Office Fax: [phone]

## 2017-07-20 DIAGNOSIS — I10 Essential (primary) hypertension: Secondary | ICD-10-CM | POA: Diagnosis not present

## 2017-07-20 LAB — LIPID PANEL
Cholesterol: 201 — AB (ref 0–200)
HDL: 63 (ref 35–70)
LDL CALC: 123
LDL/HDL RATIO: 3.2
TRIGLYCERIDES: 59 (ref 40–160)

## 2017-07-21 ENCOUNTER — Other Ambulatory Visit: Payer: Self-pay | Admitting: *Deleted

## 2017-07-21 ENCOUNTER — Encounter: Payer: Self-pay | Admitting: Nurse Practitioner

## 2017-07-21 DIAGNOSIS — E785 Hyperlipidemia, unspecified: Secondary | ICD-10-CM | POA: Insufficient documentation

## 2017-09-30 DIAGNOSIS — E1165 Type 2 diabetes mellitus with hyperglycemia: Secondary | ICD-10-CM | POA: Diagnosis not present

## 2017-09-30 LAB — HEMOGLOBIN A1C: HEMOGLOBIN A1C: 5.7

## 2017-10-01 ENCOUNTER — Other Ambulatory Visit: Payer: Self-pay | Admitting: *Deleted

## 2017-10-01 DIAGNOSIS — Q6689 Other  specified congenital deformities of feet: Secondary | ICD-10-CM | POA: Diagnosis not present

## 2017-10-01 DIAGNOSIS — L602 Onychogryphosis: Secondary | ICD-10-CM | POA: Diagnosis not present

## 2017-10-14 ENCOUNTER — Non-Acute Institutional Stay: Payer: Medicare Other

## 2017-10-14 DIAGNOSIS — Z0000 Encounter for general adult medical examination without abnormal findings: Secondary | ICD-10-CM | POA: Diagnosis not present

## 2017-10-14 NOTE — Patient Instructions (Signed)
Jamie Thompson , Thank you for taking time to come for your Medicare Wellness Visit. I appreciate your ongoing commitment to your health goals. Please review the following plan we discussed and let me know if I can assist you in the future.   Screening recommendations/referrals: Colonoscopy excluded, pt is over age 82 Mammogram excluded, pt is over age 82 Bone Density up to date Recommended yearly ophthalmology/optometry visit for glaucoma screening and checkup Recommended yearly dental visit for hygiene and checkup  Vaccinations: Influenza vaccine up to date, due 2019 fall season Pneumococcal vaccine up to date Tdap vaccine up to date. Due 07/13/2022  Shingles vaccine not in records    Advanced directives: In Chart  Conditions/risks identified: none  Next appointment: Dr. Bubba Camp makes rounds   Preventive Care 47 Years and Older, Female Preventive care refers to lifestyle choices and visits with your health care provider that can promote health and wellness. What does preventive care include?  A yearly physical exam. This is also called an annual well check.  Dental exams once or twice a year.  Routine eye exams. Ask your health care provider how often you should have your eyes checked.  Personal lifestyle choices, including:  Daily care of your teeth and gums.  Regular physical activity.  Eating a healthy diet.  Avoiding tobacco and drug use.  Limiting alcohol use.  Practicing safe sex.  Taking low-dose aspirin every day.  Taking vitamin and mineral supplements as recommended by your health care provider. What happens during an annual well check? The services and screenings done by your health care provider during your annual well check will depend on your age, overall health, lifestyle risk factors, and family history of disease. Counseling  Your health care provider may ask you questions about your:  Alcohol use.  Tobacco use.  Drug use.  Emotional  well-being.  Home and relationship well-being.  Sexual activity.  Eating habits.  History of falls.  Memory and ability to understand (cognition).  Work and work Statistician.  Reproductive health. Screening  You may have the following tests or measurements:  Height, weight, and BMI.  Blood pressure.  Lipid and cholesterol levels. These may be checked every 5 years, or more frequently if you are over 59 years old.  Skin check.  Lung cancer screening. You may have this screening every year starting at age 63 if you have a 30-pack-year history of smoking and currently smoke or have quit within the past 15 years.  Fecal occult blood test (FOBT) of the stool. You may have this test every year starting at age 21.  Flexible sigmoidoscopy or colonoscopy. You may have a sigmoidoscopy every 5 years or a colonoscopy every 10 years starting at age 23.  Hepatitis C blood test.  Hepatitis B blood test.  Sexually transmitted disease (STD) testing.  Diabetes screening. This is done by checking your blood sugar (glucose) after you have not eaten for a while (fasting). You may have this done every 1-3 years.  Bone density scan. This is done to screen for osteoporosis. You may have this done starting at age 40.  Mammogram. This may be done every 1-2 years. Talk to your health care provider about how often you should have regular mammograms. Talk with your health care provider about your test results, treatment options, and if necessary, the need for more tests. Vaccines  Your health care provider may recommend certain vaccines, such as:  Influenza vaccine. This is recommended every year.  Tetanus, diphtheria, and  acellular pertussis (Tdap, Td) vaccine. You may need a Td booster every 10 years.  Zoster vaccine. You may need this after age 82.  Pneumococcal 13-valent conjugate (PCV13) vaccine. One dose is recommended after age 19.  Pneumococcal polysaccharide (PPSV23) vaccine. One  dose is recommended after age 61. Talk to your health care provider about which screenings and vaccines you need and how often you need them. This information is not intended to replace advice given to you by your health care provider. Make sure you discuss any questions you have with your health care provider. Document Released: 10/11/2015 Document Revised: 06/03/2016 Document Reviewed: 07/16/2015 Elsevier Interactive Patient Education  2017 [employer] Prevention in the Home Falls can cause injuries. They can happen to people of all ages. There are many things you can do to make your home safe and to help prevent falls. What can I do on the outside of my home?  Regularly fix the edges of walkways and driveways and fix any cracks.  Remove anything that might make you trip as you walk through a door, such as a raised step or threshold.  Trim any bushes or trees on the path to your home.  Use bright outdoor lighting.  Clear any walking paths of anything that might make someone trip, such as rocks or tools.  Regularly check to see if handrails are loose or broken. Make sure that both sides of any steps have handrails.  Any raised decks and porches should have guardrails on the edges.  Have any leaves, snow, or ice cleared regularly.  Use sand or salt on walking paths during winter.  Clean up any spills in your garage right away. This includes oil or grease spills. What can I do in the bathroom?  Use night lights.  Install grab bars by the toilet and in the tub and shower. Do not use towel bars as grab bars.  Use non-skid mats or decals in the tub or shower.  If you need to sit down in the shower, use a plastic, non-slip stool.  Keep the floor dry. Clean up any water that spills on the floor as soon as it happens.  Remove soap buildup in the tub or shower regularly.  Attach bath mats securely with double-sided non-slip rug tape.  Do not have throw rugs and other  things on the floor that can make you trip. What can I do in the bedroom?  Use night lights.  Make sure that you have a light by your bed that is easy to reach.  Do not use any sheets or blankets that are too big for your bed. They should not hang down onto the floor.  Have a firm chair that has side arms. You can use this for support while you get dressed.  Do not have throw rugs and other things on the floor that can make you trip. What can I do in the kitchen?  Clean up any spills right away.  Avoid walking on wet floors.  Keep items that you use a lot in easy-to-reach places.  If you need to reach something above you, use a strong step stool that has a grab bar.  Keep electrical cords out of the way.  Do not use floor polish or wax that makes floors slippery. If you must use wax, use non-skid floor wax.  Do not have throw rugs and other things on the floor that can make you trip. What can I do with my stairs?  Do not leave any items on the stairs.  Make sure that there are handrails on both sides of the stairs and use them. Fix handrails that are broken or loose. Make sure that handrails are as long as the stairways.  Check any carpeting to make sure that it is firmly attached to the stairs. Fix any carpet that is loose or worn.  Avoid having throw rugs at the top or bottom of the stairs. If you do have throw rugs, attach them to the floor with carpet tape.  Make sure that you have a light switch at the top of the stairs and the bottom of the stairs. If you do not have them, ask someone to add them for you. What else can I do to help prevent falls?  Wear shoes that:  Do not have high heels.  Have rubber bottoms.  Are comfortable and fit you well.  Are closed at the toe. Do not wear sandals.  If you use a stepladder:  Make sure that it is fully opened. Do not climb a closed stepladder.  Make sure that both sides of the stepladder are locked into place.  Ask  someone to hold it for you, if possible.  Clearly mark and make sure that you can see:  Any grab bars or handrails.  First and last steps.  Where the edge of each step is.  Use tools that help you move around (mobility aids) if they are needed. These include:  Canes.  Walkers.  Scooters.  Crutches.  Turn on the lights when you go into a dark area. Replace any light bulbs as soon as they burn out.  Set up your furniture so you have a clear path. Avoid moving your furniture around.  If any of your floors are uneven, fix them.  If there are any pets around you, be aware of where they are.  Review your medicines with your doctor. Some medicines can make you feel dizzy. This can increase your chance of falling. Ask your doctor what other things that you can do to help prevent falls. This information is not intended to replace advice given to you by your health care provider. Make sure you discuss any questions you have with your health care provider. Document Released: 07/11/2009 Document Revised: 02/20/2016 Document Reviewed: 10/19/2014 Elsevier Interactive Patient Education  2017 [employer].

## 2017-10-14 NOTE — Progress Notes (Signed)
Subjective:   Jamie Thompson is a 84 y.o. female who presents for Medicare Annual (Subsequent) preventive examination.  Last AWV-09/16/2016       Objective:     Vitals: BP 130/70 (BP Location: Left Arm, Patient Position: Sitting)   Pulse 77   Temp 97.8 F (36.6 C) (Oral)   Ht 5\' 5"  (1.651 m)   Wt 120 lb (54.4 kg)   SpO2 97%   BMI 19.97 kg/m   Body mass index is 19.97 kg/m.  Advanced Directives 10/14/2017 07/15/2017 07/12/2017 06/25/2017 06/07/2017 05/25/2017 12/03/2016  Does Patient Have a Medical Advance Directive? Yes Yes Yes Yes Yes Yes Yes  Type of Paramedic of [place];Living will;Out of facility DNR (pink MOST or yellow form) Living will;Out of facility DNR (pink MOST or yellow form);Healthcare Power of [employer] Living will;Healthcare Power of [place];Out of facility DNR (pink MOST or yellow form) Living will;Out of facility DNR (pink MOST or yellow form);Healthcare Power of [place];Living will;Out of facility DNR (pink MOST or yellow form) [place];Living will;Out of facility DNR (pink MOST or yellow form) [place];Living will;Out of facility DNR (pink MOST or yellow form)  Does patient want to make changes to medical advance directive? No - Patient declined No - Patient declined No - Patient declined No - Patient declined No - Patient declined No - Patient declined No - Patient declined  Copy of [place] in Chart? Yes Yes Yes Yes Yes Yes Yes  Would patient like information on creating a medical advance directive? - - - - - - -  Pre-existing out of facility DNR order (yellow form or pink MOST form) Yellow form placed in chart (order not valid for inpatient use) Yellow form placed in chart (order not valid for inpatient use) Yellow form placed in chart (order not valid for inpatient use) Yellow form placed in chart (order not valid for inpatient use) Yellow form placed in  chart (order not valid for inpatient use) Yellow form placed in chart (order not valid for inpatient use) Yellow form placed in chart (order not valid for inpatient use)    Tobacco Social History   Tobacco Use  Smoking Status Never Smoker  Smokeless Tobacco Never Used     Counseling given: Not Answered   Clinical Intake:  Pre-visit preparation completed: No  Pain : No/denies pain     Nutritional Risks: None Diabetes: No  How often do you need to have someone help you when you read instructions, pamphlets, or other written materials from your doctor or pharmacy?: 1 - Never  Interpreter Needed?: No  Information entered by :: Tyson Dense, RN  Past Medical History:  Diagnosis Date  . Adenomatous colon polyp 07/17/97  . Allergic rhinitis   . Allergy   . Anxiety disorder   . Atrial fibrillation ([place])   . Chronic anticoagulation   . Diverticulosis   . History of shingles   . Hypercholesteremia   . Hypertension   . Hypothyroidism   . Internal hemorrhoids   . Melanoma ([place])   . Memory loss   . Meniere disease   . Tension headache    Past Surgical History:  Procedure Laterality Date  . CATARACT EXTRACTION Right 2012   Family History  Problem Relation Age of Onset  . Stroke Father   . Clotting disorder Father   . Colon cancer Mother   . Colon cancer Sister   . Clotting disorder  Sister    Social History   Socioeconomic History  . Marital status: Single    Spouse name: None  . Number of children: 2  . Years of education: 76yr colg  . Highest education level: None  Social Needs  . Financial resource strain: Not hard at all  . Food insecurity - worry: Never true  . Food insecurity - inability: Never true  . Transportation needs - medical: No  . Transportation needs - non-medical: No  Occupational History  . Occupation: Retired Recruitment consultant  . Smoking status: Never Smoker  . Smokeless tobacco: Never Used  Substance and Sexual Activity  .  Alcohol use: No    Alcohol/week: 0.0 oz  . Drug use: No  . Sexual activity: No  Other Topics Concern  . None  Social History Narrative   1 cup of coffee a day, occasionally drinks tea or soda   Social History     Marital status: Widowed             Spouse name:                        Years of education:        Number of children: 2               Occupational History   Occupation : Surveyor, mining            Comment                Retired                                       Social History Main Topics     Smoking status: Never Smoker                                                                  Smokeless tobacco: Never Used                         Alcohol use: No               Drug use: No               Sexual activity: Not on file            Other Topics            Concern     None on file      Social History Narrative     1 cup of coffee a day, occasionally drinks tea or soda.   She does not exercise, has a living will, DNR and POA   Lives at [facility]          Outpatient Encounter Medications as of 10/14/2017  Medication Sig  . acetaminophen (TYLENOL) 325 MG tablet Take 650 mg by mouth every 6 (six) hours as needed for headache.  Marland Kitchen amLODipine (NORVASC) 2.5 MG tablet Take 2.5 mg by mouth daily.   . Calcium-Phosphorus-Vitamin D (CITRACAL CALCIUM GUMMIES PO) Take by mouth. 1 gummy  given daily.  . carvedilol (COREG) 6.25 MG tablet Take 1 tablet (6.25 mg total) by mouth 2 (two) times daily with a meal.  . donepezil (ARICEPT) 10 MG tablet Take 10 mg by mouth at bedtime.   . feeding supplement (BOOST / RESOURCE BREEZE) LIQD Take 1 Container by mouth 2 (two) times daily between meals.  Marland Kitchen levothyroxine (SYNTHROID, LEVOTHROID) 50 MCG tablet Take 50 mcg by mouth See admin instructions. Only on Tuesday, Thursday, Saturday, Sunday.  . levothyroxine (SYNTHROID, LEVOTHROID) 75 MCG tablet Take 75 mcg by mouth. Take one tablet Monday and Friday  .  lisinopril (PRINIVIL,ZESTRIL) 10 MG tablet Take 10 mg by mouth daily.  . memantine (NAMENDA) 5 MG tablet Take 1 tablet (5 mg total) by mouth daily.  . QUEtiapine (SEROQUEL) 25 MG tablet Take 12.5 mg by mouth daily. At 5 pm   . triamterene-hydrochlorothiazide (MAXZIDE-25) 37.5-25 MG tablet Take 0.5 tablets by mouth daily.    No facility-administered encounter medications on file as of 10/14/2017.     Activities of Daily Living In your present state of health, do you have any difficulty performing the following activities: 10/14/2017  Hearing? Y  Vision? N  Difficulty concentrating or making decisions? Y  Walking or climbing stairs? Y  Dressing or bathing? N  Doing errands, shopping? Y  Preparing Food and eating ? N  Using the Toilet? N  In the past six months, have you accidently leaked urine? N  Do you have problems with loss of bowel control? N  Managing your Medications? Y  Managing your Finances? Y  Housekeeping or managing your Housekeeping? Y  Some recent data might be hidden    Patient Care Team: Blanchie Serve, MD as PCP - General (Internal Medicine) Mast, Man X, NP as Nurse Practitioner (Internal Medicine) [name], MD as Attending Physician (Neurology)    Assessment:   This is a routine wellness examination for [facility].  Exercise Activities and Dietary recommendations Current Exercise Habits: The patient does not participate in regular exercise at present, Exercise limited by: None identified  Goals    None      Fall Risk Fall Risk  10/14/2017 01/28/2016  Falls in the past year? Yes No  Number falls in past yr: 1 -  Injury with Fall? Yes -   Is the patient's home free of loose throw rugs in walkways, pet beds, electrical cords, etc?   yes      Grab bars in the bathroom? yes      Handrails on the stairs?   yes      Adequate lighting?   yes  Timed Get Up and Go performed: 20 seconds, fall risk  Depression Screen PHQ 2/9 Scores 10/14/2017 06/20/2015  PHQ - 2  Score 0 0     Cognitive Function MMSE - Mini Mental State Exam 12/08/2016 12/08/2016 11/11/2016 04/07/2016 10/30/2015  Orientation to time 2 2 3 4 5   Orientation to Place 4 4 3 5 5   Registration 3 3 2 3 3   Attention/ Calculation 5 5 3 5 5   Attention/Calculation-comments - - - refused numbers -  Recall 0 0 0 2 2  Language- name 2 objects 2 2 2 2 2   Language- repeat 1 1 1 1 1   Language- follow 3 step command 2 2 3 3 3   Language- read & follow direction 1 1 1 1 1   Write a sentence 1 1 1 1 1   Copy design 1 1 1  0 1  Total score 22  22 20 27 29         Immunization History  Administered Date(s) Administered  . Influenza-Unspecified 07/19/2017  . PPD Test 09/05/2016    Qualifies for Shingles Vaccine? Yes, over age 66 and no past records  Screening Tests Health Maintenance  Topic Date Due  . Samul Dada  04/28/1944  . DEXA SCAN  04/28/1992  . PNA vac Low Risk Adult (1 of 2 - PCV13) 04/28/1992  . INFLUENZA VACCINE  Completed    Cancer Screenings: Lung: Low Dose CT Chest recommended if Age 76-80 years, 30 pack-year currently smoking OR have quit w/in 15years. Patient does not qualify. Breast:  Up to date on Mammogram? Yes   Up to date of Bone Density/Dexa? Yes Colorectal: up to date  Additional Screenings:  Hepatitis B/HIV/Syphillis:declined Hepatitis C Screening: declined     Plan:    I have personally reviewed and addressed the Medicare Annual Wellness questionnaire and have noted the following in the patient's chart:  A. Medical and social history B. Use of alcohol, tobacco or illicit drugs  C. Current medications and supplements D. Functional ability and status E.  Nutritional status F.  Physical activity G. Advance directives H. List of other physicians I.  Hospitalizations, surgeries, and ER visits in previous 12 months J.  [place] to include hearing, vision, cognitive, depression L. Referrals and appointments - none  In addition, I have reviewed and  discussed with patient certain preventive protocols, quality metrics, and best practice recommendations. A written personalized care plan for preventive services as well as general preventive health recommendations were provided to patient.  See attached scanned questionnaire for additional information.   Signed,   Tyson Dense, RN Nurse Health Advisor   Quick Notes   Health Maintenance: TDAP, DEXA, PNA 13 and 23 up to date per patient and [facility].      Abnormal Screen: MMSE 22/30 in 11/2016     Patient Concerns: None     Nurse Concerns: None

## 2017-10-21 ENCOUNTER — Non-Acute Institutional Stay: Payer: Medicare Other | Admitting: Nurse Practitioner

## 2017-10-21 ENCOUNTER — Encounter: Payer: Self-pay | Admitting: Nurse Practitioner

## 2017-10-21 DIAGNOSIS — I10 Essential (primary) hypertension: Secondary | ICD-10-CM

## 2017-10-21 DIAGNOSIS — G309 Alzheimer's disease, unspecified: Secondary | ICD-10-CM | POA: Diagnosis not present

## 2017-10-21 DIAGNOSIS — E039 Hypothyroidism, unspecified: Secondary | ICD-10-CM

## 2017-10-21 DIAGNOSIS — E782 Mixed hyperlipidemia: Secondary | ICD-10-CM

## 2017-10-21 DIAGNOSIS — F0281 Dementia in other diseases classified elsewhere with behavioral disturbance: Secondary | ICD-10-CM | POA: Diagnosis not present

## 2017-10-21 DIAGNOSIS — I480 Paroxysmal atrial fibrillation: Secondary | ICD-10-CM | POA: Diagnosis not present

## 2017-10-21 NOTE — Assessment & Plan Note (Signed)
07/20/17 cholesterol 201, triglycerides 59, HDL 63, LDL 123 10/21/17 may consider Atorvastatin 10mg  po daily if POA consents. Then f/u lipid panel in 3 months.

## 2017-10-21 NOTE — Assessment & Plan Note (Signed)
Heart rate is in control, continue Carvedilol 6.25mg  bid.

## 2017-10-21 NOTE — Progress Notes (Signed)
Location:   [facility] Room Number: 907 Place of Service: [facility] Provider:  Lennie Odor Deandrea Rion NP  Blanchie Serve, MD  Patient Care Team: Blanchie Serve, MD as PCP - General (Internal Medicine) Daksha Koone X, NP as Nurse Practitioner (Internal Medicine) [name], MD as Attending Physician (Neurology)  Extended Emergency Contact Information Primary Emergency Contact: Key,Caroline Address: [place] Phone: [phone] Mobile Phone: [phone] Relation: Daughter Secondary Emergency Contact: Key,Tim  [place] Mobile Phone: [phone] Relation: Son  Code Status:  DNR Goals of care: Advanced Directive information Advanced Directives 10/21/2017  Does Patient Have a Medical Advance Directive? Yes  Type of Paramedic of [place];Living will;Out of facility DNR (pink MOST or yellow form)  Does patient want to make changes to medical advance directive? No - Patient declined  Copy of [place] in Chart? Yes  Would patient like information on creating a medical advance directive? -  Pre-existing out of facility DNR order (yellow form or pink MOST form) Yellow form placed in chart (order not valid for inpatient use)     Chief Complaint  Patient presents with  . Medical Management of Chronic Issues    Routine Visit     HPI:  Pt is a 84 y.o. female seen today for medical management of chronic diseases.     The patient has history of dementia, resides in [facility], ambulates with walker, self transfer and tolileting. On Memantine, Donepezil, Quetiapine 12.5mg  bid for memory and abnormal thoughts. Her blood pressure is controlled on Lisinopril 10mg  qd and Carvedilol 6.25mg  bid, Amlodipine 2.5mg  daily. Her hypothyroidism is controlled on Levothyroxine 143mcg po daily, last TSH, Afib, heart rate is in control.    Past Medical History:  Diagnosis Date  .  Adenomatous colon polyp 07/17/97  . Allergic rhinitis   . Allergy   . Anxiety disorder   . Atrial fibrillation ([place])   . Chronic anticoagulation   . Diverticulosis   . History of shingles   . Hypercholesteremia   . Hypertension   . Hypothyroidism   . Internal hemorrhoids   . Melanoma ([place])   . Memory loss   . Meniere disease   . Tension headache    Past Surgical History:  Procedure Laterality Date  . CATARACT EXTRACTION Right 2012    Allergies  Allergen Reactions  . Amitriptyline     Urinary Retention  . Amlodipine Swelling  . Erythromycin Hives  . Nitrofurantoin Nausea Only  . Sulfa Antibiotics     "really ill"  . Tavist [Clemastine]     Urinary hesitancy  . Trimethoprim     Cramps  . Verapamil     Constipation    Allergies as of 10/21/2017      Reactions   Amitriptyline    Urinary Retention   Amlodipine Swelling   Erythromycin Hives   Nitrofurantoin Nausea Only   Sulfa Antibiotics    "really ill"   Tavist [clemastine]    Urinary hesitancy   Trimethoprim    Cramps   Verapamil    Constipation      Medication List        Accurate as of 10/21/17 12:19 PM. Always use your most recent med list.          acetaminophen 325 MG tablet Commonly known as:  TYLENOL Take 650 mg by mouth every 6 (six) hours as  needed for headache.   amLODipine 2.5 MG tablet Commonly known as:  NORVASC Take 2.5 mg by mouth daily.   carvedilol 6.25 MG tablet Commonly known as:  COREG Take 1 tablet (6.25 mg total) by mouth 2 (two) times daily with a meal.   CITRACAL CALCIUM GUMMIES PO Take by mouth. 1 gummy given daily.   donepezil 10 MG tablet Commonly known as:  ARICEPT Take 10 mg by mouth at bedtime.   feeding supplement Liqd Take 1 Container by mouth 2 (two) times daily between meals.   levothyroxine 50 MCG tablet Commonly known as:  SYNTHROID, LEVOTHROID Take 50 mcg by mouth See admin instructions. Only on Tuesday, Thursday, Saturday, Sunday.     levothyroxine 75 MCG tablet Commonly known as:  SYNTHROID, LEVOTHROID Take 75 mcg by mouth. Take one tablet Monday and Friday   lisinopril 10 MG tablet Commonly known as:  PRINIVIL,ZESTRIL Take 10 mg by mouth daily.   memantine 5 MG tablet Commonly known as:  NAMENDA Take 1 tablet (5 mg total) by mouth daily.   SEROQUEL 25 MG tablet Generic drug:  QUEtiapine Take 12.5 mg by mouth 2 (two) times daily.   triamterene-hydrochlorothiazide 37.5-25 MG tablet Commonly known as:  MAXZIDE-25 Take 0.5 tablets by mouth daily.      ROS was provided with assistance of staff Review of Systems  Constitutional: Negative for activity change, appetite change, chills, diaphoresis, fatigue and fever.  HENT: Positive for hearing loss. Negative for congestion, trouble swallowing and voice change.   Eyes: Negative for visual disturbance.  Respiratory: Negative for cough, choking, shortness of breath and wheezing.   Cardiovascular: Negative for chest pain, palpitations and leg swelling.  Gastrointestinal: Negative for abdominal distention, abdominal pain, constipation, diarrhea, nausea and vomiting.  Endocrine: Negative for cold intolerance.  Genitourinary: Negative for difficulty urinating, dysuria, frequency and urgency.  Musculoskeletal: Positive for gait problem. Negative for joint swelling.  Skin: Negative for color change and pallor.  Neurological: Negative for tremors, speech difficulty, weakness and headaches.  Psychiatric/Behavioral: Positive for confusion. Negative for agitation, behavioral problems, hallucinations and sleep disturbance. The patient is not nervous/anxious.     Immunization History  Administered Date(s) Administered  . Influenza-Unspecified 07/19/2017  . PPD Test 09/05/2016   Pertinent  Health Maintenance Due  Topic Date Due  . DEXA SCAN  04/28/1992  . PNA vac Low Risk Adult (1 of 2 - PCV13) 04/28/1992  . INFLUENZA VACCINE  Completed   Fall Risk  10/14/2017 01/28/2016   Falls in the past year? Yes No  Number falls in past yr: 1 -  Injury with Fall? Yes -   Functional Status Survey:    Vitals:   10/21/17 1119  BP: 136/80  Pulse: 74  Resp: 18  Temp: (!) 97.1 F (36.2 C)  TempSrc: Oral  SpO2: 97%  Weight: 125 lb 3.2 oz (56.8 kg)  Height: 5\' 6"  (1.676 m)   Body mass index is 20.21 kg/m. Physical Exam  Constitutional: She appears well-developed and well-nourished. No distress.  HENT:  Head: Normocephalic and atraumatic.  Eyes: Conjunctivae and EOM are normal. Pupils are equal, round, and reactive to light. Right eye exhibits no discharge. Left eye exhibits no discharge.  Corrective lenses.   Neck: Normal range of motion. Neck supple. No JVD present. No thyromegaly present.  Cardiovascular: Normal rate and regular rhythm.  No murmur heard. Pulmonary/Chest: Effort normal and breath sounds normal. She has no wheezes. She has no rales.  Abdominal: Soft. Bowel sounds are normal.  She exhibits no distension. There is no tenderness.  Musculoskeletal: She exhibits no edema or tenderness.  Ambulates with walker, self transfer and toileting.   Neurological: She is alert. She exhibits abnormal muscle tone. Coordination normal.  Oriented to person and place.   Skin: Skin is warm and dry. She is not diaphoretic.  Psychiatric: She has a normal mood and affect. Her behavior is normal. Thought content normal.  No paranoia or tearfulness during today's visit.      Labs reviewed: Recent Labs    11/15/16 1335 11/16/16 0512 11/17/16 0550 12/08/16 05/27/17 06/29/17  NA 135 138 136 136* 137 137  K 3.5 3.5 3.1* 4.5 4.2 4.7  CL 97* 103 100*  --   --   --   CO2 28 26 27   --   --   --   GLUCOSE 102* 93 108*  --   --   --   BUN 18 18 18  27* 29* 33*  CREATININE 0.89 0.84 0.80  --  1.0 1.1  CALCIUM 9.4 9.2 9.3  --   --   --    Recent Labs    11/15/16 1335 05/27/17 06/29/17  AST 23 17 24   ALT 18 12 29   ALKPHOS 71 59 82  BILITOT 0.9  --   --   PROT 6.7   --   --   ALBUMIN 4.1  --   --    Recent Labs    11/15/16 1335 11/16/16 0512 11/17/16 0550 05/27/17 06/29/17  WBC 9.9 11.0* 10.7* 8.1 7.6  NEUTROABS 7.3  --   --   --   --   HGB 12.2 12.0 11.4* 11.6* 12.7  HCT 37.3 37.3 35.2* 35* 38  MCV 91.2 92.3 91.2  --   --   PLT 265 260 252 250 265   Lab Results  Component Value Date   TSH 4.43 05/27/2017   Lab Results  Component Value Date   HGBA1C 5.7 09/30/2017   Lab Results  Component Value Date   CHOL 201 (A) 07/20/2017   HDL 63 07/20/2017   LDLCALC 123 07/20/2017   TRIG 59 07/20/2017    Significant Diagnostic Results in last 30 days:  No results found.  Assessment/Plan  Alzheimer's dementia with behavioral disturbance The patient has history of dementia, resides in [facility], ambulates with walker, self transfer and tolileting. Continue Memantine, Donepezil, Quetiapine 12.5mg  bid for memory and abnormal thoughts. No paranoia or tearfulness during today's visit.     HTN (hypertension) Her blood pressure is controlled, continue Lisinopril 10mg  qd and Carvedilol 6.25mg  bid, Amlodipine 2.5mg  daily. .   Atrial fibrillation (HCC) Heart rate is in control, continue Carvedilol 6.25mg  bid.   Hypothyroidism Her hypothyroidism is controlled on Levothyroxine 174mcg po daily, last TSH 4.43 05/27/17  Hyperlipidemia 07/20/17 cholesterol 201, triglycerides 59, HDL 63, LDL 123 10/21/17 may consider Atorvastatin 10mg  po daily if POA consents. Then f/u lipid panel in 3 months.    Family/ staff Communication: plan of care reviewed with the patient and charge nurse  Labs/tests ordered:  Lipid panel 3 months.   Time spend 25 minutes

## 2017-10-21 NOTE — Assessment & Plan Note (Addendum)
The patient has history of dementia, resides in [facility], ambulates with walker, self transfer and tolileting. Continue Memantine, Donepezil, Quetiapine 12.5mg  bid for memory and abnormal thoughts. No paranoia or tearfulness during today's visit.

## 2017-10-21 NOTE — Assessment & Plan Note (Signed)
Her blood pressure is controlled, continue Lisinopril 10mg  qd and Carvedilol 6.25mg  bid, Amlodipine 2.5mg  daily. Marland Kitchen

## 2017-10-21 NOTE — Assessment & Plan Note (Signed)
Her hypothyroidism is controlled on Levothyroxine 124mcg po daily, last TSH 4.43 05/27/17

## 2017-10-26 ENCOUNTER — Ambulatory Visit: Payer: Medicare Other | Admitting: Adult Health

## 2017-10-26 DIAGNOSIS — H903 Sensorineural hearing loss, bilateral: Secondary | ICD-10-CM | POA: Diagnosis not present

## 2017-10-26 DIAGNOSIS — F0281 Dementia in other diseases classified elsewhere with behavioral disturbance: Secondary | ICD-10-CM | POA: Diagnosis not present

## 2017-10-26 DIAGNOSIS — T162XXA Foreign body in left ear, initial encounter: Secondary | ICD-10-CM | POA: Diagnosis not present

## 2017-10-26 DIAGNOSIS — H8103 Meniere's disease, bilateral: Secondary | ICD-10-CM | POA: Diagnosis not present

## 2017-10-26 DIAGNOSIS — H6123 Impacted cerumen, bilateral: Secondary | ICD-10-CM | POA: Diagnosis not present

## 2017-11-20 ENCOUNTER — Encounter (HOSPITAL_COMMUNITY): Payer: Self-pay | Admitting: Emergency Medicine

## 2017-11-20 ENCOUNTER — Emergency Department (HOSPITAL_COMMUNITY)
Admission: EM | Admit: 2017-11-20 | Discharge: 2017-11-20 | Disposition: A | Discharge status: Home / Self Care | Payer: Medicare Other | Attending: Emergency Medicine | Admitting: Emergency Medicine

## 2017-11-20 DIAGNOSIS — I10 Essential (primary) hypertension: Secondary | ICD-10-CM | POA: Diagnosis not present

## 2017-11-20 DIAGNOSIS — K649 Unspecified hemorrhoids: Secondary | ICD-10-CM | POA: Insufficient documentation

## 2017-11-20 DIAGNOSIS — Z79899 Other long term (current) drug therapy: Secondary | ICD-10-CM | POA: Diagnosis not present

## 2017-11-20 DIAGNOSIS — K648 Other hemorrhoids: Secondary | ICD-10-CM | POA: Diagnosis not present

## 2017-11-20 DIAGNOSIS — E039 Hypothyroidism, unspecified: Secondary | ICD-10-CM | POA: Diagnosis not present

## 2017-11-20 DIAGNOSIS — G308 Other Alzheimer's disease: Secondary | ICD-10-CM | POA: Insufficient documentation

## 2017-11-20 DIAGNOSIS — F0281 Dementia in other diseases classified elsewhere with behavioral disturbance: Secondary | ICD-10-CM | POA: Diagnosis not present

## 2017-11-20 MED ORDER — HYDROCORTISONE 2.5 % RE CREA: TOPICAL_CREAM | RECTAL | 0 refills | Status: DC

## 2017-11-20 NOTE — ED Notes (Signed)
Pt going home with daughter  

## 2017-11-20 NOTE — ED Provider Notes (Signed)
[facility] EMERGENCY DEPARTMENT Provider Note   CSN: [id number] Arrival date & time: 11/20/17  1047     History   Chief Complaint Chief Complaint  Patient presents with  . Hemorrhoids    HPI Jamie Thompson is a 84 y.o. female.  Patient had some rectal bleeding today.  It was bright red blood.  She was examined at the nursing home and found to have hemorrhoids that were bleeding slightly   The history is provided by the patient and a relative. No language interpreter was used.  Rectal Bleeding  Quality:  Bright red Amount:  Moderate Timing:  Intermittent Chronicity:  New Context: not anal fissures   Similar prior episodes: no   Associated symptoms: no abdominal pain     Past Medical History:  Diagnosis Date  . Adenomatous colon polyp 07/17/97  . Allergic rhinitis   . Allergy   . Anxiety disorder   . Atrial fibrillation ([place])   . Chronic anticoagulation   . Diverticulosis   . History of shingles   . Hypercholesteremia   . Hypertension   . Hypothyroidism   . Internal hemorrhoids   . Melanoma ([place])   . Memory loss   . Meniere disease   . Tension headache     Patient Active Problem List   Diagnosis Date Noted  . Hyperlipidemia 07/21/2017  . Alzheimer's dementia with behavioral disturbance 07/15/2017  . Dementia in Alzheimer's disease with depression 06/07/2017  . Hypothyroidism 11/05/2016  . HTN (hypertension) 11/05/2016  . Atrial fibrillation ([place]) 06/01/2016  . Dementia 06/01/2016  . Internal hemorrhoid 07/19/2015    Past Surgical History:  Procedure Laterality Date  . CATARACT EXTRACTION Right 2012    OB History    No data available       Home Medications    Prior to Admission medications   Medication Sig Start Date End Date Taking? Authorizing Provider  acetaminophen (TYLENOL) 325 MG tablet Take 650 mg by mouth every 6 (six) hours as needed for headache.    [provider]  amLODipine (NORVASC) 2.5 MG tablet  Take 2.5 mg by mouth daily.  07/10/16   [provider]  Calcium-Phosphorus-Vitamin D ([place]) Take by mouth. 1 gummy given daily.    [provider]  carvedilol (COREG) 6.25 MG tablet Take 1 tablet (6.25 mg total) by mouth 2 (two) times daily with a meal. 06/03/16   Eugenie Filler, MD  donepezil (ARICEPT) 10 MG tablet Take 10 mg by mouth at bedtime.  07/09/16   [provider]  feeding supplement (BOOST / RESOURCE BREEZE) LIQD Take 1 Container by mouth 2 (two) times daily between meals.    [provider]  hydrocortisone (ANUSOL-HC) 2.5 % rectal cream Apply rectally 2 times daily 11/20/17   Milton Ferguson, MD  levothyroxine (SYNTHROID, LEVOTHROID) 50 MCG tablet Take 50 mcg by mouth See admin instructions. Only on Tuesday, Thursday, Saturday, Sunday.    [provider]  levothyroxine (SYNTHROID, LEVOTHROID) 75 MCG tablet Take 75 mcg by mouth. Take one tablet Monday and Friday    [provider]  lisinopril (PRINIVIL,ZESTRIL) 10 MG tablet Take 10 mg by mouth daily.    [provider]  memantine (NAMENDA) 5 MG tablet Take 1 tablet (5 mg total) by mouth daily. 06/10/17   [name], MD  QUEtiapine (SEROQUEL) 25 MG tablet Take 12.5 mg by mouth 2 (two) times daily.     [provider]  triamterene-hydrochlorothiazide [facility]) 37.5-25  MG tablet Take 0.5 tablets by mouth daily.  10/29/15   [provider]    Family History Family History  Problem Relation Age of Onset  . Stroke Father   . Clotting disorder Father   . Colon cancer Mother   . Colon cancer Sister   . Clotting disorder Sister     Social History Social History   Tobacco Use  . Smoking status: Never Smoker  . Smokeless tobacco: Never Used  Substance Use Topics  . Alcohol use: No    Alcohol/week: 0.0 oz  . Drug use: No     Allergies   Amitriptyline; Amlodipine; Erythromycin; Nitrofurantoin; Sulfa antibiotics; Tavist  [clemastine]; Trimethoprim; and Verapamil   Review of Systems Review of Systems  Constitutional: Negative for appetite change and fatigue.  HENT: Negative for congestion, ear discharge and sinus pressure.   Eyes: Negative for discharge.  Respiratory: Negative for cough.   Cardiovascular: Negative for chest pain.  Gastrointestinal: Positive for hematochezia. Negative for abdominal pain and diarrhea.       Rectal bleeding  Genitourinary: Negative for frequency and hematuria.  Musculoskeletal: Negative for back pain.  Skin: Negative for rash.  Neurological: Negative for seizures and headaches.  Psychiatric/Behavioral: Negative for hallucinations.     Physical Exam Updated Vital Signs BP 136/83 (BP Location: Left Arm)   Pulse 74   Temp 98.4 F (36.9 C) (Oral)   Resp 16   SpO2 100%   Physical Exam  Constitutional: She appears well-developed.  HENT:  Head: Normocephalic.  Eyes: Conjunctivae and EOM are normal. No scleral icterus.  Neck: Neck supple. No thyromegaly present.  Cardiovascular: Normal rate and regular rhythm. Exam reveals no gallop and no friction rub.  No murmur heard. Pulmonary/Chest: No stridor. She has no wheezes. She has no rales. She exhibits no tenderness.  Abdominal: She exhibits no distension. There is no tenderness. There is no rebound.  Genitourinary:  Genitourinary Comments: Patient has internal hemorrhoids that look inflamed but no bleeding now  Musculoskeletal: Normal range of motion. She exhibits no edema.  Lymphadenopathy:    She has no cervical adenopathy.  Neurological: She is alert. She exhibits normal muscle tone. Coordination normal.  Oriented to person only  Skin: No rash noted. No erythema.     ED Treatments / Results  Labs (all labs ordered are listed, but only abnormal results are displayed) Labs Reviewed - No data to display  EKG  EKG Interpretation None       Radiology No results found.  Procedures Procedures  (including critical care time)  Medications Ordered in ED Medications - No data to display   Initial Impression / Assessment and Plan / ED Course  I have reviewed the triage vital signs and the nursing notes.  Pertinent labs & imaging results that were available during my care of the patient were reviewed by me and considered in my medical decision making (see chart for details).    Patient with hemorrhoids.  She will be treated with Anusol cream and follow-up with her doctor  Final Clinical Impressions(s) / ED Diagnoses   Final diagnoses:  Acute hemorrhoid    ED Discharge Orders        Ordered    hydrocortisone (ANUSOL-HC) 2.5 % rectal cream     11/20/17 1252       Milton Ferguson, MD 11/20/17 1257

## 2017-11-20 NOTE — Discharge Instructions (Signed)
Follow up your md next week

## 2017-11-20 NOTE — ED Triage Notes (Signed)
Pt presents to ED for assessment with daughter from [place] with a hx of Dementia.  Patient noted bright red blood from her rectum today, staff noted hemorrhoids.  Sent here for assessment.

## 2017-11-23 ENCOUNTER — Encounter: Payer: Self-pay | Admitting: Internal Medicine

## 2017-11-23 ENCOUNTER — Ambulatory Visit: Payer: Self-pay | Admitting: Internal Medicine

## 2017-11-23 ENCOUNTER — Non-Acute Institutional Stay: Payer: Medicare Other | Admitting: Internal Medicine

## 2017-11-23 VITALS — BP 124/68 | HR 74 | Temp 97.4°F | Resp 16 | Ht 66.0 in | Wt 127.8 lb

## 2017-11-23 DIAGNOSIS — K625 Hemorrhage of anus and rectum: Secondary | ICD-10-CM

## 2017-11-23 DIAGNOSIS — K648 Other hemorrhoids: Secondary | ICD-10-CM | POA: Diagnosis not present

## 2017-11-23 NOTE — Progress Notes (Signed)
Location:  [place] of Service:  Clinic (12) Provider:  Blanchie Serve, MD  Blanchie Serve, MD  Patient Care Team: Blanchie Serve, MD as PCP - General (Internal Medicine) Mast, Man X, NP as Nurse Practitioner (Internal Medicine) [name], MD as Attending Physician (Neurology)  Extended Emergency Contact Information Primary Emergency Contact: Key,Caroline Address: [place] Phone: [phone] Mobile Phone: [phone] Relation: Daughter Secondary Emergency Contact: Key,Tim  [place] Mobile Phone: [phone] Relation: Son   Goals of care: Advanced Directive information Advanced Directives 11/20/2017  Does Patient Have a Medical Advance Directive? Yes  Type of Advance Directive [place]  Does patient want to make changes to medical advance directive? -  Copy of [place] in Chart? -  Would patient like information on creating a medical advance directive? -  Pre-existing out of facility DNR order (yellow form or pink MOST form) -     Chief Complaint  Patient presents with  . Follow-up    rectal bleed    HPI:  Pt is a 84 y.o. female seen today for an acute visit for ED follow up. She noticed blood on 11/20/17 while having a bowel movement in toilet bowl. She noticed frank blood in toilet bowel and some in toilet paper. She was sent to ED and was noticed to have internal hemorrhoids. She noticed some blood in the toilet paper on 11/21/17 as well. No further bleeding.  She has not noticed any bleed   Past Medical History:  Diagnosis Date  . Adenomatous colon polyp 07/17/97  . Allergic rhinitis   . Allergy   . Anxiety disorder   . Atrial fibrillation ([place])   . Chronic anticoagulation   . Diverticulosis   . History of shingles   . Hypercholesteremia   . Hypertension   . Hypothyroidism   . Internal hemorrhoids   . Melanoma  ([place])   . Memory loss   . Meniere disease   . Tension headache    Past Surgical History:  Procedure Laterality Date  . CATARACT EXTRACTION Right 2012    Allergies  Allergen Reactions  . Amitriptyline     Urinary Retention  . Amlodipine Swelling  . Erythromycin Hives  . Nitrofurantoin Nausea Only  . Sulfa Antibiotics     "really ill"  . Tavist [[place]]     Urinary hesitancy  . Trimethoprim     Cramps  . Verapamil     Constipation    Outpatient Encounter Medications as of 11/23/2017  Medication Sig  . acetaminophen (TYLENOL) 325 MG tablet Take 650 mg by mouth every 6 (six) hours as needed for headache.  Marland Kitchen amLODipine (NORVASC) 2.5 MG tablet Take 2.5 mg by mouth daily.   Marland Kitchen atorvastatin (LIPITOR) 10 MG tablet Take 10 mg by mouth daily.  . Calcium-Phosphorus-Vitamin D (CITRACAL CALCIUM GUMMIES PO) Take by mouth. 1 gummy given daily.  . carvedilol (COREG) 6.25 MG tablet Take 1 tablet (6.25 mg total) by mouth 2 (two) times daily with a meal.  . donepezil (ARICEPT) 10 MG tablet Take 10 mg by mouth at bedtime.   . feeding supplement (BOOST / RESOURCE BREEZE) LIQD Take 1 Container by mouth 2 (two) times daily between meals.  . hydrocortisone (ANUSOL-HC) 2.5 % rectal cream Apply rectally 2 times daily  . levothyroxine (SYNTHROID, LEVOTHROID) 50 MCG tablet Take 50 mcg by mouth See  admin instructions. Only on Tuesday, Thursday, Saturday, Sunday.  . levothyroxine (SYNTHROID, LEVOTHROID) 75 MCG tablet Take 75 mcg by mouth. Take one tablet Monday and Friday  . lisinopril (PRINIVIL,ZESTRIL) 10 MG tablet Take 10 mg by mouth daily.  . memantine (NAMENDA) 5 MG tablet Take 1 tablet (5 mg total) by mouth daily.  . QUEtiapine (SEROQUEL) 25 MG tablet Take 12.5 mg by mouth 2 (two) times daily.   Marland Kitchen triamterene-hydrochlorothiazide (MAXZIDE-25) 37.5-25 MG tablet Take 0.5 tablets by mouth daily.    No facility-administered encounter medications on file as of 11/23/2017.     Review of Systems    Constitutional: Negative for appetite change, chills and fever.  HENT: Negative for trouble swallowing.   Respiratory: Negative for shortness of breath.   Cardiovascular: Negative for chest pain and palpitations.  Gastrointestinal: Negative for abdominal pain, anal bleeding, blood in stool, constipation, diarrhea, nausea, rectal pain and vomiting.       Denies heartburn, straining with bowel movement or hard stool.   Genitourinary: Negative for dysuria and hematuria.  Neurological: Negative for dizziness and headaches.  Psychiatric/Behavioral: Negative for confusion.    Immunization History  Administered Date(s) Administered  . Influenza-Unspecified 07/19/2017  . PPD Test 09/05/2016   Pertinent  Health Maintenance Due  Topic Date Due  . DEXA SCAN  04/28/1992  . PNA vac Low Risk Adult (1 of 2 - PCV13) 04/28/1992  . INFLUENZA VACCINE  Completed   Fall Risk  10/14/2017 01/28/2016  Falls in the past year? Yes No  Number falls in past yr: 1 -  Injury with Fall? Yes -   Functional Status Survey:    Vitals:   11/23/17 0922  BP: 124/68  Pulse: 74  Resp: 16  Temp: (!) 97.4 F (36.3 C)  TempSrc: Oral  SpO2: 99%  Weight: 127 lb 12.8 oz (58 kg)  Height: 5\' 6"  (1.676 m)   Body mass index is 20.63 kg/m.   Wt Readings from Last 3 Encounters:  11/23/17 127 lb 12.8 oz (58 kg)  10/21/17 125 lb 3.2 oz (56.8 kg)  10/14/17 120 lb (54.4 kg)   Physical Exam  Constitutional: She is oriented to person, place, and time.  Thin built, elderly female in no acute distress  HENT:  Head: Normocephalic and atraumatic.  Mouth/Throat: Oropharynx is clear and moist.  Eyes: Conjunctivae and EOM are normal. Pupils are equal, round, and reactive to light. Right eye exhibits no discharge. Left eye exhibits no discharge.  Neck: Neck supple.  Cardiovascular: Normal rate and regular rhythm.  Pulmonary/Chest: Effort normal and breath sounds normal.  Abdominal: Soft. Bowel sounds are normal. She  exhibits no distension. There is no tenderness. There is no rebound and no guarding.  Genitourinary:  Genitourinary Comments: External and one prolapsed internal hemorrhoid with mild erythema and denuded skin, no active bleed, no blood on exam finger, no palpable mass or nodule on rectal exam  Musculoskeletal: Normal range of motion.  Lymphadenopathy:    She has no cervical adenopathy.  Neurological: She is alert and oriented to person, place, and time.  Skin: Skin is warm and dry.  Psychiatric: She has a normal mood and affect.    Labs reviewed: Recent Labs    12/08/16 05/27/17 06/29/17  NA 136* 137 137  K 4.5 4.2 4.7  BUN 27* 29* 33*  CREATININE  --  1.0 1.1   Recent Labs    05/27/17 06/29/17  AST 17 24  ALT 12 29  ALKPHOS 59 82  Recent Labs    05/27/17 06/29/17  WBC 8.1 7.6  HGB 11.6* 12.7  HCT 35* 38  PLT 250 265   Lab Results  Component Value Date   TSH 4.43 05/27/2017   Lab Results  Component Value Date   HGBA1C 5.7 09/30/2017   Lab Results  Component Value Date   CHOL 201 (A) 07/20/2017   HDL 63 07/20/2017   LDLCALC 123 07/20/2017   TRIG 59 07/20/2017    Significant Diagnostic Results in last 30 days:  No results found.  Assessment/Plan  1. Rectal bleed Resolved at present. Likely from hemorrhoids. Check cbc.   2. Internal and external prolapsed hemorrhoids Denies pain or itching. No active bleed. Denies hard stool or straining with bowel movement. Supportive care for now with hydrocortisone cream bid.  Reviewed note from ED visit.     Family/ staff Communication: reviewed care plan with patient and charge nurse.    Labs/tests ordered:  cbc  Blanchie Serve, MD Internal Medicine [facility] [address] [place], [place] [address] Cell Phone (Monday-Friday 8 am - 5 pm): [phone] On Call: [phone] and follow prompts after 5 pm and on weekends Office Phone: [phone] Office Fax: [phone]

## 2017-11-25 LAB — CBC AND DIFFERENTIAL
HCT: 33 — AB (ref 36–46)
Hemoglobin: 11.2 — AB (ref 12.0–16.0)
Platelets: 234 (ref 150–399)
WBC: 6.1

## 2017-11-26 ENCOUNTER — Other Ambulatory Visit: Payer: Self-pay | Admitting: *Deleted

## 2017-12-15 ENCOUNTER — Other Ambulatory Visit: Payer: Self-pay | Admitting: *Deleted

## 2017-12-15 ENCOUNTER — Encounter (HOSPITAL_COMMUNITY): Payer: Self-pay | Admitting: Emergency Medicine

## 2017-12-15 ENCOUNTER — Other Ambulatory Visit: Payer: Self-pay

## 2017-12-15 DIAGNOSIS — W1830XA Fall on same level, unspecified, initial encounter: Secondary | ICD-10-CM | POA: Insufficient documentation

## 2017-12-15 DIAGNOSIS — Z79899 Other long term (current) drug therapy: Secondary | ICD-10-CM | POA: Diagnosis not present

## 2017-12-15 DIAGNOSIS — G309 Alzheimer's disease, unspecified: Secondary | ICD-10-CM | POA: Insufficient documentation

## 2017-12-15 DIAGNOSIS — R42 Dizziness and giddiness: Secondary | ICD-10-CM | POA: Diagnosis not present

## 2017-12-15 DIAGNOSIS — S79912A Unspecified injury of left hip, initial encounter: Secondary | ICD-10-CM | POA: Diagnosis not present

## 2017-12-15 DIAGNOSIS — R404 Transient alteration of awareness: Secondary | ICD-10-CM | POA: Diagnosis not present

## 2017-12-15 DIAGNOSIS — Y939 Activity, unspecified: Secondary | ICD-10-CM | POA: Insufficient documentation

## 2017-12-15 DIAGNOSIS — M25552 Pain in left hip: Secondary | ICD-10-CM | POA: Diagnosis not present

## 2017-12-15 DIAGNOSIS — S79922A Unspecified injury of left thigh, initial encounter: Secondary | ICD-10-CM | POA: Diagnosis not present

## 2017-12-15 DIAGNOSIS — S32502A Unspecified fracture of left pubis, initial encounter for closed fracture: Secondary | ICD-10-CM | POA: Diagnosis not present

## 2017-12-15 DIAGNOSIS — S32412A Displaced fracture of anterior wall of left acetabulum, initial encounter for closed fracture: Secondary | ICD-10-CM | POA: Insufficient documentation

## 2017-12-15 DIAGNOSIS — I10 Essential (primary) hypertension: Secondary | ICD-10-CM | POA: Insufficient documentation

## 2017-12-15 DIAGNOSIS — I4891 Unspecified atrial fibrillation: Secondary | ICD-10-CM | POA: Insufficient documentation

## 2017-12-15 DIAGNOSIS — S32512A Fracture of superior rim of left pubis, initial encounter for closed fracture: Secondary | ICD-10-CM | POA: Diagnosis not present

## 2017-12-15 DIAGNOSIS — Y999 Unspecified external cause status: Secondary | ICD-10-CM | POA: Insufficient documentation

## 2017-12-15 DIAGNOSIS — M79652 Pain in left thigh: Secondary | ICD-10-CM | POA: Diagnosis not present

## 2017-12-15 DIAGNOSIS — Z043 Encounter for examination and observation following other accident: Secondary | ICD-10-CM | POA: Diagnosis present

## 2017-12-15 DIAGNOSIS — Y92129 Unspecified place in nursing home as the place of occurrence of the external cause: Secondary | ICD-10-CM | POA: Insufficient documentation

## 2017-12-15 MED ORDER — ATORVASTATIN CALCIUM 10 MG PO TABS: 10.0000 mg | ORAL_TABLET | Freq: Every day | ORAL | 0 refills | Status: DC

## 2017-12-15 NOTE — ED Triage Notes (Signed)
Pt BIB EMS from [facility] s/p fall. Fall was unwitnessed, found patient on floor. Pt hx of Alzheimer's and anxiety. Patient complaining of upper posterior thigh pain. Patient was on the bed when EMS arrived. Patient is at baseline. Patient was able to stand but she would not bear weight on the leg. No shortening or rotation noted. Patient states no hip pain. Patient is anxious on arrival. No blood thinners or head or neck pain.

## 2017-12-15 NOTE — Telephone Encounter (Signed)
[facility] requesting 90 day supply

## 2017-12-15 NOTE — ED Provider Notes (Signed)
[facility] DEPT Provider Note   CSN: [id number] Arrival date & time: 12/15/17  2323     History   Chief Complaint Chief Complaint  Patient presents with  . Fall    HPI Jamie Thompson is a 84 y.o. female.  The history is provided by the nursing home and the patient. The history is limited by the condition of the patient (Dementia).  She has history of hypertension, hyperlipidemia, atrial fibrillation not currently anticoagulated and comes in following a fall at her assisted living facility.  She injured her left hip and/or pelvis and has not been able to bear weight.  She denies head injury or loss of consciousness.  Past Medical History:  Diagnosis Date  . Adenomatous colon polyp 07/17/97  . Allergic rhinitis   . Allergy   . Anxiety disorder   . Atrial fibrillation ([place])   . Chronic anticoagulation   . Diverticulosis   . History of shingles   . Hypercholesteremia   . Hypertension   . Hypothyroidism   . Internal hemorrhoids   . Melanoma ([place])   . Memory loss   . Meniere disease   . Tension headache     Patient Active Problem List   Diagnosis Date Noted  . Hyperlipidemia 07/21/2017  . Alzheimer's dementia with behavioral disturbance 07/15/2017  . Dementia in Alzheimer's disease with depression 06/07/2017  . Hypothyroidism 11/05/2016  . HTN (hypertension) 11/05/2016  . Atrial fibrillation ([place]) 06/01/2016  . Dementia 06/01/2016  . Internal hemorrhoid 07/19/2015    Past Surgical History:  Procedure Laterality Date  . CATARACT EXTRACTION Right 2012    OB History    No data available       Home Medications    Prior to Admission medications   Medication Sig Start Date End Date Taking? Authorizing Provider  acetaminophen (TYLENOL) 325 MG tablet Take 650 mg by mouth every 6 (six) hours as needed for headache.    [provider]  amLODipine (NORVASC) 2.5 MG tablet Take 2.5 mg by mouth daily.  07/10/16   [provider]  atorvastatin (LIPITOR) 10 MG tablet Take 1 tablet (10 mg total) by mouth daily. 12/15/17   Blanchie Serve, MD  Calcium-Phosphorus-Vitamin D (CITRACAL CALCIUM GUMMIES PO) Take by mouth. 1 gummy given daily.    [provider]  carvedilol (COREG) 6.25 MG tablet Take 1 tablet (6.25 mg total) by mouth 2 (two) times daily with a meal. 06/03/16   Eugenie Filler, MD  donepezil (ARICEPT) 10 MG tablet Take 10 mg by mouth at bedtime.  07/09/16   [provider]  feeding supplement (BOOST / RESOURCE BREEZE) LIQD Take 1 Container by mouth 2 (two) times daily between meals.    [provider]  hydrocortisone (ANUSOL-HC) 2.5 % rectal cream Apply rectally 2 times daily 11/20/17   Milton Ferguson, MD  levothyroxine (SYNTHROID, LEVOTHROID) 50 MCG tablet Take 50 mcg by mouth See admin instructions. Only on Tuesday, Thursday, Saturday, Sunday.    [provider]  levothyroxine (SYNTHROID, LEVOTHROID) 75 MCG tablet Take 75 mcg by mouth. Take one tablet Monday and Friday    [provider]  lisinopril (PRINIVIL,ZESTRIL) 10 MG tablet Take 10 mg by mouth daily.    [provider]  memantine (NAMENDA) 5 MG tablet Take 1 tablet (5 mg total) by mouth daily. 06/10/17   [name], MD  QUEtiapine (SEROQUEL) 25 MG tablet Take 12.5 mg by mouth 2 (two) times daily.  [provider]  triamterene-hydrochlorothiazide (MAXZIDE-25) 37.5-25 MG tablet Take 0.5 tablets by mouth daily.  10/29/15   [provider]    Family History Family History  Problem Relation Age of Onset  . Stroke Father   . Clotting disorder Father   . Colon cancer Mother   . Colon cancer Sister   . Clotting disorder Sister     Social History Social History   Tobacco Use  . Smoking status: Never Smoker  . Smokeless tobacco: Never Used  Substance Use Topics  . Alcohol use: No    Alcohol/week: 0.0 oz  . Drug use: No     Allergies   Amitriptyline; Amlodipine;  Erythromycin; Nitrofurantoin; Sulfa antibiotics; Tavist [clemastine]; Trimethoprim; and Verapamil   Review of Systems Review of Systems  Unable to perform ROS: Dementia     Physical Exam Updated Vital Signs BP (!) 148/63 (BP Location: Left Arm)   Pulse 88   Temp (!) 97.5 F (36.4 C) (Oral)   Resp (!) 22   SpO2 100%   Physical Exam  Nursing note and vitals reviewed.  84 year old female, resting comfortably and in no acute distress. Vital signs are significant for mildly elevated systolic blood pressure and elevated respiratory rate. Oxygen saturation is 100%, which is normal. Head is normocephalic and atraumatic. PERRLA, EOMI. Oropharynx is clear. Neck is nontender and supple without adenopathy or JVD. Back is nontender and there is no CVA tenderness. Lungs are clear without rales, wheezes, or rhonchi. Chest is nontender. Heart has regular rate and rhythm without murmur. Abdomen is soft, flat, nontender without masses or hepatosplenomegaly and peristalsis is normoactive. Extremities: Mild tenderness to palpation over lateral aspect of the left hip.  No obvious deformity or rotation.  No pain elicited with passive range of motion. Skin is warm and dry without rash. Neurologic: She is awake and alert, oriented to person, not oriented to place, partly oriented to time (knows the year is 2019), cranial nerves are intact, there are no motor or sensory deficits.  ED Treatments / Results   Radiology Ct Pelvis Wo Contrast  Result Date: 12/16/2017 CLINICAL DATA:  Fall at nursing home.  Thigh pain. EXAM: CT PELVIS WITHOUT CONTRAST CT OF THE LEFT HIP WITHOUT CONTRAST TECHNIQUE: Multidetector CT imaging of the left hip was performed according to the standard protocol. Multiplanar CT image reconstructions were also generated. COMPARISON:  None. FINDINGS: LEFT HIP: Bones/Joint/Cartilage Minimally displaced fracture of the right superior pubic ramus approaching the pubic body. Minimally  displaced fracture mid inferior pubic ramus. The included acetabulum and proximal femur are intact. No joint effusion. Mild osteoarthritis with osteophytes and subchondral cystic change. Ligaments Suboptimally assessed by CT. Muscles and Tendons No intramuscular hematoma. Soft tissues Mild soft tissue edema laterally. PELVIS: Urinary tract: Urinary bladder is distended without wall thickening. Bowel: No acute finding.  Diverticulosis without diverticulitis. Vascular/lymphatic: Advanced aortic and iliac atherosclerosis. No adenopathy. Reproductive: Normal atrophic uterus for age.  No adnexal mass. Other: No pelvic free fluid. Musculoskeletal: Bones are diffusely under mineralized. No displaced sacral fracture. Sacroiliac joints are congruent. Left hip described on dedicated field of view imaging. IMPRESSION: Left hip: Minimally displaced fractures of superior and inferior pubic rami. Pelvis: No additional fracture of the bony pelvis. No evidence of sacral fracture, however diffuse bony under mineralization limits assessment. Electronically Signed   By: Jeb Levering M.D.   On: 12/16/2017 04:08   Ct Hip Left Wo Contrast  Result Date: 12/16/2017 CLINICAL DATA:  Fall at nursing  home.  Thigh pain. EXAM: CT PELVIS WITHOUT CONTRAST CT OF THE LEFT HIP WITHOUT CONTRAST TECHNIQUE: Multidetector CT imaging of the left hip was performed according to the standard protocol. Multiplanar CT image reconstructions were also generated. COMPARISON:  None. FINDINGS: LEFT HIP: Bones/Joint/Cartilage Minimally displaced fracture of the right superior pubic ramus approaching the pubic body. Minimally displaced fracture mid inferior pubic ramus. The included acetabulum and proximal femur are intact. No joint effusion. Mild osteoarthritis with osteophytes and subchondral cystic change. Ligaments Suboptimally assessed by CT. Muscles and Tendons No intramuscular hematoma. Soft tissues Mild soft tissue edema laterally. PELVIS: Urinary  tract: Urinary bladder is distended without wall thickening. Bowel: No acute finding.  Diverticulosis without diverticulitis. Vascular/lymphatic: Advanced aortic and iliac atherosclerosis. No adenopathy. Reproductive: Normal atrophic uterus for age.  No adnexal mass. Other: No pelvic free fluid. Musculoskeletal: Bones are diffusely under mineralized. No displaced sacral fracture. Sacroiliac joints are congruent. Left hip described on dedicated field of view imaging. IMPRESSION: Left hip: Minimally displaced fractures of superior and inferior pubic rami. Pelvis: No additional fracture of the bony pelvis. No evidence of sacral fracture, however diffuse bony under mineralization limits assessment. Electronically Signed   By: Jeb Levering M.D.   On: 12/16/2017 04:08   Dg Hip Unilat With Pelvis 2-3 Views Left  Result Date: 12/16/2017 CLINICAL DATA:  Unwitnessed fall at nursing home this evening. Left hip and leg pain. EXAM: DG HIP (WITH OR WITHOUT PELVIS) 2-3V LEFT COMPARISON:  None. FINDINGS: Technically limited due to difficulty with positioning. No visualized fracture. Femoral head is seated. Pubic rami are grossly intact but not well assessed. Patient is rotated. IMPRESSION: Technically limited evaluation due to positioning without evidence of acute fracture. If there is persistent clinical concern for fracture, recommend further evaluation with MRI or CT. Electronically Signed   By: Jeb Levering M.D.   On: 12/16/2017 00:48   Dg Femur 1v Left  Result Date: 12/16/2017 CLINICAL DATA:  Unwitnessed fall at nursing home this evening. Left hip and leg pain. EXAM: LEFT FEMUR 1 VIEW COMPARISON:  None.  Concurrent hyper radiograph. FINDINGS: Technically limited exam due to positioning. No evidence of femur fracture. Knee alignment is not well assessed. Proximal femur included on concurrent hip radiographs. There are vascular calcifications. IMPRESSION: No evidence of femur fracture on limited exam due to  difficulty with positioning. Electronically Signed   By: Jeb Levering M.D.   On: 12/16/2017 00:47    Procedures Procedures   Medications Ordered in ED Medications - No data to display   Initial Impression / Assessment and Plan / ED Course  I have reviewed the triage vital signs and the nursing notes.  Pertinent labs & imaging results that were available during my care of the patient were reviewed by me and considered in my medical decision making (see chart for details).  Fall with pain in the left hip area with inability to bear weight.  She will be sent for x-rays.  Old records are reviewed, and she has prior hospital admission for fall 1 year ago, at which time anticoagulation was stopped because of falls.  X-rays are negative for fracture.  Given her inability to ambulate, she is sent for a CT scan to look for occult fracture.  CT shows presence of fractures in the left hemipelvis.  She currently is in an assisted living facility, but skilled nursing is available at the same facility.  She will be held in the ED to have social service help arrange the  transfer.  Final Clinical Impressions(s) / ED Diagnoses   Final diagnoses:  Fall at home, initial encounter  Closed fracture of left pelvis, initial encounter [facility])    ED Discharge Orders    None       Delora Fuel, MD 68/25/74 [phone]

## 2017-12-16 ENCOUNTER — Emergency Department (HOSPITAL_COMMUNITY): Payer: Medicare Other

## 2017-12-16 ENCOUNTER — Emergency Department (HOSPITAL_COMMUNITY)
Admission: EM | Admit: 2017-12-16 | Discharge: 2017-12-16 | Disposition: A | Discharge status: Home / Self Care | Payer: Medicare Other | Attending: Emergency Medicine | Admitting: Emergency Medicine

## 2017-12-16 DIAGNOSIS — R52 Pain, unspecified: Secondary | ICD-10-CM

## 2017-12-16 DIAGNOSIS — Y92009 Unspecified place in unspecified non-institutional (private) residence as the place of occurrence of the external cause: Secondary | ICD-10-CM

## 2017-12-16 DIAGNOSIS — S79922A Unspecified injury of left thigh, initial encounter: Secondary | ICD-10-CM | POA: Diagnosis not present

## 2017-12-16 DIAGNOSIS — G8911 Acute pain due to trauma: Secondary | ICD-10-CM | POA: Diagnosis not present

## 2017-12-16 DIAGNOSIS — M25552 Pain in left hip: Secondary | ICD-10-CM | POA: Diagnosis not present

## 2017-12-16 DIAGNOSIS — S32412A Displaced fracture of anterior wall of left acetabulum, initial encounter for closed fracture: Secondary | ICD-10-CM | POA: Diagnosis not present

## 2017-12-16 DIAGNOSIS — S79912A Unspecified injury of left hip, initial encounter: Secondary | ICD-10-CM | POA: Diagnosis not present

## 2017-12-16 DIAGNOSIS — S32502A Unspecified fracture of left pubis, initial encounter for closed fracture: Secondary | ICD-10-CM | POA: Diagnosis not present

## 2017-12-16 DIAGNOSIS — S329XXA Fracture of unspecified parts of lumbosacral spine and pelvis, initial encounter for closed fracture: Secondary | ICD-10-CM

## 2017-12-16 DIAGNOSIS — S3993XA Unspecified injury of pelvis, initial encounter: Secondary | ICD-10-CM | POA: Diagnosis not present

## 2017-12-16 DIAGNOSIS — W19XXXA Unspecified fall, initial encounter: Secondary | ICD-10-CM

## 2017-12-16 DIAGNOSIS — M79652 Pain in left thigh: Secondary | ICD-10-CM | POA: Diagnosis not present

## 2017-12-16 NOTE — ED Notes (Signed)
Report given to [facility]

## 2017-12-16 NOTE — Progress Notes (Addendum)
11:26am- CSW spoke with Jamie Thompson (pt's daughter) and was informed that they are planning to take the bed at [facility] on the [place] unit at this time. CSW spoke with Joellen Jersey from [facility] and was informed that she  has bed ready for pt a this time. CSW has updated RN and MD of this at this time. RN to set up [facility] for transport. RN to call number to [phone]. There are no further CSW needs at this time . CSW signing off.   11:05am-CSW spoke with daughter Jamie Thompson and was informed that either way pt will be returning to [facility] today whether it be the bed that Joellen Jersey has to offer or back to ALF. CSW explained to family that pt is ready to discharge so CSW needs to have a plan in place sooner than later. Daughter was informed by CSW that per Joellen Jersey no matter where pt goes for SNF family would have to pay out of pocket as pt has traditional  Medicare which required a three night inpt stay. CSW waiting for call back from Roe at this time. If no call back by 1pm. CSW to follow up with family for decision.   10:59am- CSW spoke Albina Billet from [facility] and was informed that Josem Kaufmann is [phone]. CSW has updated Vivien Rota at [facility] at this time.   10:46am- CSW receive call from pt's other daughter Jamie Thompson. She sought informtion on whether this would be a permanent  placement for pt or not. CSW informed her that CSW was not able to answer that because CSW isnt sure how well pt will or will not progress while this care. CSW provided daughter with the for Joellen Jersey at [facility] so that she can answer any questions needed at this time. Roe to call CSW back with an update on whether they want to take bed at [facility] or not. CSW will explain to family that if they choose another facility then they will have to pay out of pocket as pt has not has 3 nigh inpt  qualifying stay on this admission or within the last 30 days.   10:20am-CSW spoke with Joellen Jersey from [facility] and was informed that they have a [place] bed  available to pt at this time. Katie asked that CSW speak with family and informed them that if they would like this room they would have to pay out of pockets ($286) a day due to it not being a medicaid or medicare bed. CSW has spoken with pt's daughter Jamie Thompson and informed her of this. She mentioned that she would call CSW back after speaking with sister about this. CSW will continue to follow for needs.   CSW acknowledges consult and understanding that pt is from [facility] CSW had reached out to Ki[place]ha [place] at [facility] to see about having pt switched over to the SNF part of the facility. CSW has left two VM asking that Katie call CSW back at this tome. CSW still following for needs.    Virgie Dad Gurman [place], MSW, [facility] Clinical Social Worker [phone]

## 2017-12-16 NOTE — [place] FL2 (Signed)
[place] MEDICAID FL2 LEVEL OF CARE SCREENING TOOL     IDENTIFICATION  Patient Name: Jamie Thompson Birthdate: 1925-02-27 Sex: female Admission Date (Current Location): 12/16/2017  [facility] and [place] Number:  Herbalist and Address:  [facility], [place] [address], [place], [place] [address]      Provider Number: [id number]  Attending Physician Name and Address:  Davonna Belling, MD  Relative Name and Phone Number:       Current Level of Care: Hospital Recommended Level of Care: Memory Care, [place] Prior Approval Number:    Date Approved/Denied:   PASRR Number:   [id number]   Discharge Plan:      Current Diagnoses: Patient Active Problem List   Diagnosis Date Noted  . Hyperlipidemia 07/21/2017  . Alzheimer's dementia with behavioral disturbance 07/15/2017  . Dementia in Alzheimer's disease with depression 06/07/2017  . Hypothyroidism 11/05/2016  . HTN (hypertension) 11/05/2016  . Atrial fibrillation ([place]) 06/01/2016  . Dementia 06/01/2016  . Internal hemorrhoid 07/19/2015    Orientation RESPIRATION BLADDER Height & Weight     (pt has dementia. )  Normal Incontinent Weight:   Height:     BEHAVIORAL SYMPTOMS/MOOD NEUROLOGICAL BOWEL NUTRITION STATUS      Incontinent Diet(please see discharge summary. )  AMBULATORY STATUS COMMUNICATION OF NEEDS Skin   Extensive Assist Verbally Normal(normal )                       Personal Care Assistance Level of Assistance  Feeding, Dressing, Bathing Bathing Assistance: Maximum assistance Feeding assistance: Maximum assistance Dressing Assistance: Maximum assistance     Functional Limitations Info  Sight, Hearing, Speech Sight Info: Adequate Hearing Info: Adequate Speech Info: Adequate    SPECIAL CARE FACTORS FREQUENCY  PT (By licensed PT), OT (By licensed OT)     PT Frequency: 5 times a week  OT Frequency: 5 times a week             Contractures  Contractures Info: Not present    Additional Factors Info  Code Status, Allergies Code Status Info: Prior Allergies Info: Amitriptyline, Amlodipine, Erythromycin, Nitrofurantoin, Sudafed Pseudoephedrine Hcl, Sulfa Antibiotics, Tavist Clemastine, Trimethoprim, Verapamil           Current Medications (12/16/2017):  This is the current hospital active medication list No current facility-administered medications for this encounter.    Current Outpatient Medications  Medication Sig Dispense Refill  . amLODipine (NORVASC) 2.5 MG tablet Take 2.5 mg by mouth daily.     Marland Kitchen atorvastatin (LIPITOR) 10 MG tablet Take 1 tablet (10 mg total) by mouth daily. 90 tablet 0  . Calcium-Phosphorus-Vitamin D (CITRACAL CALCIUM GUMMIES PO) Take 1 each by mouth daily.     . carvedilol (COREG) 6.25 MG tablet Take 1 tablet (6.25 mg total) by mouth 2 (two) times daily with a meal. 62 tablet 3  . donepezil (ARICEPT) 10 MG tablet Take 10 mg by mouth at bedtime.     . feeding supplement (BOOST / RESOURCE BREEZE) LIQD Take 1 Container by mouth 2 (two) times daily between meals. Prefers Vanilla flavored    . hydrocortisone (ANUSOL-HC) 2.5 % rectal cream Apply rectally 2 times daily (Patient taking differently: Place 1 application rectally 2 (two) times daily as needed for hemorrhoids. ) 28.35 g 0  . levothyroxine (SYNTHROID, LEVOTHROID) 50 MCG tablet Take 50 mcg by mouth See admin instructions. Only on Tuesday, Thursday, Saturday, Sunday.    . levothyroxine (  SYNTHROID, LEVOTHROID) 75 MCG tablet Take 75 mcg by mouth 2 (two) times a week. Take one tablet Monday and Friday     . lisinopril (PRINIVIL,ZESTRIL) 10 MG tablet Take 10 mg by mouth daily.    . memantine (NAMENDA) 5 MG tablet Take 1 tablet (5 mg total) by mouth daily. 30 tablet 5  . QUEtiapine (SEROQUEL) 25 MG tablet Take 12.5 mg by mouth 2 (two) times daily.     Marland Kitchen triamterene-hydrochlorothiazide (MAXZIDE-25) 37.5-25 MG tablet Take 0.5 tablets by mouth daily.     Marland Kitchen  acetaminophen (TYLENOL) 325 MG tablet Take 650 mg by mouth every 6 (six) hours as needed for headache.       Discharge Medications: Please see discharge summary for a list of discharge medications.  Relevant Imaging Results:  Relevant Lab Results:   Additional Information [phone].  Wetzel Bjornstad, LCSWA

## 2017-12-16 NOTE — ED Provider Notes (Signed)
  Physical Exam  BP 101/71   Pulse 78   Temp (!) 97.5 F (36.4 C) (Oral)   Resp 17   SpO2 90%   Physical Exam  ED Course/Procedures     Procedures  MDM  Patient has been seen by social work and [facility].  Will discharge.       Davonna Belling, MD 12/16/17 1148

## 2017-12-17 DIAGNOSIS — Z7389 Other problems related to life management difficulty: Secondary | ICD-10-CM | POA: Diagnosis not present

## 2017-12-17 DIAGNOSIS — M25552 Pain in left hip: Secondary | ICD-10-CM | POA: Diagnosis not present

## 2017-12-17 DIAGNOSIS — R2689 Other abnormalities of gait and mobility: Secondary | ICD-10-CM | POA: Diagnosis not present

## 2017-12-17 DIAGNOSIS — H8109 Meniere's disease, unspecified ear: Secondary | ICD-10-CM | POA: Diagnosis not present

## 2017-12-17 DIAGNOSIS — S32502D Unspecified fracture of left pubis, subsequent encounter for fracture with routine healing: Secondary | ICD-10-CM | POA: Diagnosis not present

## 2017-12-17 DIAGNOSIS — K219 Gastro-esophageal reflux disease without esophagitis: Secondary | ICD-10-CM | POA: Diagnosis not present

## 2017-12-17 DIAGNOSIS — E039 Hypothyroidism, unspecified: Secondary | ICD-10-CM | POA: Diagnosis not present

## 2017-12-17 DIAGNOSIS — S2242XD Multiple fractures of ribs, left side, subsequent encounter for fracture with routine healing: Secondary | ICD-10-CM | POA: Diagnosis not present

## 2017-12-17 DIAGNOSIS — R278 Other lack of coordination: Secondary | ICD-10-CM | POA: Diagnosis not present

## 2017-12-17 DIAGNOSIS — R2681 Unsteadiness on feet: Secondary | ICD-10-CM | POA: Diagnosis not present

## 2017-12-17 DIAGNOSIS — R29898 Other symptoms and signs involving the musculoskeletal system: Secondary | ICD-10-CM | POA: Diagnosis not present

## 2017-12-17 DIAGNOSIS — K5793 Diverticulitis of intestine, part unspecified, without perforation or abscess with bleeding: Secondary | ICD-10-CM | POA: Diagnosis not present

## 2017-12-17 DIAGNOSIS — Z9181 History of falling: Secondary | ICD-10-CM | POA: Diagnosis not present

## 2017-12-17 DIAGNOSIS — N390 Urinary tract infection, site not specified: Secondary | ICD-10-CM | POA: Diagnosis not present

## 2017-12-17 DIAGNOSIS — R41841 Cognitive communication deficit: Secondary | ICD-10-CM | POA: Diagnosis not present

## 2017-12-17 DIAGNOSIS — M542 Cervicalgia: Secondary | ICD-10-CM | POA: Diagnosis not present

## 2017-12-17 DIAGNOSIS — Z7901 Long term (current) use of anticoagulants: Secondary | ICD-10-CM | POA: Diagnosis not present

## 2017-12-17 DIAGNOSIS — M6281 Muscle weakness (generalized): Secondary | ICD-10-CM | POA: Diagnosis not present

## 2017-12-17 DIAGNOSIS — S2242XS Multiple fractures of ribs, left side, sequela: Secondary | ICD-10-CM | POA: Diagnosis not present

## 2017-12-20 DIAGNOSIS — M25552 Pain in left hip: Secondary | ICD-10-CM | POA: Diagnosis not present

## 2017-12-20 DIAGNOSIS — N390 Urinary tract infection, site not specified: Secondary | ICD-10-CM | POA: Diagnosis not present

## 2017-12-20 DIAGNOSIS — M6281 Muscle weakness (generalized): Secondary | ICD-10-CM | POA: Diagnosis not present

## 2017-12-20 DIAGNOSIS — R2681 Unsteadiness on feet: Secondary | ICD-10-CM | POA: Diagnosis not present

## 2017-12-20 DIAGNOSIS — S32502D Unspecified fracture of left pubis, subsequent encounter for fracture with routine healing: Secondary | ICD-10-CM | POA: Diagnosis not present

## 2017-12-20 DIAGNOSIS — R278 Other lack of coordination: Secondary | ICD-10-CM | POA: Diagnosis not present

## 2017-12-20 DIAGNOSIS — Z9181 History of falling: Secondary | ICD-10-CM | POA: Diagnosis not present

## 2017-12-21 ENCOUNTER — Non-Acute Institutional Stay (SKILLED_NURSING_FACILITY): Payer: Medicare Other | Admitting: Internal Medicine

## 2017-12-21 ENCOUNTER — Encounter: Payer: Self-pay | Admitting: Internal Medicine

## 2017-12-21 DIAGNOSIS — S32592S Other specified fracture of left pubis, sequela: Secondary | ICD-10-CM

## 2017-12-21 DIAGNOSIS — E039 Hypothyroidism, unspecified: Secondary | ICD-10-CM | POA: Diagnosis not present

## 2017-12-21 DIAGNOSIS — F0281 Dementia in other diseases classified elsewhere with behavioral disturbance: Secondary | ICD-10-CM

## 2017-12-21 DIAGNOSIS — N3000 Acute cystitis without hematuria: Secondary | ICD-10-CM

## 2017-12-21 DIAGNOSIS — R2681 Unsteadiness on feet: Secondary | ICD-10-CM | POA: Diagnosis not present

## 2017-12-21 DIAGNOSIS — I10 Essential (primary) hypertension: Secondary | ICD-10-CM

## 2017-12-21 DIAGNOSIS — E782 Mixed hyperlipidemia: Secondary | ICD-10-CM

## 2017-12-21 DIAGNOSIS — S32502D Unspecified fracture of left pubis, subsequent encounter for fracture with routine healing: Secondary | ICD-10-CM | POA: Diagnosis not present

## 2017-12-21 DIAGNOSIS — R278 Other lack of coordination: Secondary | ICD-10-CM | POA: Diagnosis not present

## 2017-12-21 DIAGNOSIS — G309 Alzheimer's disease, unspecified: Secondary | ICD-10-CM | POA: Diagnosis not present

## 2017-12-21 DIAGNOSIS — M25552 Pain in left hip: Secondary | ICD-10-CM | POA: Diagnosis not present

## 2017-12-21 DIAGNOSIS — Z9181 History of falling: Secondary | ICD-10-CM | POA: Diagnosis not present

## 2017-12-21 DIAGNOSIS — M6281 Muscle weakness (generalized): Secondary | ICD-10-CM | POA: Diagnosis not present

## 2017-12-21 NOTE — Progress Notes (Signed)
Provider:  Blanchie Serve MD  Location:  [place] Room Number: 105 Place of Service:  SNF (31)  PCP: Blanchie Serve, MD Patient Care Team: Blanchie Serve, MD as PCP - General (Internal Medicine) Mast, Man X, NP as Nurse Practitioner (Internal Medicine) [name], MD as Attending Physician (Neurology)  Extended Emergency Contact Information Primary Emergency Contact: Key,Caroline Address: [place] Phone: [phone] Mobile Phone: [phone] Relation: Daughter Secondary Emergency Contact: Key,Tim  [place] Mobile Phone: [phone] Relation: Son  Code Status: DNR  Goals of Care: Advanced Directive information Advanced Directives 12/21/2017  Does Patient Have a Medical Advance Directive? Yes  Type of Paramedic of [place];Living will;Out of facility DNR (pink MOST or yellow form)  Does patient want to make changes to medical advance directive? No - Patient declined  Copy of [place] in Chart? Yes  Would patient like information on creating a medical advance directive? -  Pre-existing out of facility DNR order (yellow form or pink MOST form) -      Chief Complaint  Patient presents with  . New Admit To SNF    New Admission Visit     HPI: Patient is a 84 y.o. female seen today for admission visit. She is an admission from ED. She was residing in ALF prior to this. She went to ED on 12/15/17 post fall with pain to left hip. Xray was unrevealing and CT scan showed left displaced fracture of superior and inferior pubic rami. She has dementia and is confused and this limits her HPI and ROS. She mentions having need to use the bathroom frequently. She is unable to say if she has the need to urinate or move her bowel in particular. She had a temperature spike on 12/18/17 of 99.6 and on call provider had ordered a u/a with c/s to  rule out infection that has resulted partially today. Result reviewed. At present, patient is afebrile. Patient is seen in her room today. She is unable to recall recent ED visit. She is pleasantly confused and this limits her HPI and ROS.   Past Medical History:  Diagnosis Date  . Adenomatous colon polyp 07/17/97  . Allergic rhinitis   . Allergy   . Anxiety disorder   . Atrial fibrillation ([place])   . Chronic anticoagulation   . Diverticulosis   . History of shingles   . Hypercholesteremia   . Hypertension   . Hypothyroidism   . Internal hemorrhoids   . Melanoma ([place])   . Memory loss   . Meniere disease   . Tension headache    Past Surgical History:  Procedure Laterality Date  . CATARACT EXTRACTION Right 2012    reports that she has never smoked. She has never used smokeless tobacco. She reports that she does not drink alcohol or use drugs. Social History   Socioeconomic History  . Marital status: Single    Spouse name: Not on file  . Number of children: 2  . Years of education: 34yr colg  . Highest education level: Not on file  Occupational History  . Occupation: Retired Lexicographer  . Financial resource strain: Not hard at all  . Food insecurity:    Worry: Never true    Inability: Never true  . Transportation needs:    Medical: No    Non-medical: No  Tobacco  Use  . Smoking status: Never Smoker  . Smokeless tobacco: Never Used  Substance and Sexual Activity  . Alcohol use: No    Alcohol/week: 0.0 oz  . Drug use: No  . Sexual activity: Never  Lifestyle  . Physical activity:    Days per week: 0 days    Minutes per session: 0 min  . Stress: Only a little  Relationships  . Social connections:    Talks on phone: Not on file    Gets together: Not on file    Attends religious service: Not on file    Active member of club or organization: Not on file    Attends meetings of clubs or organizations: Not on file    Relationship status: Not on file  .  Intimate partner violence:    Fear of current or ex partner: No    Emotionally abused: No    Physically abused: No    Forced sexual activity: No  Other Topics Concern  . Not on file  Social History Narrative   1 cup of coffee a day, occasionally drinks tea or soda   Social History     Marital status: Widowed             Spouse name:                        Years of education:        Number of children: 2               Occupational History   Occupation : Surveyor, mining            Comment                Retired                                       Social History Main Topics     Smoking status: Never Smoker                                                                  Smokeless tobacco: Never Used                         Alcohol use: No               Drug use: No               Sexual activity: Not on file            Other Topics            Concern     None on file      Social History Narrative     1 cup of coffee a day, occasionally drinks tea or soda.   She does not exercise, has a living will, DNR and POA   Lives at [facility] Status Survey:    Family History  Problem Relation Age of Onset  . Stroke Father   .  Clotting disorder Father   . Colon cancer Mother   . Colon cancer Sister   . Clotting disorder Sister     Health Maintenance  Topic Date Due  . Samul Dada  04/28/1944  . DEXA SCAN  04/28/1992  . PNA vac Low Risk Adult (1 of 2 - PCV13) 04/28/1992  . INFLUENZA VACCINE  Completed    Allergies  Allergen Reactions  . Amitriptyline     Urinary Retention  . Amlodipine Swelling  . Erythromycin Hives  . Nitrofurantoin Nausea Only  . Sudafed [Pseudoephedrine Hcl]     Unknown reaction per MAR   . Sulfa Antibiotics     "really ill"  . Tavist [Clemastine]     Urinary hesitancy  . Trimethoprim     Cramps  . Verapamil     Constipation    Outpatient Encounter Medications as of 12/21/2017  Medication  Sig  . acetaminophen (TYLENOL) 325 MG tablet Take 650 mg by mouth every 6 (six) hours as needed for headache.  Marland Kitchen acetaminophen (TYLENOL) 325 MG tablet Take 650 mg by mouth 3 (three) times daily. Stop date 01/02/18  . atorvastatin (LIPITOR) 10 MG tablet Take 1 tablet (10 mg total) by mouth daily.  . Calcium-Phosphorus-Vitamin D (CITRACAL CALCIUM GUMMIES PO) Take 1 each by mouth daily.   . carvedilol (COREG) 6.25 MG tablet Take 1 tablet (6.25 mg total) by mouth 2 (two) times daily with a meal.  . donepezil (ARICEPT) 10 MG tablet Take 10 mg by mouth at bedtime.   . feeding supplement (BOOST / RESOURCE BREEZE) LIQD Take 1 Container by mouth 2 (two) times daily between meals. Prefers Vanilla flavored  . hydrocortisone (ANUSOL-HC) 2.5 % rectal cream Place 1 application rectally 2 (two) times daily as needed for hemorrhoids.  Marland Kitchen levothyroxine (SYNTHROID, LEVOTHROID) 50 MCG tablet Take 50 mcg by mouth See admin instructions. Only on Tuesday, Thursday, Saturday, Sunday.  . levothyroxine (SYNTHROID, LEVOTHROID) 75 MCG tablet Take 75 mcg by mouth 2 (two) times a week. Take one tablet Monday and Friday   . lisinopril (PRINIVIL,ZESTRIL) 10 MG tablet Take 10 mg by mouth daily.  . memantine (NAMENDA) 5 MG tablet Take 1 tablet (5 mg total) by mouth daily.  . QUEtiapine (SEROQUEL) 25 MG tablet Take 12.5 mg by mouth 2 (two) times daily.   Marland Kitchen triamterene-hydrochlorothiazide (MAXZIDE-25) 37.5-25 MG tablet Take 0.5 tablets by mouth daily.   . [DISCONTINUED] amLODipine (NORVASC) 2.5 MG tablet Take 2.5 mg by mouth daily.   . [DISCONTINUED] hydrocortisone (ANUSOL-HC) 2.5 % rectal cream Apply rectally 2 times daily (Patient taking differently: Place 1 application rectally 2 (two) times daily as needed for hemorrhoids. )   No facility-administered encounter medications on file as of 12/21/2017.     Review of Systems  Unable to perform ROS: Dementia  Constitutional: Negative for appetite change, chills and fever.  HENT:  Negative for congestion, sinus pain and trouble swallowing.        Has hearing aids  Eyes: Positive for visual disturbance.       Has corrective glasses  Respiratory: Negative for cough and shortness of breath.   Cardiovascular: Negative for chest pain and palpitations.  Gastrointestinal: Negative for abdominal pain, blood in stool and vomiting.       Had a small bowel movement this am  Genitourinary: Negative for hematuria.  Musculoskeletal: Positive for gait problem. Negative for back pain.       Pt denies pain, per nursing pt appears in discomfort with movement and during  transfer. Needs 1 person assistance with transfer  Skin: Negative for rash and wound.  Neurological: Negative for dizziness and headaches.  Psychiatric/Behavioral: Positive for behavioral problems, confusion and decreased concentration.    Vitals:   12/21/17 1418  BP: (!) 142/60  Pulse: 89  Resp: 16  Temp: 97.6 F (36.4 C)  TempSrc: Oral  SpO2: 98%   There is no height or weight on file to calculate BMI. Physical Exam  Constitutional: No distress.  Elderly frail female patient, in no acute distress  HENT:  Head: Normocephalic and atraumatic.  Right Ear: External ear normal.  Left Ear: External ear normal.  Nose: Nose normal.  Mouth/Throat: Oropharynx is clear and moist.  Hearing aid to left ear  Eyes: Pupils are equal, round, and reactive to light. Conjunctivae and EOM are normal. Right eye exhibits no discharge. Left eye exhibits no discharge.  Neck: Normal range of motion. Neck supple.  Cardiovascular: Normal rate and regular rhythm.  Pulmonary/Chest: Effort normal and breath sounds normal. No respiratory distress. She has no wheezes. She has no rales.  Abdominal: Soft. Bowel sounds are normal. There is no tenderness. There is no guarding.  Musculoskeletal:  Trace leg edema left > right, unsteady gait, needs 1-2 person assistance with transfer and her ADLs. Ambulates with wheelchair for now. Movement  of left leg illicits pain. Strength 3-4/5 on LLE  Lymphadenopathy:    She has no cervical adenopathy.  Neurological: She is alert.  Oriented to self and place only  Skin: Skin is warm and dry. No rash noted. She is not diaphoretic.  Psychiatric:  Pleasantly confused    Labs reviewed: Basic Metabolic Panel: Recent Labs    05/27/17 06/29/17  NA 137 137  K 4.2 4.7  BUN 29* 33*  CREATININE 1.0 1.1   Liver Function Tests: Recent Labs    05/27/17 06/29/17  AST 17 24  ALT 12 29  ALKPHOS 59 82   No results for input(s): LIPASE, AMYLASE in the last 8760 hours. No results for input(s): AMMONIA in the last 8760 hours. CBC: Recent Labs    05/27/17 06/29/17 11/25/17  WBC 8.1 7.6 6.1  HGB 11.6* 12.7 11.2*  HCT 35* 38 33*  PLT 250 265 234   Cardiac Enzymes: No results for input(s): CKTOTAL, CKMB, CKMBINDEX, TROPONINI in the last 8760 hours. BNP: Invalid input(s): POCBNP Lab Results  Component Value Date   HGBA1C 5.7 09/30/2017   Lab Results  Component Value Date   TSH 4.43 05/27/2017   No results found for: VITAMINB12 No results found for: FOLATE No results found for: IRON, TIBC, FERRITIN  Imaging and Procedures obtained prior to SNF admission: Ct Pelvis Wo Contrast  Result Date: 12/16/2017 CLINICAL DATA:  Fall at nursing home.  Thigh pain. EXAM: CT PELVIS WITHOUT CONTRAST CT OF THE LEFT HIP WITHOUT CONTRAST TECHNIQUE: Multidetector CT imaging of the left hip was performed according to the standard protocol. Multiplanar CT image reconstructions were also generated. COMPARISON:  None. FINDINGS: LEFT HIP: Bones/Joint/Cartilage Minimally displaced fracture of the right superior pubic ramus approaching the pubic body. Minimally displaced fracture mid inferior pubic ramus. The included acetabulum and proximal femur are intact. No joint effusion. Mild osteoarthritis with osteophytes and subchondral cystic change. Ligaments Suboptimally assessed by CT. Muscles and Tendons No  intramuscular hematoma. Soft tissues Mild soft tissue edema laterally. PELVIS: Urinary tract: Urinary bladder is distended without wall thickening. Bowel: No acute finding.  Diverticulosis without diverticulitis. Vascular/lymphatic: Advanced aortic and iliac atherosclerosis. No adenopathy. Reproductive:  Normal atrophic uterus for age.  No adnexal mass. Other: No pelvic free fluid. Musculoskeletal: Bones are diffusely under mineralized. No displaced sacral fracture. Sacroiliac joints are congruent. Left hip described on dedicated field of view imaging. IMPRESSION: Left hip: Minimally displaced fractures of superior and inferior pubic rami. Pelvis: No additional fracture of the bony pelvis. No evidence of sacral fracture, however diffuse bony under mineralization limits assessment. Electronically Signed   By: Jeb Levering M.D.   On: 12/16/2017 04:08   Ct Hip Left Wo Contrast  Result Date: 12/16/2017 CLINICAL DATA:  Fall at nursing home.  Thigh pain. EXAM: CT PELVIS WITHOUT CONTRAST CT OF THE LEFT HIP WITHOUT CONTRAST TECHNIQUE: Multidetector CT imaging of the left hip was performed according to the standard protocol. Multiplanar CT image reconstructions were also generated. COMPARISON:  None. FINDINGS: LEFT HIP: Bones/Joint/Cartilage Minimally displaced fracture of the right superior pubic ramus approaching the pubic body. Minimally displaced fracture mid inferior pubic ramus. The included acetabulum and proximal femur are intact. No joint effusion. Mild osteoarthritis with osteophytes and subchondral cystic change. Ligaments Suboptimally assessed by CT. Muscles and Tendons No intramuscular hematoma. Soft tissues Mild soft tissue edema laterally. PELVIS: Urinary tract: Urinary bladder is distended without wall thickening. Bowel: No acute finding.  Diverticulosis without diverticulitis. Vascular/lymphatic: Advanced aortic and iliac atherosclerosis. No adenopathy. Reproductive: Normal atrophic uterus for age.   No adnexal mass. Other: No pelvic free fluid. Musculoskeletal: Bones are diffusely under mineralized. No displaced sacral fracture. Sacroiliac joints are congruent. Left hip described on dedicated field of view imaging. IMPRESSION: Left hip: Minimally displaced fractures of superior and inferior pubic rami. Pelvis: No additional fracture of the bony pelvis. No evidence of sacral fracture, however diffuse bony under mineralization limits assessment. Electronically Signed   By: Jeb Levering M.D.   On: 12/16/2017 04:08   Dg Hip Unilat With Pelvis 2-3 Views Left  Result Date: 12/16/2017 CLINICAL DATA:  Unwitnessed fall at nursing home this evening. Left hip and leg pain. EXAM: DG HIP (WITH OR WITHOUT PELVIS) 2-3V LEFT COMPARISON:  None. FINDINGS: Technically limited due to difficulty with positioning. No visualized fracture. Femoral head is seated. Pubic rami are grossly intact but not well assessed. Patient is rotated. IMPRESSION: Technically limited evaluation due to positioning without evidence of acute fracture. If there is persistent clinical concern for fracture, recommend further evaluation with MRI or CT. Electronically Signed   By: Jeb Levering M.D.   On: 12/16/2017 00:48   Dg Femur 1v Left  Result Date: 12/16/2017 CLINICAL DATA:  Unwitnessed fall at nursing home this evening. Left hip and leg pain. EXAM: LEFT FEMUR 1 VIEW COMPARISON:  None.  Concurrent hyper radiograph. FINDINGS: Technically limited exam due to positioning. No evidence of femur fracture. Knee alignment is not well assessed. Proximal femur included on concurrent hip radiographs. There are vascular calcifications. IMPRESSION: No evidence of femur fracture on limited exam due to difficulty with positioning. Electronically Signed   By: Jeb Levering M.D.   On: 12/16/2017 00:47    Assessment/Plan  1. Acute cystitis without hematuria Reviewed u/a with culture. Positive for >100,000 colonies of gram negative bacilli. Start  ceftin 250 mg bid x 1 week with florastor, f/u on sensitivity report. History of e.coli UTI in past. Allergies reviewed. Maintain hydration.   2. Mixed hyperlipidemia Continue atorvastatin for now  3. Closed fracture of multiple pubic rami, left, sequela Will have patient work with PT/OT as tolerated to regain strength and restore function.  Fall precautions are  in place. add meloxicam 7.5 daily for pain. Change tylenol from 650 mg tid to 1000 mg tid. D/c citracal gummies and add calcium and vitamin d 600-400 iu bid for now.   4. Unsteady gait Will have her work with physical therapy and occupational therapy team to help with gait training and muscle strengthening exercises.fall precautions. Skin care. Encourage to be out of bed.   5. Hypothyroidism, unspecified type Lab Results  Component Value Date   TSH 4.43 05/27/2017   Check TSH. Continue current levothyroxine regimen  6. Essential hypertension Continue lisinopril 10 mg daily and coreg 6.25 mg bid. D/c maxzide. Monitor BP and HR for now.  7. Alzheimer's dementia with behavioral disturbance, unspecified timing of dementia onset Supportive care, in memory care unit, fall precautions, seroquel 12.5 mg bid for behavioral issues. Continue donepezil 10 mg daily with memantine 5 mg daily. SLP consult.  Family/ staff Communication: reviewed care plan with patient and charge nurse.    Labs/tests ordered: cbc with diff, cmp, tsh, lipid, vit D  Blanchie Serve, MD Internal Medicine [facility] [place],  [id number] Cell Phone (Monday-Friday 8 am - 5 pm): [phone] On Call: [phone] and follow prompts after 5 pm and on weekends Office Phone: [phone] Office Fax: [phone]   Urine culture has resulted with E.coli sensitive to cephalosporin  Drug group. Continue ceftin.

## 2017-12-22 DIAGNOSIS — R2681 Unsteadiness on feet: Secondary | ICD-10-CM | POA: Diagnosis not present

## 2017-12-22 DIAGNOSIS — M25552 Pain in left hip: Secondary | ICD-10-CM | POA: Diagnosis not present

## 2017-12-22 DIAGNOSIS — R278 Other lack of coordination: Secondary | ICD-10-CM | POA: Diagnosis not present

## 2017-12-22 DIAGNOSIS — Z9181 History of falling: Secondary | ICD-10-CM | POA: Diagnosis not present

## 2017-12-22 DIAGNOSIS — S32502D Unspecified fracture of left pubis, subsequent encounter for fracture with routine healing: Secondary | ICD-10-CM | POA: Diagnosis not present

## 2017-12-22 DIAGNOSIS — M6281 Muscle weakness (generalized): Secondary | ICD-10-CM | POA: Diagnosis not present

## 2017-12-23 DIAGNOSIS — E039 Hypothyroidism, unspecified: Secondary | ICD-10-CM | POA: Diagnosis not present

## 2017-12-23 DIAGNOSIS — E559 Vitamin D deficiency, unspecified: Secondary | ICD-10-CM | POA: Diagnosis not present

## 2017-12-23 DIAGNOSIS — M25552 Pain in left hip: Secondary | ICD-10-CM | POA: Diagnosis not present

## 2017-12-23 DIAGNOSIS — R2681 Unsteadiness on feet: Secondary | ICD-10-CM | POA: Diagnosis not present

## 2017-12-23 DIAGNOSIS — I482 Chronic atrial fibrillation: Secondary | ICD-10-CM | POA: Diagnosis not present

## 2017-12-23 DIAGNOSIS — R278 Other lack of coordination: Secondary | ICD-10-CM | POA: Diagnosis not present

## 2017-12-23 DIAGNOSIS — M6281 Muscle weakness (generalized): Secondary | ICD-10-CM | POA: Diagnosis not present

## 2017-12-23 DIAGNOSIS — I10 Essential (primary) hypertension: Secondary | ICD-10-CM | POA: Diagnosis not present

## 2017-12-23 DIAGNOSIS — S32502D Unspecified fracture of left pubis, subsequent encounter for fracture with routine healing: Secondary | ICD-10-CM | POA: Diagnosis not present

## 2017-12-23 DIAGNOSIS — E870 Hyperosmolality and hypernatremia: Secondary | ICD-10-CM | POA: Diagnosis not present

## 2017-12-23 DIAGNOSIS — Z9181 History of falling: Secondary | ICD-10-CM | POA: Diagnosis not present

## 2017-12-23 LAB — CBC AND DIFFERENTIAL
HEMATOCRIT: 29 — AB (ref 36–46)
Hemoglobin: 9.9 — AB (ref 12.0–16.0)
PLATELETS: 294 (ref 150–399)
WBC: 7.7

## 2017-12-23 LAB — HEPATIC FUNCTION PANEL
ALK PHOS: 95 (ref 25–125)
ALT: 26 (ref 7–35)
AST: 26 (ref 13–35)
BILIRUBIN, TOTAL: 0.5

## 2017-12-23 LAB — VITAMIN D 25 HYDROXY (VIT D DEFICIENCY, FRACTURES): Vit D, 25-Hydroxy: 40

## 2017-12-23 LAB — BASIC METABOLIC PANEL
BUN: 45 — AB (ref 4–21)
Creatinine: 1 (ref 0.5–1.1)
Glucose: 99
Potassium: 4.6 (ref 3.4–5.3)
Sodium: 136 — AB (ref 137–147)

## 2017-12-23 LAB — LIPID PANEL
CHOLESTEROL: 135 (ref 0–200)
HDL: 46 (ref 35–70)
LDL CALC: 74
TRIGLYCERIDES: 71 (ref 40–160)

## 2017-12-23 LAB — TSH: TSH: 5.11 (ref 0.41–5.90)

## 2017-12-24 LAB — CMP 10231
Albumin: 3.8
CHLORIDE: 105
CO2: 25
Calcium: 9.2
EGFR (Non-African Amer.): 49
Globulin: 2.1
Total Protein: 5.9

## 2017-12-27 DIAGNOSIS — R41841 Cognitive communication deficit: Secondary | ICD-10-CM | POA: Diagnosis not present

## 2017-12-27 DIAGNOSIS — M6281 Muscle weakness (generalized): Secondary | ICD-10-CM | POA: Diagnosis not present

## 2017-12-27 DIAGNOSIS — M542 Cervicalgia: Secondary | ICD-10-CM | POA: Diagnosis not present

## 2017-12-27 DIAGNOSIS — S32502D Unspecified fracture of left pubis, subsequent encounter for fracture with routine healing: Secondary | ICD-10-CM | POA: Diagnosis not present

## 2017-12-27 DIAGNOSIS — S2242XD Multiple fractures of ribs, left side, subsequent encounter for fracture with routine healing: Secondary | ICD-10-CM | POA: Diagnosis not present

## 2017-12-27 DIAGNOSIS — I10 Essential (primary) hypertension: Secondary | ICD-10-CM | POA: Diagnosis not present

## 2017-12-27 DIAGNOSIS — M25552 Pain in left hip: Secondary | ICD-10-CM | POA: Diagnosis not present

## 2017-12-27 DIAGNOSIS — R2681 Unsteadiness on feet: Secondary | ICD-10-CM | POA: Diagnosis not present

## 2017-12-27 DIAGNOSIS — N390 Urinary tract infection, site not specified: Secondary | ICD-10-CM | POA: Diagnosis not present

## 2017-12-27 DIAGNOSIS — R29898 Other symptoms and signs involving the musculoskeletal system: Secondary | ICD-10-CM | POA: Diagnosis not present

## 2017-12-27 DIAGNOSIS — R278 Other lack of coordination: Secondary | ICD-10-CM | POA: Diagnosis not present

## 2017-12-27 DIAGNOSIS — Z7901 Long term (current) use of anticoagulants: Secondary | ICD-10-CM | POA: Diagnosis not present

## 2017-12-27 DIAGNOSIS — Z7389 Other problems related to life management difficulty: Secondary | ICD-10-CM | POA: Diagnosis not present

## 2017-12-27 DIAGNOSIS — R2689 Other abnormalities of gait and mobility: Secondary | ICD-10-CM | POA: Diagnosis not present

## 2017-12-27 DIAGNOSIS — K5793 Diverticulitis of intestine, part unspecified, without perforation or abscess with bleeding: Secondary | ICD-10-CM | POA: Diagnosis not present

## 2017-12-27 DIAGNOSIS — H8109 Meniere's disease, unspecified ear: Secondary | ICD-10-CM | POA: Diagnosis not present

## 2017-12-27 DIAGNOSIS — E039 Hypothyroidism, unspecified: Secondary | ICD-10-CM | POA: Diagnosis not present

## 2017-12-27 DIAGNOSIS — Z9181 History of falling: Secondary | ICD-10-CM | POA: Diagnosis not present

## 2017-12-27 DIAGNOSIS — K219 Gastro-esophageal reflux disease without esophagitis: Secondary | ICD-10-CM | POA: Diagnosis not present

## 2017-12-27 DIAGNOSIS — S2242XS Multiple fractures of ribs, left side, sequela: Secondary | ICD-10-CM | POA: Diagnosis not present

## 2017-12-29 DIAGNOSIS — M25552 Pain in left hip: Secondary | ICD-10-CM | POA: Diagnosis not present

## 2017-12-29 DIAGNOSIS — Z9181 History of falling: Secondary | ICD-10-CM | POA: Diagnosis not present

## 2017-12-29 DIAGNOSIS — R278 Other lack of coordination: Secondary | ICD-10-CM | POA: Diagnosis not present

## 2017-12-29 DIAGNOSIS — S32502D Unspecified fracture of left pubis, subsequent encounter for fracture with routine healing: Secondary | ICD-10-CM | POA: Diagnosis not present

## 2017-12-29 DIAGNOSIS — R2681 Unsteadiness on feet: Secondary | ICD-10-CM | POA: Diagnosis not present

## 2017-12-29 DIAGNOSIS — M6281 Muscle weakness (generalized): Secondary | ICD-10-CM | POA: Diagnosis not present

## 2017-12-29 IMAGING — CT CT ABD-PELV W/ CM
2 of 5 series · 14 of 36 positions shown, 17 images · IV contrast (iopamidol)
Comparison: Chest radiograph 06/01/2016.

CLINICAL DATA: Patient status post fall hitting left ribs on [place].

EXAM:
CT CHEST, ABDOMEN, AND PELVIS WITH CONTRAST
TECHNIQUE: Multidetector CT imaging of the chest, abdomen and pelvis was
performed following the standard protocol during bolus
administration of intravenous contrast.
CONTRAST:  100mL [id number]-DCC IOPAMIDOL (VZ6XIL-DCC) INJECTION 61%

[Series 201: cap with, idose (2) · axial · 0.79mm/px · z∈[+196,+711]mm · 10 of 127 slices shown, 13 images]
[im 12/127  mediastinal]
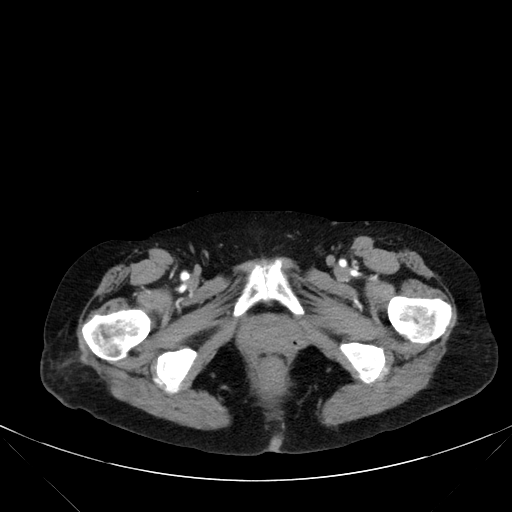
[im 12/127  lung]
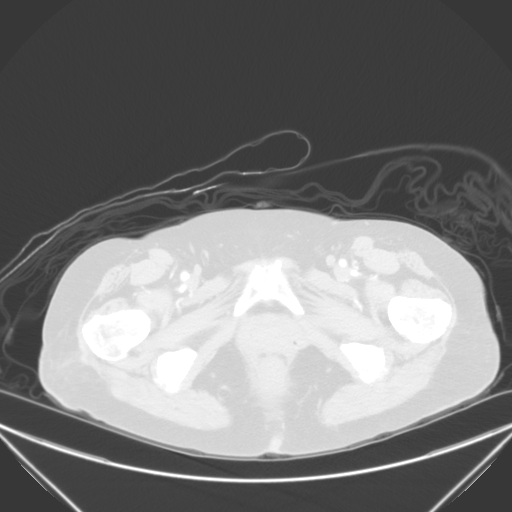
[im 23/127  lung]
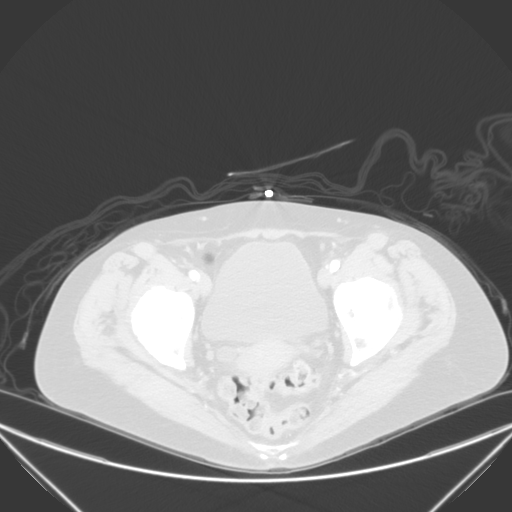
[im 35/127  lung]
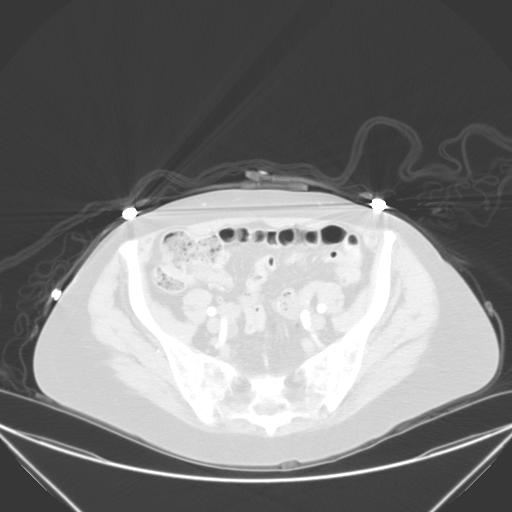
[im 46/127  lung]
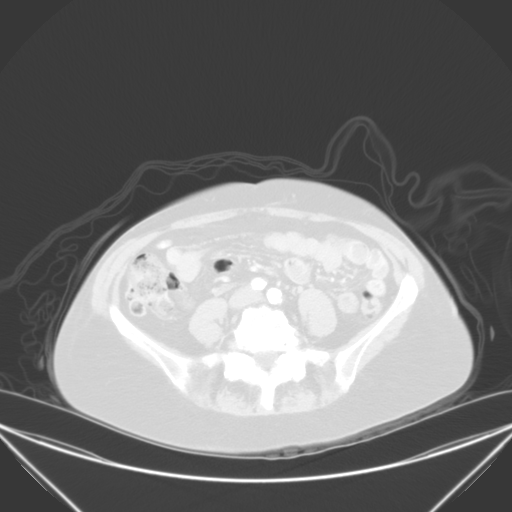
[im 58/127  mediastinal]
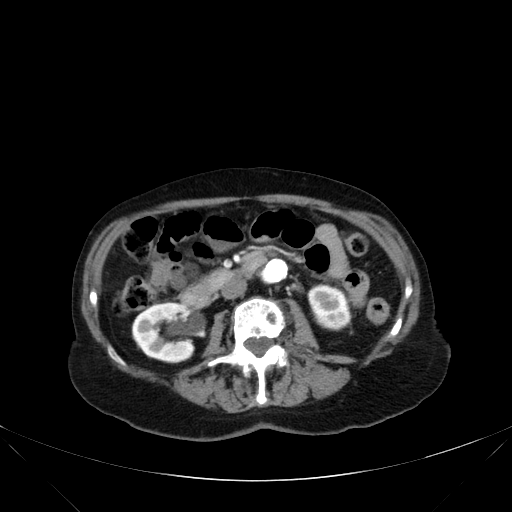
[im 58/127  lung]
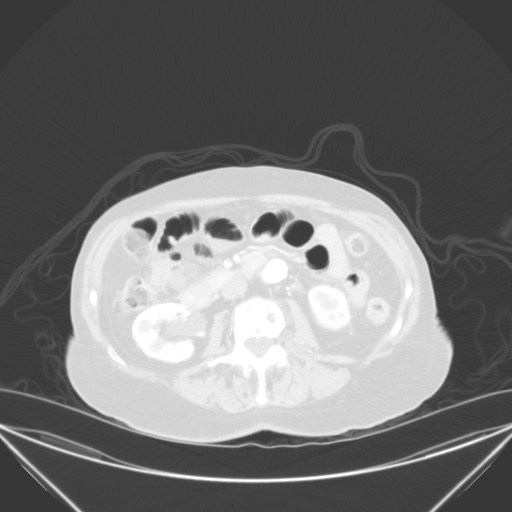
[im 69/127  lung]
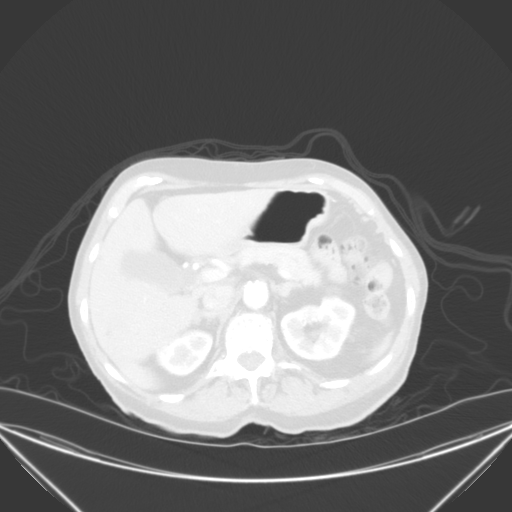
[im 81/127  lung]
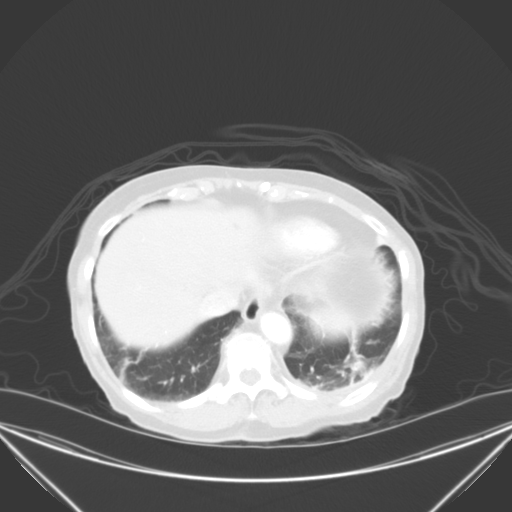
[im 92/127  lung]
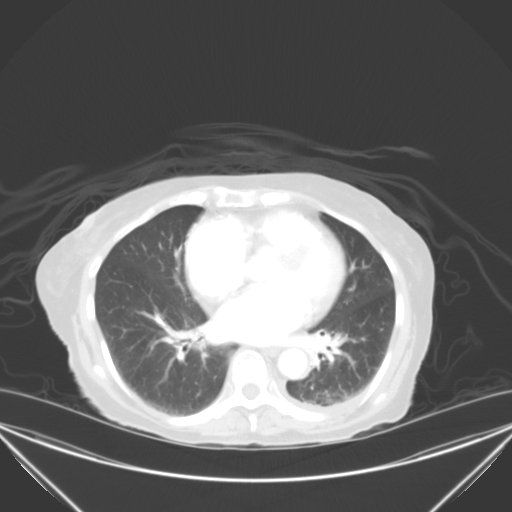
[im 104/127  mediastinal]
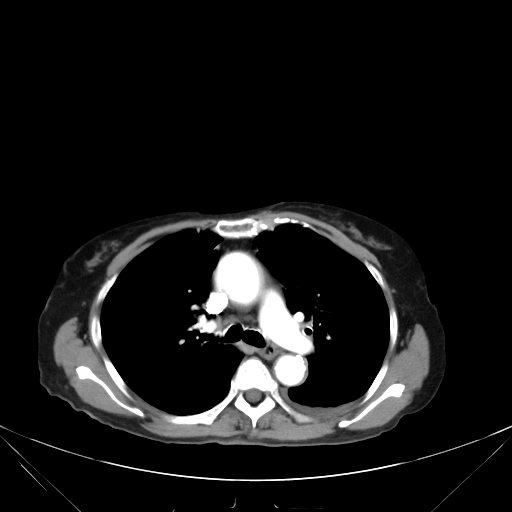
[im 104/127  lung]
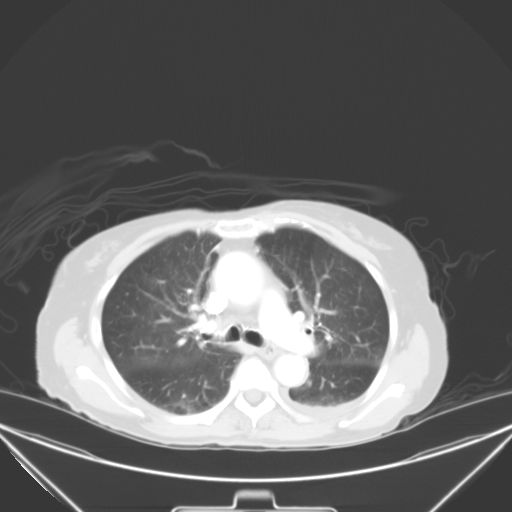
[im 115/127  lung]
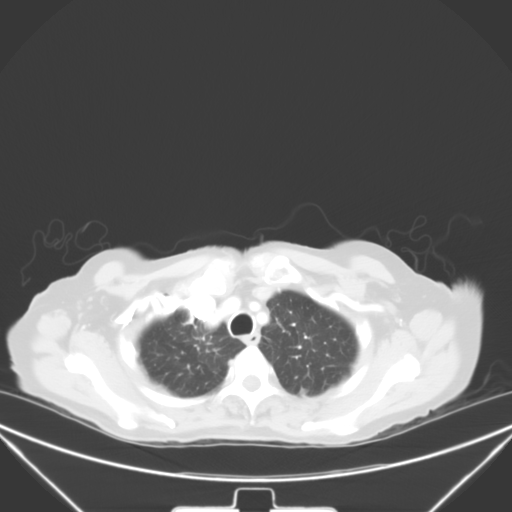

[Series 205: cap with lungs, idose (2) · axial · 0.79mm/px · z∈[+504,+594]mm · 4 of 122 slices shown]
[im 13/122  lung]
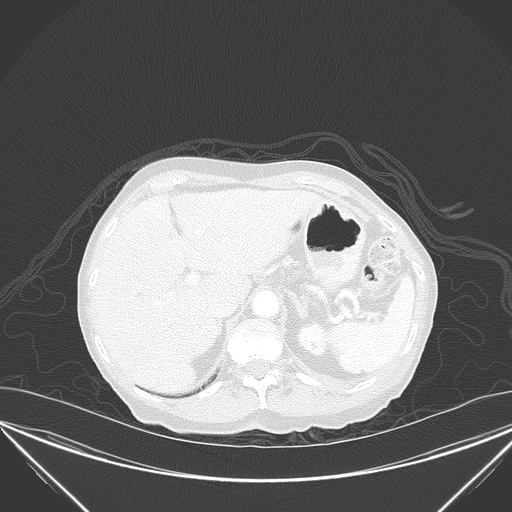
[im 25/122  lung]
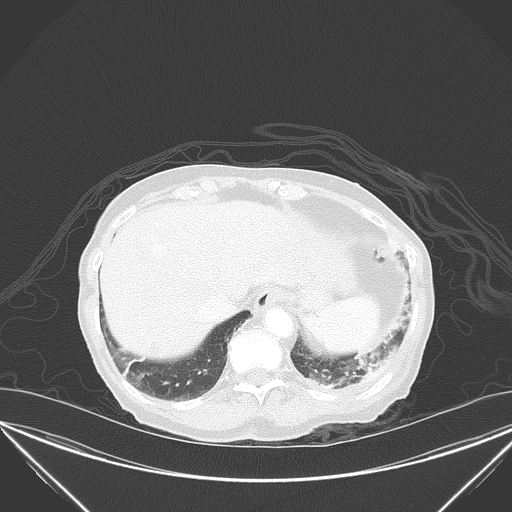
[im 37/122  lung]
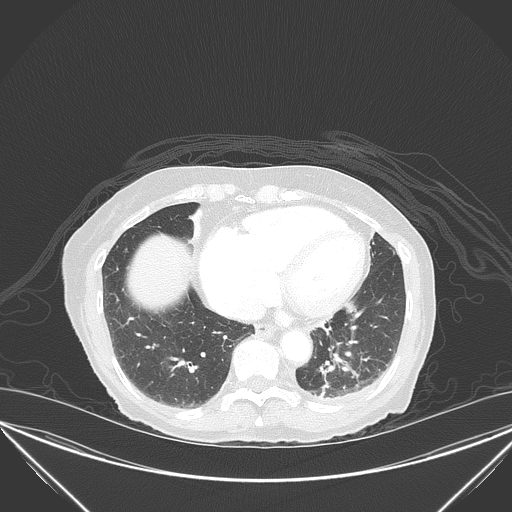
[im 49/122  lung]
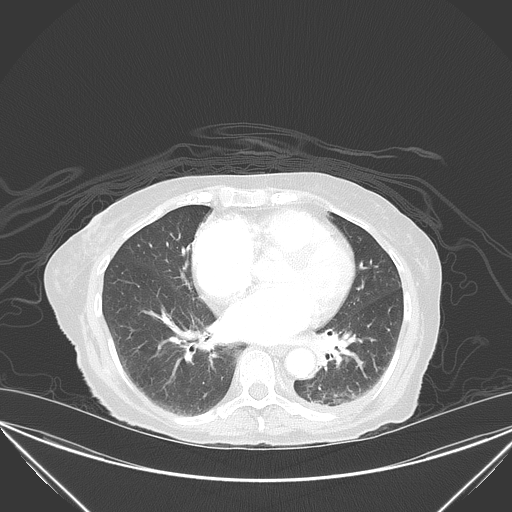

[14 of 36 positions shown; findings below may reference images not displayed]

FINDINGS: CT CHEST FINDINGS

Cardiovascular: Heart is enlarged. No pericardial effusion.
Ascending thoracic aorta measures 3.7 cm. Aortic vascular
calcifications.

Mediastinum/Nodes: No enlarged axillary, mediastinal or hilar
lymphadenopathy. Small hiatal hernia.

Lungs/Pleura: Central airways are patent. Dependent bandlike
atelectasis within the left greater than right lower lobes. Small
left pleural effusion. No pneumothorax.

Musculoskeletal: Mildly displaced comminuted fracture of the
posterior left ninth rib (image 89; series 205). Nondisplaced
fracture through the posterior tenth rib. Nondisplaced fractures
through the lateral sixth, seventh and eighth left ribs. Thoracic
spine degenerative changes.

CT ABDOMEN PELVIS FINDINGS

Hepatobiliary: There is a 2.0 cm cyst within the right hepatic lobe
(image 60; series 201). Additional too small to characterize
subcentimeter low-attenuation lesions within the left and right
hepatic lobes. Within the hepatic dome there is a 1.6 cm flash
filling lesion (image 49; series 201). Additionally, adjacent to the
falciform ligament is a 3.7 cm area of increased
attenuation/differential profusion (image 52; series 201).

Pancreas: Unremarkable

Spleen: Unremarkable

Adrenals/Urinary Tract: The adrenal glands are normal. Kidneys
enhance symmetrically with contrast. Bilateral renal cysts are
demonstrated. Additionally there are too small to characterize
low-attenuation lesions within the right kidney. There is
mild-to-moderate dilatation of the right ureter to the level of the
urinary bladder. No definitive obstructing lesion is identified.
Urinary bladder is unremarkable. 2 mm nonobstructing stone superior
pole right kidney (image 67; series 203).

Stomach/Bowel: Sigmoid colonic diverticulosis. No CT evidence for
acute diverticulitis. No abnormal bowel wall thickening or evidence
for bowel obstruction. No free fluid or free intraperitoneal air.
Normal morphology of the stomach.

Vascular/Lymphatic: Infrarenal abdominal aortic ectasia measuring
1.7 cm. No retroperitoneal lymphadenopathy.

Reproductive: Uterus and adnexal structures unremarkable.

Other: None.

Musculoskeletal: Lumbar spine degenerative changes. No aggressive or
acute appearing osseous lesions.
IMPRESSION: Multiple left-sided rib fractures as above. Small left pleural
effusion. No definite left-sided pneumothorax.

Otherwise no traumatic visceral injury within the chest, abdomen or
pelvis.

There is mild-to-moderate dilatation of the right ureter to the
level of the urinary bladder without definitive obstructing lesion
identified. Consider urologic consultation.

Heterogeneous opacities left lung base favored to represent
atelectasis.

Hyperenhancing lesion within the hepatic dome with associated
perfusional anomaly within the liver adjacent to the falciform
ligament. In the absence of known malignancy, these are likely
benign in etiology, potentially representing hemangiomas. Consider
follow-up pre and post contrast-enhanced MRI in 6 months.

Aortic atherosclerosis.

Ascending thoracic aorta measures 3.7 cm. Recommend annual imaging
followup by CTA or MRA. This recommendation follows 2030
ACCF/AHA/AATS/ACR/ASA/SCA/MDRASEL/KLASH/SUBRAMANIAN/MONJE Guidelines for the
Diagnosis and Management of Patients with Thoracic Aortic Disease.
Circulation.2030; 121: [id number]

## 2017-12-31 DIAGNOSIS — R2681 Unsteadiness on feet: Secondary | ICD-10-CM | POA: Diagnosis not present

## 2017-12-31 DIAGNOSIS — M25552 Pain in left hip: Secondary | ICD-10-CM | POA: Diagnosis not present

## 2017-12-31 DIAGNOSIS — Z9181 History of falling: Secondary | ICD-10-CM | POA: Diagnosis not present

## 2017-12-31 DIAGNOSIS — M6281 Muscle weakness (generalized): Secondary | ICD-10-CM | POA: Diagnosis not present

## 2017-12-31 DIAGNOSIS — R278 Other lack of coordination: Secondary | ICD-10-CM | POA: Diagnosis not present

## 2017-12-31 DIAGNOSIS — S32502D Unspecified fracture of left pubis, subsequent encounter for fracture with routine healing: Secondary | ICD-10-CM | POA: Diagnosis not present

## 2018-01-03 DIAGNOSIS — S32599A Other specified fracture of unspecified pubis, initial encounter for closed fracture: Secondary | ICD-10-CM | POA: Diagnosis not present

## 2018-01-04 DIAGNOSIS — M6281 Muscle weakness (generalized): Secondary | ICD-10-CM | POA: Diagnosis not present

## 2018-01-04 DIAGNOSIS — R278 Other lack of coordination: Secondary | ICD-10-CM | POA: Diagnosis not present

## 2018-01-04 DIAGNOSIS — M25552 Pain in left hip: Secondary | ICD-10-CM | POA: Diagnosis not present

## 2018-01-04 DIAGNOSIS — S32502D Unspecified fracture of left pubis, subsequent encounter for fracture with routine healing: Secondary | ICD-10-CM | POA: Diagnosis not present

## 2018-01-04 DIAGNOSIS — Z9181 History of falling: Secondary | ICD-10-CM | POA: Diagnosis not present

## 2018-01-04 DIAGNOSIS — R2681 Unsteadiness on feet: Secondary | ICD-10-CM | POA: Diagnosis not present

## 2018-01-05 DIAGNOSIS — S32502D Unspecified fracture of left pubis, subsequent encounter for fracture with routine healing: Secondary | ICD-10-CM | POA: Diagnosis not present

## 2018-01-05 DIAGNOSIS — R2681 Unsteadiness on feet: Secondary | ICD-10-CM | POA: Diagnosis not present

## 2018-01-05 DIAGNOSIS — M6281 Muscle weakness (generalized): Secondary | ICD-10-CM | POA: Diagnosis not present

## 2018-01-05 DIAGNOSIS — R278 Other lack of coordination: Secondary | ICD-10-CM | POA: Diagnosis not present

## 2018-01-05 DIAGNOSIS — M25552 Pain in left hip: Secondary | ICD-10-CM | POA: Diagnosis not present

## 2018-01-05 DIAGNOSIS — Z9181 History of falling: Secondary | ICD-10-CM | POA: Diagnosis not present

## 2018-01-06 DIAGNOSIS — Z9181 History of falling: Secondary | ICD-10-CM | POA: Diagnosis not present

## 2018-01-06 DIAGNOSIS — M6281 Muscle weakness (generalized): Secondary | ICD-10-CM | POA: Diagnosis not present

## 2018-01-06 DIAGNOSIS — R278 Other lack of coordination: Secondary | ICD-10-CM | POA: Diagnosis not present

## 2018-01-06 DIAGNOSIS — M25552 Pain in left hip: Secondary | ICD-10-CM | POA: Diagnosis not present

## 2018-01-06 DIAGNOSIS — S32502D Unspecified fracture of left pubis, subsequent encounter for fracture with routine healing: Secondary | ICD-10-CM | POA: Diagnosis not present

## 2018-01-06 DIAGNOSIS — R2681 Unsteadiness on feet: Secondary | ICD-10-CM | POA: Diagnosis not present

## 2018-01-07 DIAGNOSIS — Z9181 History of falling: Secondary | ICD-10-CM | POA: Diagnosis not present

## 2018-01-07 DIAGNOSIS — M6281 Muscle weakness (generalized): Secondary | ICD-10-CM | POA: Diagnosis not present

## 2018-01-07 DIAGNOSIS — R278 Other lack of coordination: Secondary | ICD-10-CM | POA: Diagnosis not present

## 2018-01-07 DIAGNOSIS — S32502D Unspecified fracture of left pubis, subsequent encounter for fracture with routine healing: Secondary | ICD-10-CM | POA: Diagnosis not present

## 2018-01-07 DIAGNOSIS — M25552 Pain in left hip: Secondary | ICD-10-CM | POA: Diagnosis not present

## 2018-01-07 DIAGNOSIS — R2681 Unsteadiness on feet: Secondary | ICD-10-CM | POA: Diagnosis not present

## 2018-01-09 DIAGNOSIS — S32502D Unspecified fracture of left pubis, subsequent encounter for fracture with routine healing: Secondary | ICD-10-CM | POA: Diagnosis not present

## 2018-01-09 DIAGNOSIS — M6281 Muscle weakness (generalized): Secondary | ICD-10-CM | POA: Diagnosis not present

## 2018-01-09 DIAGNOSIS — R278 Other lack of coordination: Secondary | ICD-10-CM | POA: Diagnosis not present

## 2018-01-09 DIAGNOSIS — R2681 Unsteadiness on feet: Secondary | ICD-10-CM | POA: Diagnosis not present

## 2018-01-09 DIAGNOSIS — M25552 Pain in left hip: Secondary | ICD-10-CM | POA: Diagnosis not present

## 2018-01-09 DIAGNOSIS — Z9181 History of falling: Secondary | ICD-10-CM | POA: Diagnosis not present

## 2018-01-10 DIAGNOSIS — R278 Other lack of coordination: Secondary | ICD-10-CM | POA: Diagnosis not present

## 2018-01-10 DIAGNOSIS — M25552 Pain in left hip: Secondary | ICD-10-CM | POA: Diagnosis not present

## 2018-01-10 DIAGNOSIS — S32502D Unspecified fracture of left pubis, subsequent encounter for fracture with routine healing: Secondary | ICD-10-CM | POA: Diagnosis not present

## 2018-01-10 DIAGNOSIS — M6281 Muscle weakness (generalized): Secondary | ICD-10-CM | POA: Diagnosis not present

## 2018-01-10 DIAGNOSIS — R2681 Unsteadiness on feet: Secondary | ICD-10-CM | POA: Diagnosis not present

## 2018-01-10 DIAGNOSIS — Z9181 History of falling: Secondary | ICD-10-CM | POA: Diagnosis not present

## 2018-01-11 ENCOUNTER — Non-Acute Institutional Stay (SKILLED_NURSING_FACILITY): Payer: Medicare Other | Admitting: Nurse Practitioner

## 2018-01-11 DIAGNOSIS — G5701 Lesion of sciatic nerve, right lower limb: Secondary | ICD-10-CM | POA: Diagnosis not present

## 2018-01-11 DIAGNOSIS — R278 Other lack of coordination: Secondary | ICD-10-CM | POA: Diagnosis not present

## 2018-01-11 DIAGNOSIS — R2681 Unsteadiness on feet: Secondary | ICD-10-CM | POA: Diagnosis not present

## 2018-01-11 DIAGNOSIS — G309 Alzheimer's disease, unspecified: Secondary | ICD-10-CM | POA: Diagnosis not present

## 2018-01-11 DIAGNOSIS — F329 Major depressive disorder, single episode, unspecified: Secondary | ICD-10-CM | POA: Diagnosis not present

## 2018-01-11 DIAGNOSIS — F0280 Dementia in other diseases classified elsewhere without behavioral disturbance: Secondary | ICD-10-CM | POA: Diagnosis not present

## 2018-01-11 DIAGNOSIS — S32502D Unspecified fracture of left pubis, subsequent encounter for fracture with routine healing: Secondary | ICD-10-CM | POA: Diagnosis not present

## 2018-01-11 DIAGNOSIS — Z9181 History of falling: Secondary | ICD-10-CM | POA: Diagnosis not present

## 2018-01-11 DIAGNOSIS — M25552 Pain in left hip: Secondary | ICD-10-CM | POA: Diagnosis not present

## 2018-01-11 DIAGNOSIS — M6281 Muscle weakness (generalized): Secondary | ICD-10-CM | POA: Diagnosis not present

## 2018-01-11 NOTE — Progress Notes (Signed)
Location:  [place] Room Number: 105 Place of Service:  SNF (31) Provider:  Lennie Odor Tsuyako Jolley NP  Blanchie Serve, MD  Patient Care Team: Blanchie Serve, MD as PCP - General (Internal Medicine) Akin Yi X, NP as Nurse Practitioner (Internal Medicine) [name], MD as Attending Physician (Neurology)  Extended Emergency Contact Information Primary Emergency Contact: Key,Caroline Address: [place] of [place] Phone: [phone] Mobile Phone: [phone] Relation: Daughter Secondary Emergency Contact: Key,Tim  [place] Mobile Phone: [phone] Relation: Son  Code Status:  DNR Goals of care: Advanced Directive information Advanced Directives 01/12/2018  Does Patient Have a Medical Advance Directive? Yes  Type of Paramedic of [place];Living will;Out of facility DNR (pink MOST or yellow form)  Does patient want to make changes to medical advance directive? No - Patient declined  Copy of [place] in Chart? Yes  Would patient like information on creating a medical advance directive? -  Pre-existing out of facility DNR order (yellow form or pink MOST form) Yellow form placed in chart (order not valid for inpatient use)     Chief Complaint  Patient presents with  . Acute Visit    (Rt) hip pain    HPI:  Pt is a 84 y.o. female seen today for an acute visit for right buttock pain when picking up right leg, negative straight leg raise or the right hip external/interal rotation or flexion/extention motion of the right leg. Duration is uncertain. She is able to ambulates with walker. HPI was provided with assistance of staff due to her advanced dementia, she resides in memory care unit [facility] since her ED eval for fall 12/16/17 and left displaced fracture of superior and inferior pubic rami.    Past Medical History:  Diagnosis Date  . Adenomatous colon  polyp 07/17/97  . Allergic rhinitis   . Allergy   . Anxiety disorder   . Atrial fibrillation ([place])   . Chronic anticoagulation   . Diverticulosis   . History of shingles   . Hypercholesteremia   . Hypertension   . Hypothyroidism   . Internal hemorrhoids   . Melanoma ([place])   . Memory loss   . Meniere disease   . Tension headache    Past Surgical History:  Procedure Laterality Date  . CATARACT EXTRACTION Right 2012    Allergies  Allergen Reactions  . Amitriptyline     Urinary Retention  . Amlodipine Swelling  . Erythromycin Hives  . Nitrofurantoin Nausea Only  . Sudafed [Pseudoephedrine Hcl]     Unknown reaction per MAR   . Sulfa Antibiotics     "really ill"  . Tavist [Clemastine]     Urinary hesitancy  . Trimethoprim     Cramps  . Verapamil     Constipation    Outpatient Encounter Medications as of 01/11/2018  Medication Sig  . acetaminophen (TYLENOL) 325 MG tablet Take 650 mg by mouth every 6 (six) hours as needed for headache.  Marland Kitchen acetaminophen (TYLENOL) 325 MG tablet Take 650 mg by mouth every 8 (eight) hours. Stop date 01/02/18   . atorvastatin (LIPITOR) 10 MG tablet Take 1 tablet (10 mg total) by mouth daily.  . Calcium-Phosphorus-Vitamin D (CITRACAL CALCIUM GUMMIES PO) Take 1 each by mouth daily.   . carvedilol (COREG) 6.25 MG tablet Take 1 tablet (6.25 mg total) by mouth 2 (two) times daily with a  meal.  . donepezil (ARICEPT) 10 MG tablet Take 10 mg by mouth at bedtime.   . feeding supplement (BOOST / RESOURCE BREEZE) LIQD Take 1 Container by mouth 2 (two) times daily between meals. Prefers Vanilla flavored  . hydrocortisone (ANUSOL-HC) 2.5 % rectal cream Place 1 application rectally 2 (two) times daily as needed for hemorrhoids.  Marland Kitchen levothyroxine (SYNTHROID, LEVOTHROID) 50 MCG tablet Take 50 mcg by mouth See admin instructions. Only on Tuesday, Thursday, Saturday, Sunday.  . levothyroxine (SYNTHROID, LEVOTHROID) 75 MCG tablet Take 75 mcg by mouth 2 (two) times a  week. Take one tablet Monday and Friday   . lisinopril (PRINIVIL,ZESTRIL) 10 MG tablet Take 10 mg by mouth daily.  . meloxicam (MOBIC) 7.5 MG tablet Take 7.5 mg by mouth daily with breakfast.  . memantine (NAMENDA) 5 MG tablet Take 1 tablet (5 mg total) by mouth daily.  . QUEtiapine (SEROQUEL) 25 MG tablet Take 12.5 mg by mouth 2 (two) times daily.   . [DISCONTINUED] triamterene-hydrochlorothiazide (MAXZIDE-25) 37.5-25 MG tablet Take 0.5 tablets by mouth daily.    No facility-administered encounter medications on file as of 01/11/2018.    ROS was provided with assistance of staff Review of Systems  Constitutional: Negative for activity change, appetite change, chills, diaphoresis, fatigue and fever.  HENT: Positive for hearing loss. Negative for congestion.   Respiratory: Negative for cough, shortness of breath and wheezing.   Cardiovascular: Positive for leg swelling. Negative for chest pain and palpitations.  Genitourinary: Negative for difficulty urinating, dysuria and urgency.  Musculoskeletal: Positive for arthralgias and gait problem.  Skin: Negative for color change and pallor.  Neurological: Negative for dizziness, speech difficulty, weakness and headaches.  Psychiatric/Behavioral: Positive for confusion. Negative for agitation, behavioral problems, hallucinations and sleep disturbance. The patient is not nervous/anxious.     Immunization History  Administered Date(s) Administered  . Influenza-Unspecified 07/19/2017  . PPD Test 09/05/2016   Pertinent  Health Maintenance Due  Topic Date Due  . DEXA SCAN  04/28/1992  . PNA vac Low Risk Adult (1 of 2 - PCV13) 04/28/1992  . INFLUENZA VACCINE  04/28/2018   Fall Risk  10/14/2017 01/28/2016  Falls in the past year? Yes No  Number falls in past yr: 1 -  Injury with Fall? Yes -   Functional Status Survey:    Vitals:   01/12/18 1028  BP: 130/70  Pulse: 68  Resp: 16  Temp: 98.2 F (36.8 C)  SpO2: 95%  Weight: 132 lb 3.2 oz  (60 kg)  Height: 5\' 6"  (1.676 m)   Body mass index is 21.34 kg/m. Physical Exam  Constitutional: She appears well-developed and well-nourished.  HENT:  Head: Normocephalic and atraumatic.  Eyes: Pupils are equal, round, and reactive to light. EOM are normal.  Neck: Normal range of motion. Neck supple. No JVD present.  Cardiovascular: Normal rate and regular rhythm.  No murmur heard. Pulmonary/Chest: Effort normal and breath sounds normal.  Abdominal: Soft. Bowel sounds are normal.  Musculoskeletal: Normal range of motion. She exhibits edema and tenderness.  1+ edema BLE. Pain in the right buttock when picking her right leg up, travels down to the back of right thigh. Ambulates with walker.   Neurological: She is alert. No cranial nerve deficit. Coordination normal.  Oriented to self and her room on unit.   Skin: Skin is warm and dry.  Psychiatric: She has a normal mood and affect. Her behavior is normal.    Labs reviewed: Recent Labs    05/27/17  06/29/17 12/23/17  NA 137 137 136*  K 4.2 4.7 4.6  CL  --   --  105  CO2  --   --  25  BUN 29* 33* 45*  CREATININE 1.0 1.1 1.0  CALCIUM  --   --  9.2   Recent Labs    05/27/17 06/29/17 12/23/17  AST 17 24 26   ALT 12 29 26   ALKPHOS 59 82 95  PROT  --   --  5.9  ALBUMIN  --   --  3.8   Recent Labs    06/29/17 11/25/17 12/23/17  WBC 7.6 6.1 7.7  HGB 12.7 11.2* 9.9*  HCT 38 33* 29*  PLT 265 234 294   Lab Results  Component Value Date   TSH 5.11 12/23/2017   Lab Results  Component Value Date   HGBA1C 5.7 09/30/2017   Lab Results  Component Value Date   CHOL 135 12/23/2017   HDL 46 12/23/2017   LDLCALC 74 12/23/2017   TRIG 71 12/23/2017    Significant Diagnostic Results in last 30 days:  No results found.  Assessment/Plan: Piriformis syndrome of right side right buttock pain when picking up right leg, negative straight leg raise or the right hip external/interal rotation or flexion/extention motion of the right  leg. Duration is uncertain. She is able to ambulates with walker. Dull pain, radiated to posterior of the right thigh. Will continue PT, apply BioFreeze qid to the right buttock, continue Tylenol tid.  observe.   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 since her ED eval for fall 12/16/17 and left displaced fracture of superior and inferior pubic rami. Continue Seroquel 12.5mg  bid, Namenda 5mg  qd, Donepezil 10mg  daily for memory and mood.       Family/ staff Communication: plan of care reviewed with the patient, PT, and charge nurse.   Labs/tests ordered:  none  Time spend 25 minutes.

## 2018-01-11 NOTE — Assessment & Plan Note (Addendum)
right buttock pain when picking up right leg, negative straight leg raise or the right hip external/interal rotation or flexion/extention motion of the right leg. Duration is uncertain. She is able to ambulates with walker. Dull pain, radiated to posterior of the right thigh. Will continue PT, apply BioFreeze qid to the right buttock, continue Tylenol tid.  observe.

## 2018-01-11 NOTE — Assessment & Plan Note (Signed)
HPI was provided with assistance of staff due to her advanced dementia, she resides in memory care unit [facility] since her ED eval for fall 12/16/17 and left displaced fracture of superior and inferior pubic rami. Continue Seroquel 12.5mg  bid, Namenda 5mg  qd, Donepezil 10mg  daily for memory and mood.

## 2018-01-12 ENCOUNTER — Encounter: Payer: Self-pay | Admitting: Nurse Practitioner

## 2018-01-12 DIAGNOSIS — M6281 Muscle weakness (generalized): Secondary | ICD-10-CM | POA: Diagnosis not present

## 2018-01-12 DIAGNOSIS — R2681 Unsteadiness on feet: Secondary | ICD-10-CM | POA: Diagnosis not present

## 2018-01-12 DIAGNOSIS — S32502D Unspecified fracture of left pubis, subsequent encounter for fracture with routine healing: Secondary | ICD-10-CM | POA: Diagnosis not present

## 2018-01-12 DIAGNOSIS — M25552 Pain in left hip: Secondary | ICD-10-CM | POA: Diagnosis not present

## 2018-01-12 DIAGNOSIS — Z9181 History of falling: Secondary | ICD-10-CM | POA: Diagnosis not present

## 2018-01-12 DIAGNOSIS — R278 Other lack of coordination: Secondary | ICD-10-CM | POA: Diagnosis not present

## 2018-01-13 DIAGNOSIS — M25552 Pain in left hip: Secondary | ICD-10-CM | POA: Diagnosis not present

## 2018-01-13 DIAGNOSIS — R278 Other lack of coordination: Secondary | ICD-10-CM | POA: Diagnosis not present

## 2018-01-13 DIAGNOSIS — M6281 Muscle weakness (generalized): Secondary | ICD-10-CM | POA: Diagnosis not present

## 2018-01-13 DIAGNOSIS — S32502D Unspecified fracture of left pubis, subsequent encounter for fracture with routine healing: Secondary | ICD-10-CM | POA: Diagnosis not present

## 2018-01-13 DIAGNOSIS — Z9181 History of falling: Secondary | ICD-10-CM | POA: Diagnosis not present

## 2018-01-13 DIAGNOSIS — R2681 Unsteadiness on feet: Secondary | ICD-10-CM | POA: Diagnosis not present

## 2018-01-17 ENCOUNTER — Non-Acute Institutional Stay (SKILLED_NURSING_FACILITY): Payer: Medicare Other | Admitting: Nurse Practitioner

## 2018-01-17 ENCOUNTER — Encounter: Payer: Self-pay | Admitting: Nurse Practitioner

## 2018-01-17 DIAGNOSIS — G309 Alzheimer's disease, unspecified: Secondary | ICD-10-CM

## 2018-01-17 DIAGNOSIS — G5701 Lesion of sciatic nerve, right lower limb: Secondary | ICD-10-CM

## 2018-01-17 DIAGNOSIS — R609 Edema, unspecified: Secondary | ICD-10-CM | POA: Diagnosis not present

## 2018-01-17 DIAGNOSIS — F329 Major depressive disorder, single episode, unspecified: Secondary | ICD-10-CM | POA: Diagnosis not present

## 2018-01-17 DIAGNOSIS — F0280 Dementia in other diseases classified elsewhere without behavioral disturbance: Secondary | ICD-10-CM

## 2018-01-17 DIAGNOSIS — F0283 Dementia in other diseases classified elsewhere, unspecified severity, with mood disturbance: Secondary | ICD-10-CM

## 2018-01-17 DIAGNOSIS — E039 Hypothyroidism, unspecified: Secondary | ICD-10-CM | POA: Diagnosis not present

## 2018-01-17 DIAGNOSIS — G301 Alzheimer's disease with late onset: Secondary | ICD-10-CM

## 2018-01-17 DIAGNOSIS — I10 Essential (primary) hypertension: Secondary | ICD-10-CM

## 2018-01-17 DIAGNOSIS — F0281 Dementia in other diseases classified elsewhere with behavioral disturbance: Secondary | ICD-10-CM | POA: Diagnosis not present

## 2018-01-17 DIAGNOSIS — F02818 Dementia in other diseases classified elsewhere, unspecified severity, with other behavioral disturbance: Secondary | ICD-10-CM

## 2018-01-17 DIAGNOSIS — Z9181 History of falling: Secondary | ICD-10-CM | POA: Diagnosis not present

## 2018-01-17 DIAGNOSIS — R2681 Unsteadiness on feet: Secondary | ICD-10-CM | POA: Diagnosis not present

## 2018-01-17 DIAGNOSIS — R278 Other lack of coordination: Secondary | ICD-10-CM | POA: Diagnosis not present

## 2018-01-17 DIAGNOSIS — S32502D Unspecified fracture of left pubis, subsequent encounter for fracture with routine healing: Secondary | ICD-10-CM | POA: Diagnosis not present

## 2018-01-17 DIAGNOSIS — M6281 Muscle weakness (generalized): Secondary | ICD-10-CM | POA: Diagnosis not present

## 2018-01-17 DIAGNOSIS — M25552 Pain in left hip: Secondary | ICD-10-CM | POA: Diagnosis not present

## 2018-01-17 NOTE — Assessment & Plan Note (Addendum)
SBP is in 150-160, DSP in 70-80s, continue  Carvedilol 6.25mg  bid, Lisinopril 10mg  qd.

## 2018-01-17 NOTE — Assessment & Plan Note (Signed)
Right buttock pain, left pelvic fx, is stable, continue Meloxicam 7.5mg  qd, Tylenol 1000mg  q8h po, BioFreeze to R buttock for total 4 weeks.

## 2018-01-17 NOTE — Assessment & Plan Note (Addendum)
Compression hosiery knee high, med weight, on am, off pm, weigh the patient daily x 2 weeks. Off Maxzide 12/21/17

## 2018-01-17 NOTE — Assessment & Plan Note (Addendum)
TSH 5.11 12/23/17, continue Levothyroxine,  update TSH free T3, free T4 01/23/18.

## 2018-01-17 NOTE — Progress Notes (Signed)
Location:  [place] Room Number: 105 Place of Service:  SNF (31) Provider:  Mast, ManXie  NP  Blanchie Serve, MD  Patient Care Team: Blanchie Serve, MD as PCP - General (Internal Medicine) Mast, Man X, NP as Nurse Practitioner (Internal Medicine) [name], MD as Attending Physician (Neurology)  Extended Emergency Contact Information Primary Emergency Contact: Key,Caroline Address: [place] of [place] Phone: [phone] Mobile Phone: [phone] Relation: Daughter Secondary Emergency Contact: Key,Tim  [place] Mobile Phone: [phone] Relation: Son  Code Status:  DNR Goals of care: Advanced Directive information Advanced Directives 01/12/2018  Does Patient Have a Medical Advance Directive? Yes  Type of Paramedic of [place];Living will;Out of facility DNR (pink MOST or yellow form)  Does patient want to make changes to medical advance directive? No - Patient declined  Copy of [place] in Chart? Yes  Would patient like information on creating a medical advance directive? -  Pre-existing out of facility DNR order (yellow form or pink MOST form) Yellow form placed in chart (order not valid for inpatient use)     Chief Complaint  Patient presents with  . Medical Management of Chronic Issues    F/U- pirformis syndrome of the right side and dementia    HPI:  Pt is a 84 y.o. female seen today for medical management of chronic diseases.    The patient has history of dementia, resides in memory care unit [facility], self transfer, ambulates with walker for a short distance, and w/c to go further, taking Memantine 5mg  qd, Donepezil 10mg  for memory. Her psychosis is stable on Quetiapine 12.5mg  bid. Arthritic pain/pelvis fx: is stable on Meloxicam 7.5mg  qd, Tylenol 1000mg  q8h po. Blood pressure is managed on Carvedilol 6.25mg  bid, Lisinopril 10mg   qd. Hypothyroidism, stable on Levothyroxine, last TSH TSH 5.11 12/24/17. Noticed BLE edema, she denied chest pian, SOB, cough, palpitation, off Maxzide 12/21/17  Past Medical History:  Diagnosis Date  . Adenomatous colon polyp 07/17/97  . Allergic rhinitis   . Allergy   . Anxiety disorder   . Atrial fibrillation ([place])   . Chronic anticoagulation   . Diverticulosis   . History of shingles   . Hypercholesteremia   . Hypertension   . Hypothyroidism   . Internal hemorrhoids   . Melanoma ([place])   . Memory loss   . Meniere disease   . Tension headache    Past Surgical History:  Procedure Laterality Date  . CATARACT EXTRACTION Right 2012    Allergies  Allergen Reactions  . Amitriptyline     Urinary Retention  . Amlodipine Swelling  . Erythromycin Hives  . Nitrofurantoin Nausea Only  . Sudafed [Pseudoephedrine Hcl]     Unknown reaction per MAR   . Sulfa Antibiotics     "really ill"  . Tavist [Clemastine]     Urinary hesitancy  . Trimethoprim     Cramps  . Verapamil     Constipation    Outpatient Encounter Medications as of 01/17/2018  Medication Sig  . acetaminophen (TYLENOL) 325 MG tablet Take 650 mg by mouth every 6 (six) hours as needed for headache.  Marland Kitchen acetaminophen (TYLENOL) 500 MG tablet Take 1,000 mg by mouth every 8 (eight) hours.  Marland Kitchen atorvastatin (LIPITOR) 10 MG tablet Take 1 tablet (10 mg total) by mouth daily.  . Calcium-Phosphorus-Vitamin D (CITRACAL CALCIUM GUMMIES PO) Take 1 each  by mouth daily.   . carvedilol (COREG) 6.25 MG tablet Take 1 tablet (6.25 mg total) by mouth 2 (two) times daily with a meal.  . donepezil (ARICEPT) 10 MG tablet Take 10 mg by mouth at bedtime.   . feeding supplement (BOOST / RESOURCE BREEZE) LIQD Take 1 Container by mouth 2 (two) times daily between meals. Prefers Vanilla flavored  . hydrocortisone (ANUSOL-HC) 2.5 % rectal cream Place 1 application rectally 2 (two) times daily as needed for hemorrhoids.  Marland Kitchen levothyroxine (SYNTHROID,  LEVOTHROID) 50 MCG tablet Take 50 mcg by mouth See admin instructions. Only on Tuesday, Thursday, Saturday, Sunday.  . levothyroxine (SYNTHROID, LEVOTHROID) 75 MCG tablet Take 75 mcg by mouth 2 (two) times a week. Take one tablet Monday and Friday   . lisinopril (PRINIVIL,ZESTRIL) 10 MG tablet Take 10 mg by mouth daily.  . meloxicam (MOBIC) 7.5 MG tablet Take 7.5 mg by mouth daily with breakfast.  . memantine (NAMENDA) 5 MG tablet Take 1 tablet (5 mg total) by mouth daily.  . QUEtiapine (SEROQUEL) 25 MG tablet Take 12.5 mg by mouth 2 (two) times daily.   . [DISCONTINUED] acetaminophen (TYLENOL) 325 MG tablet Take 650 mg by mouth every 8 (eight) hours. Stop date 01/02/18    No facility-administered encounter medications on file as of 01/17/2018.    ROS was provided with assistance of staff Review of Systems  Constitutional: Negative for activity change, appetite change, chills, diaphoresis, fatigue and fever.  HENT: Positive for hearing loss. Negative for congestion, trouble swallowing and voice change.   Respiratory: Negative for cough, shortness of breath and wheezing.   Cardiovascular: Positive for leg swelling. Negative for chest pain and palpitations.  Gastrointestinal: Negative for abdominal distention and abdominal pain.  Genitourinary: Negative for difficulty urinating, dysuria and urgency.  Musculoskeletal: Positive for gait problem. Negative for arthralgias and back pain.  Skin: Negative for color change and pallor.  Neurological: Negative for dizziness, speech difficulty, weakness and headaches.       Dementia  Psychiatric/Behavioral: Positive for confusion. Negative for agitation, behavioral problems, hallucinations and sleep disturbance. The patient is not nervous/anxious.     Immunization History  Administered Date(s) Administered  . Influenza-Unspecified 07/19/2017  . PPD Test 09/05/2016   Pertinent  Health Maintenance Due  Topic Date Due  . DEXA SCAN  04/28/1992  . PNA  vac Low Risk Adult (1 of 2 - PCV13) 04/28/1992  . INFLUENZA VACCINE  04/28/2018   Fall Risk  10/14/2017 01/28/2016  Falls in the past year? Yes No  Number falls in past yr: 1 -  Injury with Fall? Yes -   Functional Status Survey:    Vitals:   01/17/18 1209  BP: (!) 160/74  Pulse: 80  Resp: 16  Temp: 97.8 F (36.6 C)  SpO2: 96%  Weight: 132 lb 3.2 oz (60 kg)  Height: 5\' 6"  (1.676 m)   Body mass index is 21.34 kg/m. Physical Exam  Constitutional: She appears well-developed and well-nourished.  HENT:  Head: Normocephalic and atraumatic.  Eyes: Pupils are equal, round, and reactive to light. EOM are normal.  Neck: Normal range of motion. No JVD present. No thyromegaly present.  Cardiovascular: Normal rate.  No murmur heard. Pulmonary/Chest: She has no wheezes. She has no rales.  Abdominal: Soft. Bowel sounds are normal. She exhibits no distension. There is no tenderness.  Musculoskeletal: She exhibits edema. She exhibits no tenderness.  1+ edema BLE. Self transfer, ambulates with walker with SBA, w/c to go further.  Neurological: She is alert. No cranial nerve deficit. She exhibits normal muscle tone. Coordination normal.  Oriented to person and her room on unit.   Skin: Skin is warm and dry.  Psychiatric: She has a normal mood and affect. Her behavior is normal.    Labs reviewed: Recent Labs    05/27/17 06/29/17 12/23/17  NA 137 137 136*  K 4.2 4.7 4.6  CL  --   --  105  CO2  --   --  25  BUN 29* 33* 45*  CREATININE 1.0 1.1 1.0  CALCIUM  --   --  9.2   Recent Labs    05/27/17 06/29/17 12/23/17  AST 17 24 26   ALT 12 29 26   ALKPHOS 59 82 95  PROT  --   --  5.9  ALBUMIN  --   --  3.8   Recent Labs    06/29/17 11/25/17 12/23/17  WBC 7.6 6.1 7.7  HGB 12.7 11.2* 9.9*  HCT 38 33* 29*  PLT 265 234 294   Lab Results  Component Value Date   TSH 5.11 12/23/2017   Lab Results  Component Value Date   HGBA1C 5.7 09/30/2017   Lab Results  Component Value Date     CHOL 135 12/23/2017   HDL 46 12/23/2017   LDLCALC 74 12/23/2017   TRIG 71 12/23/2017    Significant Diagnostic Results in last 30 days:  No results found.  Assessment/Plan Edema Compression hosiery knee high, med weight, on am, off pm, weigh the patient daily x 2 weeks. Off Maxzide 12/21/17  Hypothyroidism TSH 5.11 12/23/17, continue Levothyroxine,  update TSH free T3, free T4 01/23/18.   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  Alzheimer's dementia with behavioral disturbance Psychosis is stable, continue Quetiapine 12.5mg  po bid.   Piriformis syndrome of right side Right buttock pain, left pelvic fx, is stable, continue Meloxicam 7.5mg  qd, Tylenol 1000mg  q8h po, BioFreeze to R buttock for total 4 weeks.   HTN (hypertension) SBP is in 150-160, DSP in 70-80s, continue  Carvedilol 6.25mg  bid, Lisinopril 10mg  qd.     Family/ staff Communication: plan of care reviewed with the patient and charge nurse.   Labs/tests ordered:  TSH Free T3, Free T4 01/23/18 Time spend 25 minutes.

## 2018-01-17 NOTE — Assessment & Plan Note (Signed)
dementia, resides in memory care unit [facility], self transfer, ambulates with walker for a short distance, and w/c to go further, continue  Memantine 5mg  qd, Donepezil 10mg  for memory.

## 2018-01-17 NOTE — Assessment & Plan Note (Signed)
Psychosis is stable, continue Quetiapine 12.5mg  po bid.

## 2018-01-18 DIAGNOSIS — Z9181 History of falling: Secondary | ICD-10-CM | POA: Diagnosis not present

## 2018-01-18 DIAGNOSIS — M25552 Pain in left hip: Secondary | ICD-10-CM | POA: Diagnosis not present

## 2018-01-18 DIAGNOSIS — R2681 Unsteadiness on feet: Secondary | ICD-10-CM | POA: Diagnosis not present

## 2018-01-18 DIAGNOSIS — R278 Other lack of coordination: Secondary | ICD-10-CM | POA: Diagnosis not present

## 2018-01-18 DIAGNOSIS — S32502D Unspecified fracture of left pubis, subsequent encounter for fracture with routine healing: Secondary | ICD-10-CM | POA: Diagnosis not present

## 2018-01-18 DIAGNOSIS — M6281 Muscle weakness (generalized): Secondary | ICD-10-CM | POA: Diagnosis not present

## 2018-01-19 DIAGNOSIS — R278 Other lack of coordination: Secondary | ICD-10-CM | POA: Diagnosis not present

## 2018-01-19 DIAGNOSIS — M25552 Pain in left hip: Secondary | ICD-10-CM | POA: Diagnosis not present

## 2018-01-19 DIAGNOSIS — S32502D Unspecified fracture of left pubis, subsequent encounter for fracture with routine healing: Secondary | ICD-10-CM | POA: Diagnosis not present

## 2018-01-19 DIAGNOSIS — R2681 Unsteadiness on feet: Secondary | ICD-10-CM | POA: Diagnosis not present

## 2018-01-19 DIAGNOSIS — Z9181 History of falling: Secondary | ICD-10-CM | POA: Diagnosis not present

## 2018-01-19 DIAGNOSIS — M6281 Muscle weakness (generalized): Secondary | ICD-10-CM | POA: Diagnosis not present

## 2018-01-20 DIAGNOSIS — E039 Hypothyroidism, unspecified: Secondary | ICD-10-CM | POA: Diagnosis not present

## 2018-01-20 LAB — TSH: TSH: 5.22 (ref <=5.90)

## 2018-01-21 ENCOUNTER — Other Ambulatory Visit: Payer: Self-pay | Admitting: *Deleted

## 2018-01-21 LAB — T4, FREE: T4,FREE (DIRECT): 1.2

## 2018-01-21 LAB — T3, FREE: T3 FREE: 2.8

## 2018-01-24 DIAGNOSIS — M6281 Muscle weakness (generalized): Secondary | ICD-10-CM | POA: Diagnosis not present

## 2018-01-24 DIAGNOSIS — M25552 Pain in left hip: Secondary | ICD-10-CM | POA: Diagnosis not present

## 2018-01-24 DIAGNOSIS — Z9181 History of falling: Secondary | ICD-10-CM | POA: Diagnosis not present

## 2018-01-24 DIAGNOSIS — R278 Other lack of coordination: Secondary | ICD-10-CM | POA: Diagnosis not present

## 2018-01-24 DIAGNOSIS — R2681 Unsteadiness on feet: Secondary | ICD-10-CM | POA: Diagnosis not present

## 2018-01-24 DIAGNOSIS — S32502D Unspecified fracture of left pubis, subsequent encounter for fracture with routine healing: Secondary | ICD-10-CM | POA: Diagnosis not present

## 2018-02-03 ENCOUNTER — Telehealth: Payer: Self-pay | Admitting: Neurology

## 2018-02-03 ENCOUNTER — Ambulatory Visit: Payer: Medicare Other | Admitting: Adult Health

## 2018-02-03 NOTE — Telephone Encounter (Signed)
The previous appt for 02/03/18 was most likely on the AVS from the visit in September. Perhaps [facility] has a copy of that AVS? I am unsure of how this got communicated to [facility].

## 2018-02-03 NOTE — Telephone Encounter (Signed)
Pt's son in law called very upset..he said "[facility] has her ready for the appt he c/a on Monday 01/31/18 for today 5/9". He states the patient fell the 1st of April and fractured her hip. He said the pt does not remember the fall, she has become very aggressive, argumentative and "will cry now because she will be told she is not going anywhere". He said he does not understand "who told [facility] she had an appt, he is the only one that should be telling [facility] of any appointments". I told him there is not any documentation or a phone number for [facility]. All phone numbers were checked and address for the daughter who lives in [place]. I was on the phone with him for 39 minutes. I did ask him if he wanted the pt to be seen sooner than 9/18, he declined states he accepted that appointment because it would give him time to access her situation. He was very appreciative that I listened to him and that everything seemed to be in order at our office.   FYI

## 2018-02-14 ENCOUNTER — Non-Acute Institutional Stay (SKILLED_NURSING_FACILITY): Payer: Medicare Other | Admitting: Nurse Practitioner

## 2018-02-14 ENCOUNTER — Encounter: Payer: Self-pay | Admitting: Nurse Practitioner

## 2018-02-14 DIAGNOSIS — I10 Essential (primary) hypertension: Secondary | ICD-10-CM

## 2018-02-14 DIAGNOSIS — G309 Alzheimer's disease, unspecified: Secondary | ICD-10-CM | POA: Diagnosis not present

## 2018-02-14 DIAGNOSIS — F0280 Dementia in other diseases classified elsewhere without behavioral disturbance: Secondary | ICD-10-CM | POA: Diagnosis not present

## 2018-02-14 DIAGNOSIS — E039 Hypothyroidism, unspecified: Secondary | ICD-10-CM

## 2018-02-14 DIAGNOSIS — R609 Edema, unspecified: Secondary | ICD-10-CM

## 2018-02-14 DIAGNOSIS — F329 Major depressive disorder, single episode, unspecified: Secondary | ICD-10-CM | POA: Diagnosis not present

## 2018-02-14 NOTE — Assessment & Plan Note (Signed)
Stable, chronic, no change, continue compression hosiery daily when OOB.

## 2018-02-14 NOTE — Assessment & Plan Note (Addendum)
Hypothyroidism: controlled, increase Levothyroxine , 12/23/17 TSH 5.11, 01/20/18 Free T3 2.8, Free T4 1.2, TSH 5.22, update TSH 8 weeks.

## 2018-02-14 NOTE — Progress Notes (Signed)
Location:  [facility] Room Number: 105 Place of Service:  SNF (31) Provider:  Kashon Kraynak, ManXie  NP  Blanchie Serve, MD  Patient Care Team: Blanchie Serve, MD as PCP - General (Internal Medicine) Jereld Presti X, NP as Nurse Practitioner (Internal Medicine) [name], MD as Attending Physician (Neurology)  Extended Emergency Contact Information Primary Emergency Contact: Key,Caroline Address: [place] Phone: [phone] Mobile Phone: [phone] Relation: Daughter Secondary Emergency Contact: Key,Tim  [place] Mobile Phone: [phone] Relation: Son  Code Status:  DNR Goals of care: Advanced Directive information Advanced Directives 02/14/2018  Does Patient Have a Medical Advance Directive? Yes  Type of Paramedic of [place];Living will;Out of facility DNR (pink MOST or yellow form)  Does patient want to make changes to medical advance directive? No - Patient declined  Copy of [place] in Chart? Yes  Would patient like information on creating a medical advance directive? -  Pre-existing out of facility DNR order (yellow form or pink MOST form) Yellow form placed in chart (order not valid for inpatient use)     Chief Complaint  Patient presents with  . Medical Management of Chronic Issues    F/U- Dementia, hypothyroidism, Alzheimers    HPI:  Pt is a 84 y.o. female seen today for medical management of chronic diseases.     The patient has history of dementia, resides in [facility], [facility], on Quetiapine 12.5mg  bid, Memantine 5mg  qd, Donepezil 10mg  qd, self transfer, w/c to get around. Blood pressure is controlled on Lisinopril 10mg  qd, Carvedilol 6.25mg  bid. Hypothyroidism: controlled, taking Levothyroxine, last TSH 5.22 01/20/18.   Past Medical History:  Diagnosis Date  . Adenomatous colon polyp 07/17/97  . Allergic  rhinitis   . Allergy   . Anxiety disorder   . Atrial fibrillation ([place])   . Chronic anticoagulation   . Diverticulosis   . History of shingles   . Hypercholesteremia   . Hypertension   . Hypothyroidism   . Internal hemorrhoids   . Melanoma ([place])   . Memory loss   . Meniere disease   . Tension headache    Past Surgical History:  Procedure Laterality Date  . CATARACT EXTRACTION Right 2012    Allergies  Allergen Reactions  . Amitriptyline     Urinary Retention  . Amlodipine Swelling  . Erythromycin Hives  . Nitrofurantoin Nausea Only  . Sudafed [Pseudoephedrine Hcl]     Unknown reaction per MAR   . Sulfa Antibiotics     "really ill"  . Tavist [Clemastine]     Urinary hesitancy  . Trimethoprim     Cramps  . Verapamil     Constipation    Outpatient Encounter Medications as of 02/14/2018  Medication Sig  . acetaminophen (TYLENOL) 325 MG tablet Take 650 mg by mouth every 6 (six) hours as needed for headache.  Marland Kitchen acetaminophen (TYLENOL) 500 MG tablet Take 1,000 mg by mouth every 8 (eight) hours.  Marland Kitchen atorvastatin (LIPITOR) 10 MG tablet Take 1 tablet (10 mg total) by mouth daily.  . Calcium-Phosphorus-Vitamin D (CITRACAL CALCIUM GUMMIES PO) Take 1 each by mouth daily.   . carvedilol (COREG) 6.25 MG tablet Take 1 tablet (6.25 mg total) by mouth 2 (two) times daily with a meal.  . donepezil (ARICEPT) 10 MG tablet Take 10 mg by mouth at bedtime.   . feeding  supplement (BOOST / RESOURCE BREEZE) LIQD Take 1 Container by mouth 2 (two) times daily between meals. Prefers Vanilla flavored  . hydrocortisone (ANUSOL-HC) 2.5 % rectal cream Place 1 application rectally 2 (two) times daily as needed for hemorrhoids.  Marland Kitchen levothyroxine (SYNTHROID, LEVOTHROID) 50 MCG tablet Take 50 mcg by mouth See admin instructions. Only on Tuesday, Thursday, Saturday, Sunday.  . levothyroxine (SYNTHROID, LEVOTHROID) 75 MCG tablet Take 75 mcg by mouth 2 (two) times a week. Take one tablet Monday and Friday   .  lisinopril (PRINIVIL,ZESTRIL) 10 MG tablet Take 10 mg by mouth daily.  . meloxicam (MOBIC) 7.5 MG tablet Take 7.5 mg by mouth daily with breakfast.  . memantine (NAMENDA) 5 MG tablet Take 1 tablet (5 mg total) by mouth daily.  . QUEtiapine (SEROQUEL) 25 MG tablet Take 12.5 mg by mouth 2 (two) times daily.    No facility-administered encounter medications on file as of 02/14/2018.    ROS was provided with assistance of staff Review of Systems  Constitutional: Negative for activity change, appetite change, chills, diaphoresis, fatigue and fever.  HENT: Positive for hearing loss. Negative for congestion.   Respiratory: Negative for cough, shortness of breath and wheezing.   Cardiovascular: Positive for leg swelling. Negative for chest pain and palpitations.  Gastrointestinal: Negative for abdominal distention, abdominal pain, constipation, diarrhea, nausea and vomiting.  Genitourinary: Negative for difficulty urinating, dysuria and urgency.  Musculoskeletal: Positive for gait problem.  Skin: Negative for color change and pallor.  Neurological: Negative for dizziness, speech difficulty, weakness and headaches.       Dementia  Psychiatric/Behavioral: Negative for agitation, behavioral problems, hallucinations and sleep disturbance.    Immunization History  Administered Date(s) Administered  . Influenza-Unspecified 07/19/2017  . PPD Test 09/05/2016   Pertinent  Health Maintenance Due  Topic Date Due  . DEXA SCAN  04/28/1992  . PNA vac Low Risk Adult (1 of 2 - PCV13) 04/28/1992  . INFLUENZA VACCINE  04/28/2018   Fall Risk  10/14/2017 01/28/2016  Falls in the past year? Yes No  Number falls in past yr: 1 -  Injury with Fall? Yes -   Functional Status Survey:    Vitals:   02/14/18 1221  BP: (!) 156/60  Pulse: 78  Resp: 16  Temp: 98.1 F (36.7 C)  SpO2: 95%  Weight: 132 lb 12.8 oz (60.2 kg)  Height: 5\' 6"  (1.676 m)   Body mass index is 21.43 kg/m. Physical Exam    Constitutional: She appears well-developed and well-nourished.  HENT:  Head: Normocephalic and atraumatic.  Eyes: Pupils are equal, round, and reactive to light. EOM are normal.  Neck: Normal range of motion. Neck supple. No JVD present. No thyromegaly present.  Cardiovascular: Normal rate and regular rhythm.  No murmur heard. Pulmonary/Chest: She has wheezes.  Abdominal: Soft. Bowel sounds are normal. She exhibits no distension. There is no tenderness. There is no rebound and no guarding.  Musculoskeletal: She exhibits edema.  Self transfer, w/c for mobility, 1+edema BLE. Ambulates with walker with SBA occasionally.   Neurological: She is alert. No cranial nerve deficit. She exhibits normal muscle tone. Coordination normal.  Oriented to self and her room on unit.   Skin: Skin is warm and dry.  Psychiatric: She has a normal mood and affect. Her behavior is normal.    Labs reviewed: Recent Labs    05/27/17 06/29/17 12/23/17  NA 137 137 136*  K 4.2 4.7 4.6  CL  --   --  105  CO2  --   --  25  BUN 29* 33* 45*  CREATININE 1.0 1.1 1.0  CALCIUM  --   --  9.2   Recent Labs    05/27/17 06/29/17 12/23/17  AST 17 24 26   ALT 12 29 26   ALKPHOS 59 82 95  PROT  --   --  5.9  ALBUMIN  --   --  3.8   Recent Labs    06/29/17 11/25/17 12/23/17  WBC 7.6 6.1 7.7  HGB 12.7 11.2* 9.9*  HCT 38 33* 29*  PLT 265 234 294   Lab Results  Component Value Date   TSH 5.22 01/20/2018   Lab Results  Component Value Date   HGBA1C 5.7 09/30/2017   Lab Results  Component Value Date   CHOL 135 12/23/2017   HDL 46 12/23/2017   LDLCALC 74 12/23/2017   TRIG 71 12/23/2017    Significant Diagnostic Results in last 30 days:  No results found.  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   HTN (hypertension) Blood pressure is controlled,  continue  Lisinopril 10mg  qd, Carvedilol 6.25mg  bid.   Hypothyroidism Hypothyroidism: controlled, increase Levothyroxine , 12/23/17 TSH 5.11, 01/20/18 Free T3 2.8, Free T4 1.2, TSH 5.22, update TSH 8 weeks.    Edema Stable, chronic, no change, continue compression hosiery daily when OOB.      Family/ staff Communication: plan of care reviewed with the patient and charge nurse.   Labs/tests ordered: TSH 8 weeks.   Time spend 25 minutes.

## 2018-02-14 NOTE — Assessment & Plan Note (Signed)
Blood pressure is controlled, continue Lisinopril 10mg qd, Carvedilol 6.25mg bid.  

## 2018-02-14 NOTE — Assessment & Plan Note (Signed)
[facility] Care Unit, [facility], stable, continue Quetiapine 12.5mg  bid, Memantine 5mg  qd, Donepezil 10mg  qd, self transfer, w/c to get around.

## 2018-03-15 ENCOUNTER — Non-Acute Institutional Stay (SKILLED_NURSING_FACILITY): Payer: Medicare Other | Admitting: Internal Medicine

## 2018-03-15 ENCOUNTER — Encounter: Payer: Self-pay | Admitting: Internal Medicine

## 2018-03-15 DIAGNOSIS — F0281 Dementia in other diseases classified elsewhere with behavioral disturbance: Secondary | ICD-10-CM

## 2018-03-15 DIAGNOSIS — E440 Moderate protein-calorie malnutrition: Secondary | ICD-10-CM | POA: Diagnosis not present

## 2018-03-15 DIAGNOSIS — D638 Anemia in other chronic diseases classified elsewhere: Secondary | ICD-10-CM

## 2018-03-15 DIAGNOSIS — I480 Paroxysmal atrial fibrillation: Secondary | ICD-10-CM | POA: Diagnosis not present

## 2018-03-15 DIAGNOSIS — I739 Peripheral vascular disease, unspecified: Secondary | ICD-10-CM | POA: Insufficient documentation

## 2018-03-15 DIAGNOSIS — G301 Alzheimer's disease with late onset: Secondary | ICD-10-CM | POA: Diagnosis not present

## 2018-03-15 DIAGNOSIS — I10 Essential (primary) hypertension: Secondary | ICD-10-CM | POA: Diagnosis not present

## 2018-03-15 DIAGNOSIS — E039 Hypothyroidism, unspecified: Secondary | ICD-10-CM

## 2018-03-15 NOTE — Progress Notes (Signed)
Location:  [facility] Room Number: 105 Place of Service:  SNF (31) Provider:  Blanchie Serve MD  Blanchie Serve, MD  Patient Care Team: Blanchie Serve, MD as PCP - General (Internal Medicine) Mast, Man X, NP as Nurse Practitioner (Internal Medicine) [name], MD as Attending Physician (Neurology)  Extended Emergency Contact Information Primary Emergency Contact: Key,Caroline Address: [place] Phone: [phone] Mobile Phone: [phone] Relation: Daughter Secondary Emergency Contact: Key,Tim  [place] Mobile Phone: [phone] Relation: Son  Code Status:  DNR  Goals of care: Advanced Directive information Advanced Directives 02/14/2018  Does Patient Have a Medical Advance Directive? Yes  Type of Paramedic of [place];Living will;Out of facility DNR (pink MOST or yellow form)  Does patient want to make changes to medical advance directive? No - Patient declined  Copy of [place] in Chart? Yes  Would patient like information on creating a medical advance directive? -  Pre-existing out of facility DNR order (yellow form or pink MOST form) Yellow form placed in chart (order not valid for inpatient use)     Chief Complaint  Patient presents with  . Medical Management of Chronic Issues    routine follow up    HPI:  Pt is a 84 y.o. female seen today for medical management of chronic diseases. She has memory issue and resides in memory care. She is mean at times during conversation and gets angry and upset, it appears like she has an outbursts where it appears like she can attack people in her room and then immediately calms herself. She is hard of hearing and it takes some time to redirect her. She is seen in her room today with nursing present. She denies any concern. She says " I am fine, no problem, you are not my doctor,  leave the room." following this, patient follows me outside the room and appears upset and asks me to come back to her room, sit down and talk. Per nursing, she has episodes of behavioral outburst where she can be mean towards staff, yell at them, refuse care but is redirectable for most part.  Hypothyroidism- currently on levothyroxine 75 mcg daily.   Of note, denies any pain. Currently on meloxicam 7.5 mg daily. Off scheduled tylenol per family request.   Dementia with behavioral disturbance- on seroquel 12.5 mg bid, namenda 5 mg daily and aricept 10 mg daily  Protein calorie malnutrition- fair po intake per nursing. Taking feeding supplement.   Hypertension- taking lisinopril 10 mg daily with coreg 6.25 mg bid  afib- controlled HR, on coreg   Past Medical History:  Diagnosis Date  . Adenomatous colon polyp 07/17/97  . Allergic rhinitis   . Allergy   . Anxiety disorder   . Atrial fibrillation ([place])   . Chronic anticoagulation   . Diverticulosis   . History of shingles   . Hypercholesteremia   . Hypertension   . Hypothyroidism   . Internal hemorrhoids   . Melanoma ([place])   . Memory loss   . Meniere disease   . Tension headache    Past Surgical History:  Procedure Laterality Date  . CATARACT EXTRACTION Right 2012    Allergies  Allergen Reactions  . Amitriptyline     Urinary Retention  . Amlodipine Swelling  . Erythromycin Hives  . Nitrofurantoin Nausea Only  . Sudafed [Pseudoephedrine Hcl]  Unknown reaction per MAR   . Sulfa Antibiotics     "really ill"  . Tavist [Clemastine]     Urinary hesitancy  . Trimethoprim     Cramps  . Verapamil     Constipation    Outpatient Encounter Medications as of 03/15/2018  Medication Sig  . atorvastatin (LIPITOR) 10 MG tablet Take 1 tablet (10 mg total) by mouth daily.  . Calcium-Phosphorus-Vitamin D (CITRACAL CALCIUM GUMMIES PO) Take 1 each by mouth daily.   . carvedilol (COREG) 6.25 MG tablet Take 1 tablet (6.25 mg  total) by mouth 2 (two) times daily with a meal.  . donepezil (ARICEPT) 10 MG tablet Take 10 mg by mouth at bedtime.   . feeding supplement (BOOST / RESOURCE BREEZE) LIQD Take 1 Container by mouth 2 (two) times daily between meals. Prefers Vanilla flavored  . hydrocortisone (ANUSOL-HC) 2.5 % rectal cream Place 1 application rectally 2 (two) times daily as needed for hemorrhoids.  Marland Kitchen levothyroxine (SYNTHROID, LEVOTHROID) 75 MCG tablet Take 75 mcg by mouth daily.  Marland Kitchen lisinopril (PRINIVIL,ZESTRIL) 10 MG tablet Take 10 mg by mouth daily.  . meloxicam (MOBIC) 7.5 MG tablet Take 7.5 mg by mouth daily with breakfast.  . memantine (NAMENDA) 5 MG tablet Take 1 tablet (5 mg total) by mouth daily.  . QUEtiapine (SEROQUEL) 25 MG tablet Take 12.5 mg by mouth 2 (two) times daily.   . [DISCONTINUED] acetaminophen (TYLENOL) 325 MG tablet Take 650 mg by mouth every 6 (six) hours as needed for headache.  . [DISCONTINUED] acetaminophen (TYLENOL) 500 MG tablet Take 1,000 mg by mouth every 8 (eight) hours.  . [DISCONTINUED] levothyroxine (SYNTHROID, LEVOTHROID) 50 MCG tablet Take 50 mcg by mouth See admin instructions. Only on Tuesday, Thursday, Saturday, Sunday.   No facility-administered encounter medications on file as of 03/15/2018.     Review of Systems  Unable to perform ROS: Dementia (limited with dementia and behavior changes)  Constitutional: Negative for appetite change and fever.  HENT: Negative for congestion.   Respiratory: Negative for cough and shortness of breath.   Cardiovascular: Negative for chest pain and palpitations.  Gastrointestinal: Negative for abdominal pain, nausea and vomiting.  Genitourinary: Negative for dysuria.  Musculoskeletal: Negative for arthralgias, back pain and gait problem.  Skin: Negative for rash and wound.  Neurological: Negative for dizziness, weakness and headaches.  Psychiatric/Behavioral: Positive for confusion. The patient is nervous/anxious and is hyperactive.      Immunization History  Administered Date(s) Administered  . Influenza-Unspecified 07/19/2017  . PPD Test 09/05/2016   Pertinent  Health Maintenance Due  Topic Date Due  . DEXA SCAN  04/28/1992  . PNA vac Low Risk Adult (1 of 2 - PCV13) 04/28/1992  . INFLUENZA VACCINE  04/28/2018   Fall Risk  10/14/2017 01/28/2016  Falls in the past year? Yes No  Number falls in past yr: 1 -  Injury with Fall? Yes -   Functional Status Survey:    Vitals:   03/15/18 1535  BP: 128/70  Pulse: 72  Resp: 16  Temp: 98.2 F (36.8 C)  Weight: 128 lb 3.2 oz (58.2 kg)  Height: 5\' 6"  (1.676 m)   Body mass index is 20.69 kg/m.   Wt Readings from Last 3 Encounters:  03/15/18 128 lb 3.2 oz (58.2 kg)  02/14/18 132 lb 12.8 oz (60.2 kg)  01/17/18 132 lb 3.2 oz (60 kg)   Physical Exam  Constitutional: She is oriented to person, place, and time.  Thin built, elderly female  in NAD  HENT:  Head: Normocephalic and atraumatic.  Mouth/Throat: Oropharynx is clear and moist.  Eyes: Pupils are equal, round, and reactive to light. Conjunctivae and EOM are normal. Right eye exhibits no discharge. Left eye exhibits no discharge.  Neck: Normal range of motion. Neck supple.  Cardiovascular: Normal rate and regular rhythm.  Pulmonary/Chest: Effort normal and breath sounds normal. She has no wheezes. She has no rales.  Abdominal: Soft. Bowel sounds are normal. There is no tenderness.  Musculoskeletal: Normal range of motion. She exhibits edema.  Trace pitting edema, unsteady gait during position change but does not use assistive device  Lymphadenopathy:    She has no cervical adenopathy.  Neurological: She is alert and oriented to person, place, and time. She exhibits normal muscle tone.  Skin: Skin is warm and dry.  Psychiatric:  Needs redirection, gets upset for unclear reasons and is abrupt and mean towards staff    Labs reviewed: Recent Labs    05/27/17 06/29/17 12/23/17  NA 137 137 136*  K 4.2 4.7  4.6  CL  --   --  105  CO2  --   --  25  BUN 29* 33* 45*  CREATININE 1.0 1.1 1.0  CALCIUM  --   --  9.2   Recent Labs    05/27/17 06/29/17 12/23/17  AST 17 24 26   ALT 12 29 26   ALKPHOS 59 82 95  PROT  --   --  5.9  ALBUMIN  --   --  3.8   Recent Labs    06/29/17 11/25/17 12/23/17  WBC 7.6 6.1 7.7  HGB 12.7 11.2* 9.9*  HCT 38 33* 29*  PLT 265 234 294   Lab Results  Component Value Date   TSH 5.22 01/20/2018   Lab Results  Component Value Date   HGBA1C 5.7 09/30/2017   Lab Results  Component Value Date   CHOL 135 12/23/2017   HDL 46 12/23/2017   LDLCALC 74 12/23/2017   TRIG 71 12/23/2017    Significant Diagnostic Results in last 30 days:  No results found.  Assessment/Plan  1. Anemia of chronic disease Check CBC  2. Paroxysmal atrial fibrillation (HCC) Continue coreg, not on anticoagulation, monitor clinically  3. Hypothyroidism, unspecified type Check TSH and free T4, continue levothyroxine 75 mcg daily  4. Late onset Alzheimer's disease with behavioral disturbance Continue aricept and memantine. Increase seroquel from 12.5 mg bid to seroquel 12.5 mg am and 25 mg qhs for now and monitor, supportive care.   5. PVD (peripheral vascular disease) (HCC) Keep legs elevated at rest, ted hose if agrees, denies any pain, change meloxicam to daily as needed for now and can d/c if does not require it  6. Moderate protein-calorie malnutrition ([place]) Supportive care, nutritional supplement  7. Essential hypertension Continue lisinopril, check bmp, monitor BP    Family/ staff Communication: reviewed care plan with patient and charge nurse.    Labs/tests ordered:  CBC, CMP, TSH, freeT4 03/22/18   Blanchie Serve, MD Internal Medicine [facility] [address] [place], [place] [address] Cell Phone (Monday-Friday 8 am - 5 pm): [phone] On Call: [phone] and follow prompts after 5 pm and on weekends Office Phone:  [phone] Office Fax: [phone]

## 2018-03-22 ENCOUNTER — Other Ambulatory Visit: Payer: Self-pay | Admitting: *Deleted

## 2018-03-22 DIAGNOSIS — I10 Essential (primary) hypertension: Secondary | ICD-10-CM | POA: Diagnosis not present

## 2018-03-22 DIAGNOSIS — E440 Moderate protein-calorie malnutrition: Secondary | ICD-10-CM | POA: Diagnosis not present

## 2018-03-22 DIAGNOSIS — D638 Anemia in other chronic diseases classified elsewhere: Secondary | ICD-10-CM | POA: Diagnosis not present

## 2018-03-22 DIAGNOSIS — E039 Hypothyroidism, unspecified: Secondary | ICD-10-CM | POA: Diagnosis not present

## 2018-03-22 DIAGNOSIS — I739 Peripheral vascular disease, unspecified: Secondary | ICD-10-CM | POA: Diagnosis not present

## 2018-03-22 MED ORDER — LISINOPRIL 10 MG PO TABS: 10.0000 mg | ORAL_TABLET | Freq: Every day | ORAL | 1 refills | Status: DC

## 2018-04-12 DIAGNOSIS — E039 Hypothyroidism, unspecified: Secondary | ICD-10-CM | POA: Diagnosis not present

## 2018-04-12 LAB — TSH: TSH: 3.28 (ref 0.41–5.90)

## 2018-04-14 ENCOUNTER — Encounter: Payer: Self-pay | Admitting: Internal Medicine

## 2018-04-14 ENCOUNTER — Non-Acute Institutional Stay (SKILLED_NURSING_FACILITY): Payer: Medicare Other | Admitting: Internal Medicine

## 2018-04-14 DIAGNOSIS — F0281 Dementia in other diseases classified elsewhere with behavioral disturbance: Secondary | ICD-10-CM

## 2018-04-14 DIAGNOSIS — G301 Alzheimer's disease with late onset: Secondary | ICD-10-CM

## 2018-04-14 DIAGNOSIS — E782 Mixed hyperlipidemia: Secondary | ICD-10-CM

## 2018-04-14 DIAGNOSIS — F02818 Dementia in other diseases classified elsewhere, unspecified severity, with other behavioral disturbance: Secondary | ICD-10-CM

## 2018-04-14 DIAGNOSIS — E039 Hypothyroidism, unspecified: Secondary | ICD-10-CM | POA: Diagnosis not present

## 2018-04-14 DIAGNOSIS — I480 Paroxysmal atrial fibrillation: Secondary | ICD-10-CM

## 2018-04-14 NOTE — Progress Notes (Signed)
Location:  [facility] Room Number: 105 Place of Service:  SNF (31) Provider:  Blanchie Serve MD  Blanchie Serve, MD  Patient Care Team: Blanchie Serve, MD as PCP - General (Internal Medicine) Mast, Man X, NP as Nurse Practitioner (Internal Medicine) [name], MD as Attending Physician (Neurology)  Extended Emergency Contact Information Primary Emergency Contact: Key,Caroline Address: [place] Phone: [phone] Mobile Phone: [phone] Relation: Daughter Secondary Emergency Contact: Key,Tim  [place] Mobile Phone: [phone] Relation: Son  Code Status:  DNR  Goals of care: Advanced Directive information Advanced Directives 04/14/2018  Does Patient Have a Medical Advance Directive? Yes  Type of Paramedic of [place];Living will;Out of facility DNR (pink MOST or yellow form)  Does patient want to make changes to medical advance directive? No - Patient declined  Copy of [place] in Chart? Yes  Would patient like information on creating a medical advance directive? -  Pre-existing out of facility DNR order (yellow form or pink MOST form) Yellow form placed in chart (order not valid for inpatient use)     Chief Complaint  Patient presents with  . Medical Management of Chronic Issues    Routine Visit     HPI:  Pt is a 84 y.o. female seen today for medical management of chronic diseases. She resides in memory care unit. She denies any acute health issue. She is pleasantly confused.      Past Medical History:  Diagnosis Date  . Adenomatous colon polyp 07/17/97  . Allergic rhinitis   . Allergy   . Anxiety disorder   . Atrial fibrillation ([place])   . Chronic anticoagulation   . Diverticulosis   . History of shingles   . Hypercholesteremia   . Hypertension   . Hypothyroidism   . Internal hemorrhoids   . Melanoma  ([place])   . Memory loss   . Meniere disease   . Tension headache    Past Surgical History:  Procedure Laterality Date  . CATARACT EXTRACTION Right 2012    Allergies  Allergen Reactions  . Amitriptyline     Urinary Retention  . Amlodipine Swelling  . Erythromycin Hives  . Nitrofurantoin Nausea Only  . Sudafed [Pseudoephedrine Hcl]     Unknown reaction per MAR   . Sulfa Antibiotics     "really ill"  . Tavist [Clemastine]     Urinary hesitancy  . Trimethoprim     Cramps  . Verapamil     Constipation    Outpatient Encounter Medications as of 04/14/2018  Medication Sig  . acetaminophen (TYLENOL) 325 MG tablet Take 650 mg by mouth every 6 (six) hours as needed.  Marland Kitchen atorvastatin (LIPITOR) 10 MG tablet Take 1 tablet (10 mg total) by mouth daily.  . Calcium-Phosphorus-Vitamin D (CITRACAL CALCIUM GUMMIES PO) Take 1 each by mouth 2 (two) times daily.   . carvedilol (COREG) 6.25 MG tablet Take 1 tablet (6.25 mg total) by mouth 2 (two) times daily with a meal.  . donepezil (ARICEPT) 10 MG tablet Take 10 mg by mouth at bedtime.   . feeding supplement (BOOST / RESOURCE BREEZE) LIQD Take 1 Container by mouth at bedtime. Prefers Vanilla flavored  . hydrocortisone (ANUSOL-HC) 2.5 % rectal cream Place 1 application rectally 2 (two) times daily as needed for hemorrhoids.  Marland Kitchen levothyroxine (SYNTHROID, LEVOTHROID) 75 MCG tablet Take 75 mcg by  mouth daily.  Marland Kitchen lisinopril (PRINIVIL,ZESTRIL) 10 MG tablet Take 1 tablet (10 mg total) by mouth daily.  . meloxicam (MOBIC) 7.5 MG tablet Take 7.5 mg by mouth as needed.   . memantine (NAMENDA) 5 MG tablet Take 1 tablet (5 mg total) by mouth daily.  . QUEtiapine (SEROQUEL) 25 MG tablet Take 12.5 mg by mouth daily.   . QUEtiapine (SEROQUEL) 25 MG tablet Take 25 mg by mouth at bedtime.   No facility-administered encounter medications on file as of 04/14/2018.     Review of Systems  Constitutional: Negative for appetite change, chills and fever.  HENT:  Positive for hearing loss. Negative for congestion, ear discharge, ear pain, sinus pressure and trouble swallowing.   Respiratory: Negative for cough and shortness of breath.   Cardiovascular: Negative for chest pain, palpitations and leg swelling.  Gastrointestinal: Negative for abdominal pain, constipation, diarrhea, nausea and vomiting.  Genitourinary: Negative for dysuria and hematuria.  Musculoskeletal: Positive for gait problem. Negative for arthralgias.  Skin: Negative for rash.  Neurological: Negative for dizziness and headaches.  Psychiatric/Behavioral: Positive for behavioral problems and confusion.    Immunization History  Administered Date(s) Administered  . Influenza-Unspecified 07/19/2017  . PPD Test 09/05/2016   Pertinent  Health Maintenance Due  Topic Date Due  . DEXA SCAN  04/28/1992  . PNA vac Low Risk Adult (1 of 2 - PCV13) 04/28/1992  . INFLUENZA VACCINE  04/28/2018   Fall Risk  10/14/2017 01/28/2016  Falls in the past year? Yes No  Number falls in past yr: 1 -  Injury with Fall? Yes -   Functional Status Survey:    Vitals:   04/14/18 1350  BP: (!) 158/78  Pulse: 76  Resp: 18  Temp: 97.8 F (36.6 C)  TempSrc: Oral  SpO2: 98%  Weight: 132 lb 6.4 oz (60.1 kg)  Height: 5\' 1"  (1.549 m)   Body mass index is 25.02 kg/m.   Wt Readings from Last 3 Encounters:  04/14/18 132 lb 6.4 oz (60.1 kg)  03/15/18 128 lb 3.2 oz (58.2 kg)  02/14/18 132 lb 12.8 oz (60.2 kg)   Physical Exam  Constitutional: She appears well-developed and well-nourished. No distress.  HENT:  Head: Normocephalic and atraumatic.  Mouth/Throat: Oropharynx is clear and moist.  Eyes: Pupils are equal, round, and reactive to light. Conjunctivae and EOM are normal. Right eye exhibits no discharge. Left eye exhibits no discharge.  Neck: Normal range of motion. Neck supple.  Cardiovascular: Normal rate and regular rhythm.  Pulmonary/Chest: Effort normal and breath sounds normal. She has no  wheezes. She has no rales.  Abdominal: Soft. Bowel sounds are normal. There is no tenderness. There is no guarding.  Musculoskeletal: Normal range of motion. She exhibits no edema.  Lymphadenopathy:    She has no cervical adenopathy.  Neurological: She is alert.  Oriented to self and place  Skin: Skin is warm and dry. She is not diaphoretic.  Psychiatric: She has a normal mood and affect.    Labs reviewed: Recent Labs    05/27/17 06/29/17 12/23/17  NA 137 137 136*  K 4.2 4.7 4.6  CL  --   --  105  CO2  --   --  25  BUN 29* 33* 45*  CREATININE 1.0 1.1 1.0  CALCIUM  --   --  9.2   Recent Labs    05/27/17 06/29/17 12/23/17  AST 17 24 26   ALT 12 29 26   ALKPHOS 59 82 95  PROT  --   --  5.9  ALBUMIN  --   --  3.8   Recent Labs    06/29/17 11/25/17 12/23/17  WBC 7.6 6.1 7.7  HGB 12.7 11.2* 9.9*  HCT 38 33* 29*  PLT 265 234 294   Lab Results  Component Value Date   TSH 3.28 04/12/2018   Lab Results  Component Value Date   HGBA1C 5.7 09/30/2017   Lab Results  Component Value Date   CHOL 135 12/23/2017   HDL 46 12/23/2017   LDLCALC 74 12/23/2017   TRIG 71 12/23/2017    Significant Diagnostic Results in last 30 days:  No results found.  Assessment/Plan  1. Paroxysmal atrial fibrillation (HCC) Continue coreg 6.25 mg bid. Continue to monitor  2. Late onset Alzheimer's disease with behavioral disturbance Continue seroquel current regimen and memantine with donepezil daily.   3. Acquired hypothyroidism Lab Results  Component Value Date   TSH 3.28 04/12/2018   Continue levothyroxine 75 mcg daily  4. Mixed hyperlipidemia Lipid Panel     Component Value Date/Time   CHOL 135 12/23/2017   TRIG 71 12/23/2017   HDL 46 12/23/2017   LDLCALC 74 12/23/2017   Continue atorvastatin 10 mg daily and monitor   Family/ staff Communication: reviewed care plan with patient and charge nurse.    Labs/tests ordered:  none   Blanchie Serve, MD Internal  Medicine [facility] [address] [place], [place] [address] Cell Phone (Monday-Friday 8 am - 5 pm): [phone] On Call: [phone] and follow prompts after 5 pm and on weekends Office Phone: [phone] Office Fax: [phone]

## 2018-05-11 ENCOUNTER — Non-Acute Institutional Stay (SKILLED_NURSING_FACILITY): Payer: Medicare Other | Admitting: Nurse Practitioner

## 2018-05-11 ENCOUNTER — Encounter: Payer: Self-pay | Admitting: Internal Medicine

## 2018-05-11 ENCOUNTER — Encounter: Payer: Self-pay | Admitting: Nurse Practitioner

## 2018-05-11 DIAGNOSIS — G301 Alzheimer's disease with late onset: Secondary | ICD-10-CM | POA: Diagnosis not present

## 2018-05-11 DIAGNOSIS — F0281 Dementia in other diseases classified elsewhere with behavioral disturbance: Secondary | ICD-10-CM | POA: Diagnosis not present

## 2018-05-11 DIAGNOSIS — E870 Hyperosmolality and hypernatremia: Secondary | ICD-10-CM | POA: Diagnosis not present

## 2018-05-11 DIAGNOSIS — I10 Essential (primary) hypertension: Secondary | ICD-10-CM

## 2018-05-11 DIAGNOSIS — D649 Anemia, unspecified: Secondary | ICD-10-CM | POA: Insufficient documentation

## 2018-05-11 NOTE — Progress Notes (Signed)
Location:  [facility] Room Number: 105 Place of Service:  SNF (31) Provider:  , Xie  NP  Pandey, Mahima, MD  Patient Care Team: Pandey, Mahima, MD as PCP - General (Internal Medicine) ,  X, NP as Nurse Practitioner (Internal Medicine) Athar, Saima, MD as Attending Physician (Neurology)  Extended Emergency Contact Information Primary Emergency Contact: Key,Caroline Address: [address]          [place] Home Phone: [phone] Mobile Phone: [phone] Relation: Daughter Secondary Emergency Contact: Key,Tim  [place] Mobile Phone: [phone] Relation: Son  Code Status:  DNR Goals of care: Advanced Directive information Advanced Directives 05/11/2018  Does Patient Have a Medical Advance Directive? Yes  Type of Advance Directive Healthcare Power of Attorney;Living will;Out of facility DNR (pink MOST or yellow form)  Does patient want to make changes to medical advance directive? No - Patient declined  Copy of Healthcare Power of Attorney in Chart? Yes  Would patient like information on creating a medical advance directive? -  Pre-existing out of facility DNR order (yellow form or pink MOST form) Yellow form placed in chart (order not valid for inpatient use)     Chief Complaint  Patient presents with  . Medical agement of Chronic Issues    F/u-PAF, Alzheimers, Hypothyrodism,    HPI:  Pt is a 84 y.o. female seen today for medical management of chronic diseases.     The patient resides in memory care unit [facility] for care assistance due to her dementia, on Mematnine 5mg qd, Donepezil 10mg qd for memory. Her 12/23/17 136, 12/23/17 Hgb 9.9,behaviors is well managed with Seroquel 12.5mg qd, 25mg qd. Hx of HTN, blood pressure is controlled on Lisinopril 10mg qd, Carvedilol 6.25mg bid. No pain reported, prn Meloxicam was not needed.   Past Medical History:  Diagnosis Date  . Adenomatous  colon polyp 07/17/97  . Allergic rhinitis   . Allergy   . Anxiety disorder   . Atrial fibrillation ([facility])   . Chronic anticoagulation   . Diverticulosis   . History of shingles   . Hypercholesteremia   . Hypertension   . Hypothyroidism   . Internal hemorrhoids   . Melanoma ([facility])   . Memory loss   . Meniere disease   . Tension headache    Past Surgical History:  Procedure Laterality Date  . CATARACT EXTRACTION Right 2012    Allergies  Allergen Reactions  . Amitriptyline     Urinary Retention  . Amlodipine Swelling  . Erythromycin Hives  . Nitrofurantoin Nausea Only  . Sudafed [Pseudoephedrine Hcl]     Unknown reaction per MAR   . Sulfa Antibiotics     "really ill"  . Tavist [Clemastine]     Urinary hesitancy  . Trimethoprim     Cramps  . Verapamil     Constipation    Outpatient Encounter Medications as of 05/11/2018  Medication Sig  . acetaminophen (TYLENOL) 325 MG tablet Take 650 mg by mouth every 6 (six) hours as needed.  . atorvastatin (LIPITOR) 10 MG tablet Take 1 tablet (10 mg total) by mouth daily.  . Calcium-Phosphorus-Vitamin D (CITRACAL CALCIUM GUMMIES PO) Take 1 each by mouth 2 (two) times daily.   . carvedilol (COREG) 6.25 MG tablet Take 1 tablet (6.25 mg total) by mouth 2 (two) times daily with a meal.  . donepezil (ARICEPT) 10 MG tablet Take 10 mg by mouth at bedtime.   . feeding supplement (BOOST /   RESOURCE BREEZE) LIQD Take 1 Container by mouth at bedtime. Prefers Vanilla flavored  . hydrocortisone (ANUSOL-HC) 2.5 % rectal cream Place 1 application rectally 2 (two) times daily as needed for hemorrhoids.  Marland Kitchen levothyroxine (SYNTHROID, LEVOTHROID) 75 MCG tablet Take 75 mcg by mouth daily.  Marland Kitchen lisinopril (PRINIVIL,ZESTRIL) 10 MG tablet Take 1 tablet (10 mg total) by mouth daily.  . meloxicam (MOBIC) 7.5 MG tablet Take 7.5 mg by mouth as needed.   . memantine (NAMENDA) 5 MG tablet Take 1 tablet (5 mg total) by mouth daily.  . QUEtiapine (SEROQUEL) 25 MG  tablet Take 12.5 mg by mouth daily.   . QUEtiapine (SEROQUEL) 25 MG tablet Take 25 mg by mouth at bedtime.   No facility-administered encounter medications on file as of 05/11/2018.    ROS was provided with assistance of staff Review of Systems  Constitutional: Negative for activity change, appetite change, chills, diaphoresis and fatigue.  HENT: Positive for hearing loss. Negative for trouble swallowing and voice change.   Respiratory: Negative for cough, shortness of breath and wheezing.   Cardiovascular: Negative for chest pain, palpitations and leg swelling.  Gastrointestinal: Negative for abdominal distention and abdominal pain.  Genitourinary: Negative for difficulty urinating, dysuria and urgency.  Musculoskeletal: Positive for arthralgias and gait problem.  Skin: Negative for color change and pallor.  Neurological: Negative for dizziness, speech difficulty, weakness and headaches.       Dementia  Psychiatric/Behavioral: Positive for behavioral problems and confusion. Negative for hallucinations and sleep disturbance. The patient is not nervous/anxious.     Immunization History  Administered Date(s) Administered  . Influenza-Unspecified 07/19/2017  . PPD Test 09/05/2016   Pertinent  Health Maintenance Due  Topic Date Due  . DEXA SCAN  04/28/1992  . PNA vac Low Risk Adult (1 of 2 - PCV13) 04/28/1992  . INFLUENZA VACCINE  04/28/2018   Fall Risk  10/14/2017 01/28/2016  Falls in the past year? Yes No  Number falls in past yr: 1 -  Injury with Fall? Yes -   Functional Status Survey:    Vitals:   05/11/18 1312  BP: 140/60  Pulse: 72  Resp: 17  Temp: 98.4 F (36.9 C)  SpO2: 98%  Weight: 125 lb (56.7 kg)  Height: 5' 1" (1.549 m)   Body mass index is 23.62 kg/m. Physical Exam  Constitutional: She appears well-developed and well-nourished.  HENT:  Head: Normocephalic and atraumatic.  Eyes: Pupils are equal, round, and reactive to light. EOM are normal.  Neck: Normal  range of motion. Neck supple. No JVD present. No thyromegaly present.  Cardiovascular: Normal rate and regular rhythm.  No murmur heard. Pulmonary/Chest: Effort normal and breath sounds normal. She has no wheezes. She has no rales.  Abdominal: Soft. Bowel sounds are normal. She exhibits no distension. There is no tenderness.  Musculoskeletal: She exhibits edema.  Trace edema BLE. Self transfer, ambulates with walker.   Neurological: She is alert. No cranial nerve deficit. She exhibits normal muscle tone. Coordination normal.  Oriented to self and her room on unit.   Skin: Skin is warm and dry.  Psychiatric: She has a normal mood and affect.    Labs reviewed: Recent Labs    05/27/17 06/29/17 12/23/17  NA 137 137 136*  K 4.2 4.7 4.6  CL  --   --  105  CO2  --   --  25  BUN 29* 33* 45*  CREATININE 1.0 1.1 1.0  CALCIUM  --   --  9.2   Recent Labs    05/27/17 06/29/17 12/23/17  AST 17 24 26  ALT 12 29 26  ALKPHOS 59 82 95  PROT  --   --  5.9  ALBUMIN  --   --  3.8   Recent Labs    06/29/17 11/25/17 12/23/17  WBC 7.6 6.1 7.7  HGB 12.7 11.2* 9.9*  HCT 38 33* 29*  PLT 265 234 294   Lab Results  Component Value Date   TSH 3.28 04/12/2018   Lab Results  Component Value Date   HGBA1C 5.7 09/30/2017   Lab Results  Component Value Date   CHOL 135 12/23/2017   HDL 46 12/23/2017   LDLCALC 74 12/23/2017   TRIG 71 12/23/2017    Significant Diagnostic Results in last 30 days:  No results found.  Assessment/Plan HTN (hypertension) Controlled blood pressure, continue Lisinopril 10mg qd, Carvedilol 6.25mg bid.   Alzheimer's dementia with behavioral disturbance Continue memory care unit for care assistance, continue Memantine 5mg qd, Donepezil 10mg qd, Seroquel 25mg qd/12.5mg qd.   Anemia Hgb 9.9 12/23/17, update CBC  Hypernatremia Na 136 12/23/17, will update CMP/dGFR     Family/ staff Communication: plan of care reviewed with the patient and charge nurse.    Labs/tests ordered:  CBC, CMP/eGFR  Time spend 25 minutes.    

## 2018-05-11 NOTE — Assessment & Plan Note (Signed)
Controlled blood pressure, continue Lisinopril 10mg  qd, Carvedilol 6.25mg  bid.

## 2018-05-11 NOTE — Assessment & Plan Note (Signed)
Hgb 9.9 12/23/17, update CBC

## 2018-05-11 NOTE — Assessment & Plan Note (Signed)
Na 136 12/23/17, will update CMP/dGFR

## 2018-05-11 NOTE — Assessment & Plan Note (Signed)
Continue memory care unit for care assistance, continue Memantine 5mg  qd, Donepezil 10mg  qd, Seroquel 25mg  qd/12.5mg  qd.

## 2018-05-12 ENCOUNTER — Other Ambulatory Visit: Payer: Self-pay | Admitting: *Deleted

## 2018-05-12 DIAGNOSIS — E870 Hyperosmolality and hypernatremia: Secondary | ICD-10-CM | POA: Diagnosis not present

## 2018-05-12 DIAGNOSIS — D649 Anemia, unspecified: Secondary | ICD-10-CM | POA: Diagnosis not present

## 2018-05-12 LAB — COMPLETE METABOLIC PANEL WITH GFR
Albumin: 3.7
CALCIUM: 9.3
Carbon Dioxide, Total: 29
Chloride: 105
GFR CALC NON AF AMER: 50
Globulin: 1.6
Total Protein: 5.3 g/dL

## 2018-05-12 LAB — BASIC METABOLIC PANEL
BUN: 28 — AB (ref 4–21)
Creatinine: 1 (ref <=1.1)
Glucose: 84
Potassium: 4 (ref 3.4–5.3)
Sodium: 140 (ref 137–147)

## 2018-05-12 LAB — HEPATIC FUNCTION PANEL
ALK PHOS: 73 (ref 25–125)
ALT: 8 (ref 7–35)
AST: 15 (ref 13–35)
BILIRUBIN, TOTAL: 0.4

## 2018-05-12 LAB — CBC AND DIFFERENTIAL
HEMATOCRIT: 32 — AB (ref 36–46)
Hemoglobin: 10.3 — AB (ref 12.0–16.0)
Platelets: 210 (ref 150–399)
WBC: 6.7

## 2018-06-08 ENCOUNTER — Encounter: Payer: Self-pay | Admitting: Nurse Practitioner

## 2018-06-08 ENCOUNTER — Non-Acute Institutional Stay (SKILLED_NURSING_FACILITY): Payer: Medicare Other | Admitting: Nurse Practitioner

## 2018-06-08 DIAGNOSIS — E039 Hypothyroidism, unspecified: Secondary | ICD-10-CM | POA: Diagnosis not present

## 2018-06-08 DIAGNOSIS — G301 Alzheimer's disease with late onset: Secondary | ICD-10-CM | POA: Diagnosis not present

## 2018-06-08 DIAGNOSIS — I10 Essential (primary) hypertension: Secondary | ICD-10-CM | POA: Diagnosis not present

## 2018-06-08 DIAGNOSIS — F0281 Dementia in other diseases classified elsewhere with behavioral disturbance: Secondary | ICD-10-CM | POA: Diagnosis not present

## 2018-06-08 DIAGNOSIS — I739 Peripheral vascular disease, unspecified: Secondary | ICD-10-CM

## 2018-06-08 DIAGNOSIS — F02818 Dementia in other diseases classified elsewhere, unspecified severity, with other behavioral disturbance: Secondary | ICD-10-CM

## 2018-06-08 NOTE — Assessment & Plan Note (Signed)
swelling in BLE, stable, minimal.

## 2018-06-08 NOTE — Progress Notes (Signed)
Location:  [facility] Room Number: 105 Place of Service:  SNF (31) Provider:  Zyir Gassert, ManXie  NP  Blanchie Serve, MD  Patient Care Team: Blanchie Serve, MD as PCP - General (Internal Medicine) Torez Beauregard X, NP as Nurse Practitioner (Internal Medicine) [name], MD as Attending Physician (Neurology)  Extended Emergency Contact Information Primary Emergency Contact: Key,Caroline Address: [place] Phone: [phone] Mobile Phone: [phone] Relation: Daughter Secondary Emergency Contact: Key,Tim  [place] Mobile Phone: [phone] Relation: Son  Code Status:  DNR Goals of care: Advanced Directive information Advanced Directives 06/08/2018  Does Patient Have a Medical Advance Directive? Yes  Type of Paramedic of [place];Living will;Out of facility DNR (pink MOST or yellow form)  Does patient want to make changes to medical advance directive? No - Patient declined  Copy of [place] in Chart? Yes  Would patient like information on creating a medical advance directive? -  Pre-existing out of facility DNR order (yellow form or pink MOST form) Yellow form placed in chart (order not valid for inpatient use)     Chief Complaint  Patient presents with  . Medical Management of Chronic Issues    F/u- alzheimers, anemia, HTN, hypernatremia    HPI:  Pt is a 84 y.o. female seen today for medical management of chronic diseases.     The patient has history of swelling in BLE, stable, minimal. She resides in Memory care unit [facility] for safety and ADL assistance. She transfers self, ambulates with walker, on Donepezil 10mg  qd, Memantine 5mg  qd, Quetiapine 25mg  qd, 12.5mg  qd for memory and psychosis. Hx of HTN, blood pressure is controlled on Lisinopril 10mg  qd, Carvedilol 6.35mg  bid. Taking Atorvastatin 10mg  for cardiovascular risk reduction.  She takes Levothyroxine 7mcg qd for hypothyroidism, last TSH 3.28 04/12/18 Past Medical History:  Diagnosis Date  . Adenomatous colon polyp 07/17/97  . Allergic rhinitis   . Allergy   . Anxiety disorder   . Atrial fibrillation ([place])   . Chronic anticoagulation   . Diverticulosis   . History of shingles   . Hypercholesteremia   . Hypertension   . Hypothyroidism   . Internal hemorrhoids   . Melanoma ([place])   . Memory loss   . Meniere disease   . Tension headache    Past Surgical History:  Procedure Laterality Date  . CATARACT EXTRACTION Right 2012    Allergies  Allergen Reactions  . Amitriptyline     Urinary Retention  . Amlodipine Swelling  . Erythromycin Hives  . Nitrofurantoin Nausea Only  . Sudafed [Pseudoephedrine Hcl]     Unknown reaction per MAR   . Sulfa Antibiotics     "really ill"  . Tavist [Clemastine]     Urinary hesitancy  . Trimethoprim     Cramps  . Verapamil     Constipation    Outpatient Encounter Medications as of 06/08/2018  Medication Sig  . acetaminophen (TYLENOL) 325 MG tablet Take 650 mg by mouth every 6 (six) hours as needed.  Marland Kitchen atorvastatin (LIPITOR) 10 MG tablet Take 1 tablet (10 mg total) by mouth daily.  . Calcium-Phosphorus-Vitamin D (CITRACAL CALCIUM GUMMIES PO) Take 1 each by mouth 2 (two) times daily.   . carvedilol (COREG) 6.25 MG tablet Take 1 tablet (6.25 mg total) by mouth 2 (two) times daily with a meal.  . donepezil (ARICEPT) 10  MG tablet Take 10 mg by mouth at bedtime.   . feeding supplement (BOOST / RESOURCE BREEZE) LIQD Take 1 Container by mouth at bedtime. Prefers Vanilla flavored  . hydrocortisone (ANUSOL-HC) 2.5 % rectal cream Place 1 application rectally 2 (two) times daily as needed for hemorrhoids.  Marland Kitchen levothyroxine (SYNTHROID, LEVOTHROID) 75 MCG tablet Take 75 mcg by mouth daily.  Marland Kitchen lisinopril (PRINIVIL,ZESTRIL) 10 MG tablet Take 1 tablet (10 mg total) by mouth daily.  . meloxicam (MOBIC) 7.5 MG tablet Take 7.5 mg by  mouth as needed.   . memantine (NAMENDA) 5 MG tablet Take 1 tablet (5 mg total) by mouth daily.  . QUEtiapine (SEROQUEL) 25 MG tablet Take 12.5 mg by mouth daily.   . QUEtiapine (SEROQUEL) 25 MG tablet Take 25 mg by mouth at bedtime.   No facility-administered encounter medications on file as of 06/08/2018.    ROS was provided with assistance of staff Review of Systems  Constitutional: Negative for activity change, appetite change, chills, diaphoresis, fatigue and fever.  HENT: Positive for hearing loss. Negative for congestion and voice change.   Respiratory: Negative for cough, shortness of breath and wheezing.   Cardiovascular: Positive for leg swelling. Negative for chest pain and palpitations.  Gastrointestinal: Negative for abdominal distention, abdominal pain, constipation, diarrhea and nausea.  Genitourinary: Negative for difficulty urinating, dysuria and urgency.  Musculoskeletal: Positive for arthralgias and gait problem.  Skin: Negative for color change and pallor.  Neurological: Negative for dizziness, speech difficulty, weakness and headaches.       Dementia  Psychiatric/Behavioral: Positive for confusion. Negative for agitation, behavioral problems, hallucinations and sleep disturbance. The patient is not nervous/anxious.     Immunization History  Administered Date(s) Administered  . Influenza-Unspecified 07/19/2017  . PPD Test 09/05/2016   Pertinent  Health Maintenance Due  Topic Date Due  . DEXA SCAN  04/28/1992  . PNA vac Low Risk Adult (1 of 2 - PCV13) 04/28/1992  . INFLUENZA VACCINE  04/28/2018   Fall Risk  10/14/2017 01/28/2016  Falls in the past year? Yes No  Number falls in past yr: 1 -  Injury with Fall? Yes -   Functional Status Survey:    Vitals:   06/08/18 0924  BP: 132/74  Pulse: 74  Resp: 18  Temp: 98.3 F (36.8 C)  SpO2: 98%  Weight: 123 lb 9.6 oz (56.1 kg)  Height: 5\' 1"  (1.549 m)   Body mass index is 23.35 kg/m. Physical Exam    Constitutional: She appears well-developed and well-nourished.  HENT:  Head: Normocephalic and atraumatic.  Eyes: Pupils are equal, round, and reactive to light. EOM are normal.  Neck: Normal range of motion. Neck supple. No JVD present. No thyromegaly present.  Cardiovascular: Normal rate and regular rhythm.  No murmur heard. Pulmonary/Chest: Effort normal. She has no wheezes. She has no rales.  Abdominal: Soft. Bowel sounds are normal. She exhibits no distension. There is no tenderness. There is no rebound and no guarding.  Musculoskeletal: She exhibits edema. She exhibits no tenderness.  Trace edema BLE. Self transfer. Ambulates with walker.   Neurological: She is alert. No cranial nerve deficit. She exhibits normal muscle tone. Coordination normal.  Oriented to self and her room on unit.   Skin: Skin is warm and dry.  Psychiatric: She has a normal mood and affect. Her behavior is normal.    Labs reviewed: Recent Labs    06/29/17 12/23/17 05/12/18  NA 137 136* 140  K 4.7 4.6 4.0  CL  --  105 105  CO2  --  25 29  BUN 33* 45* 28*  CREATININE 1.1 1.0 1.0  CALCIUM  --  9.2 9.3   Recent Labs    06/29/17 12/23/17 05/12/18  AST 24 26 15   ALT 29 26 8   ALKPHOS 82 95 73  PROT  --  5.9 5.3  ALBUMIN  --  3.8 3.7   Recent Labs    11/25/17 12/23/17 05/12/18  WBC 6.1 7.7 6.7  HGB 11.2* 9.9* 10.3*  HCT 33* 29* 32*  PLT 234 294 210   Lab Results  Component Value Date   TSH 3.28 04/12/2018   Lab Results  Component Value Date   HGBA1C 5.7 09/30/2017   Lab Results  Component Value Date   CHOL 135 12/23/2017   HDL 46 12/23/2017   LDLCALC 74 12/23/2017   TRIG 71 12/23/2017    Significant Diagnostic Results in last 30 days:  No results found.  Assessment/Plan PVD (peripheral vascular disease) (HCC) swelling in BLE, stable, minimal.  Alzheimer's dementia with behavioral disturbance She resides in Memory care unit [facility] for safety and ADL assistance. She transfers self,  ambulates with walker, continue Donepezil 10mg  qd, Memantine 5mg  qd, Quetiapine 25mg  qd, 12.5mg  qd for memory and psychosis.  HTN (hypertension) Hx of HTN, blood pressure is controlled, continue  Lisinopril 10mg  qd, Carvedilol 6.35mg  bid. Continue Atorvastatin 10mg  for cardiovascular risk reduction.  Hypothyroidism Continue Levothyroxine 83mcg qd for hypothyroidism, last TSH 3.28 04/12/18      Family/ staff Communication: plan of care reviewed with the patient and charge nurse.   Labs/tests ordered:  none  Time spend 25 minutes

## 2018-06-08 NOTE — Assessment & Plan Note (Signed)
She resides in Memory care unit [facility] for safety and ADL assistance. She transfers self, ambulates with walker, continue Donepezil 10mg  qd, Memantine 5mg  qd, Quetiapine 25mg  qd, 12.5mg  qd for memory and psychosis.

## 2018-06-08 NOTE — Assessment & Plan Note (Signed)
Continue Levothyroxine 84mcg qd for hypothyroidism, last TSH 3.28 04/12/18

## 2018-06-08 NOTE — Assessment & Plan Note (Signed)
Hx of HTN, blood pressure is controlled, continue  Lisinopril 10mg  qd, Carvedilol 6.35mg  bid. Continue Atorvastatin 10mg  for cardiovascular risk reduction.

## 2018-06-15 ENCOUNTER — Ambulatory Visit (INDEPENDENT_AMBULATORY_CARE_PROVIDER_SITE_OTHER): Payer: Medicare Other | Admitting: Adult Health

## 2018-06-15 ENCOUNTER — Encounter: Payer: Self-pay | Admitting: Adult Health

## 2018-06-15 VITALS — BP 130/66 | HR 76 | Ht 66.5 in | Wt 125.8 lb

## 2018-06-15 DIAGNOSIS — R413 Other amnesia: Secondary | ICD-10-CM

## 2018-06-15 MED ORDER — MEMANTINE HCL 5 MG PO TABS: 5.0000 mg | ORAL_TABLET | Freq: Two times a day (BID) | ORAL | 5 refills | Status: DC

## 2018-06-15 NOTE — Progress Notes (Signed)
I have read the note, and I agree with the clinical assessment and plan.  Charles K Willis   

## 2018-06-15 NOTE — Progress Notes (Signed)
PATIENT: Jamie Thompson DOB: November 24, 1924  REASON FOR VISIT: follow up HISTORY FROM: patient  HISTORY OF PRESENT ILLNESS: Today 06/15/18:  Jamie Thompson is a 84 year old female with a history of memory disturbance.  He returns today for follow-up.  She continues to live at friend's home.  She is here today with her son-in-law.  She reports that she is able to complete all ADLs independently.  She eats her meals at the facility.  Denies any trouble sleeping.  Her son-in-law reports that she occasionally will have delusions and will tell the staff there some  Members of her family are dead and they are not.  Patient reports that she sleeps well.  She is on Seroquel.  She remains on Aricept 10 mg daily and Namenda 5 mg daily.  She returns today for evaluation.  HISTORY She reports doing okay. Her Son-in-law reports recent increase in memory dysfunction, confusion, agitation, low frustration tolerance and even some aggressive behavior in that she had slammed doors. The patient does seem a little bit agitated today. She was recently started on low-dose sertraline 25 mg once daily. She continues to take donepezil 10 mg daily. No recent falls, fell in Feb 2018 and sustained left-sided rib fractures unfortunately.   REVIEW OF SYSTEMS: Out of a complete 14 system review of symptoms, the patient complains only of the following symptoms, and all other reviewed systems are negative.  See HPI  ALLERGIES: Allergies  Allergen Reactions  . Amitriptyline     Urinary Retention  . Amlodipine Swelling  . Erythromycin Hives  . Nitrofurantoin Nausea Only  . Sudafed [Pseudoephedrine Hcl]     Unknown reaction per MAR   . Sulfa Antibiotics     "really ill"  . Tavist [Clemastine]     Urinary hesitancy  . Trimethoprim     Cramps  . Verapamil     Constipation    HOME MEDICATIONS: Outpatient Medications Prior to Visit  Medication Sig Dispense Refill  . acetaminophen (TYLENOL) 325 MG tablet Take 650 mg by  mouth every 6 (six) hours as needed.    Marland Kitchen atorvastatin (LIPITOR) 10 MG tablet Take 1 tablet (10 mg total) by mouth daily. 90 tablet 0  . Calcium-Phosphorus-Vitamin D (CITRACAL CALCIUM GUMMIES PO) Take 1 each by mouth 2 (two) times daily.     . carvedilol (COREG) 6.25 MG tablet Take 1 tablet (6.25 mg total) by mouth 2 (two) times daily with a meal. 62 tablet 3  . donepezil (ARICEPT) 10 MG tablet Take 10 mg by mouth at bedtime.     . feeding supplement (BOOST / RESOURCE BREEZE) LIQD Take 1 Container by mouth at bedtime. Prefers Vanilla flavored    . hydrocortisone (ANUSOL-HC) 2.5 % rectal cream Place 1 application rectally 2 (two) times daily as needed for hemorrhoids.    Marland Kitchen levothyroxine (SYNTHROID, LEVOTHROID) 75 MCG tablet Take 75 mcg by mouth daily.    Marland Kitchen lisinopril (PRINIVIL,ZESTRIL) 10 MG tablet Take 1 tablet (10 mg total) by mouth daily. 90 tablet 1  . memantine (NAMENDA) 5 MG tablet Take 1 tablet (5 mg total) by mouth daily. 30 tablet 5  . QUEtiapine (SEROQUEL) 25 MG tablet Take 12.5 mg by mouth daily.     . QUEtiapine (SEROQUEL) 25 MG tablet Take 25 mg by mouth at bedtime.    . meloxicam (MOBIC) 7.5 MG tablet Take 7.5 mg by mouth as needed.      No facility-administered medications prior to visit.     PAST  MEDICAL HISTORY: Past Medical History:  Diagnosis Date  . Adenomatous colon polyp 07/17/97  . Allergic rhinitis   . Allergy   . Anxiety disorder   . Atrial fibrillation ([place])   . Chronic anticoagulation   . Diverticulosis   . History of shingles   . Hypercholesteremia   . Hypertension   . Hypothyroidism   . Internal hemorrhoids   . Melanoma ([place])   . Memory loss   . Meniere disease   . Tension headache     PAST SURGICAL HISTORY: Past Surgical History:  Procedure Laterality Date  . CATARACT EXTRACTION Right 2012    FAMILY HISTORY: Family History  Problem Relation Age of Onset  . Stroke Father   . Clotting disorder Father   . Colon cancer Mother   . Colon cancer  Sister   . Clotting disorder Sister     SOCIAL HISTORY: Social History   Socioeconomic History  . Marital status: Single    Spouse name: Not on file  . Number of children: 2  . Years of education: 64yr colg  . Highest education level: Not on file  Occupational History  . Occupation: Retired Lexicographer  . Financial resource strain: Not hard at all  . Food insecurity:    Worry: Never true    Inability: Never true  . Transportation needs:    Medical: No    Non-medical: No  Tobacco Use  . Smoking status: Never Smoker  . Smokeless tobacco: Never Used  Substance and Sexual Activity  . Alcohol use: No    Alcohol/week: 0.0 standard drinks  . Drug use: No  . Sexual activity: Never  Lifestyle  . Physical activity:    Days per week: 0 days    Minutes per session: 0 min  . Stress: Only a little  Relationships  . Social connections:    Talks on phone: Not on file    Gets together: Not on file    Attends religious service: Not on file    Active member of club or organization: Not on file    Attends meetings of clubs or organizations: Not on file    Relationship status: Not on file  . Intimate partner violence:    Fear of current or ex partner: No    Emotionally abused: No    Physically abused: No    Forced sexual activity: No  Other Topics Concern  . Not on file  Social History Narrative   1 cup of coffee a day, occasionally drinks tea or soda   Social History     Marital status: Widowed             Spouse name:                        Years of education:        Number of children: 2               Occupational History   Occupation : Surveyor, mining            Comment                Retired                                       Social History Main Topics  Smoking status: Never Smoker                                                                  Smokeless tobacco: Never Used                         Alcohol use: No               Drug use: No                Sexual activity: Not on file            Other Topics            Concern     None on file      Social History Narrative     1 cup of coffee a day, occasionally drinks tea or soda.   She does not exercise, has a living will, DNR and POA   Lives at [facility]:   06/15/18 1316  BP: 130/66  Pulse: 76  Weight: 125 lb 12.8 oz (57.1 kg)  Height: 5' 6.5" (1.689 m)   Body mass index is 20 kg/m.   MMSE - Mini Mental State Exam 06/15/2018 12/08/2016 12/08/2016  Orientation to time 4 2 2   Orientation to Place 4 4 4   Registration 3 3 3   Attention/ Calculation 1 5 5   Attention/Calculation-comments - - -  Recall 0 0 0  Language- name 2 objects 2 2 2   Language- repeat 1 1 1   Language- follow 3 step command 2 2 2   Language- read & follow direction 1 1 1   Write a sentence 1 1 1   Copy design 0 1 1  Total score 19 22 22      Generalized: Well developed, in no acute distress   Neurological examination  Mentation: Alert oriented to time, place, history taking. Follows all commands speech and language fluent Cranial nerve II-XII: Pupils were equal round reactive to light. Extraocular movements were full, visual field were full on confrontational test. Facial sensation and strength were normal. Uvula tongue midline. Head turning and shoulder shrug  were normal and symmetric. Motor: The motor testing reveals 5 over 5 strength of all 4 extremities. Good symmetric motor tone is noted throughout.  Sensory: Sensory testing is intact to soft touch on all 4 extremities. No evidence of extinction is noted.  Coordination: Cerebellar testing reveals good finger-nose-finger and heel-to-shin bilaterally.  Gait and station: Gait is normal.  Reflexes: Deep tendon reflexes are symmetric and normal bilaterally.   DIAGNOSTIC DATA (LABS, IMAGING, TESTING) - I reviewed patient records, labs, notes, testing and imaging myself where  available.  Lab Results  Component Value Date   WBC 6.7 05/12/2018   HGB 10.3 (A) 05/12/2018   HCT 32 (A) 05/12/2018   MCV 91.2 11/17/2016   PLT 210 05/12/2018      Component Value Date/Time   NA 140 05/12/2018   K 4.0 05/12/2018   CL 105 05/12/2018   CO2 29 05/12/2018   GLUCOSE 108 (H) 11/17/2016 0550   BUN 28 (A) 05/12/2018   CREATININE 1.0 05/12/2018   CREATININE 0.80 11/17/2016  0550   CALCIUM 9.3 05/12/2018   PROT 5.3 05/12/2018   PROT 5.9 12/23/2017   ALBUMIN 3.7 05/12/2018   AST 15 05/12/2018   ALT 8 05/12/2018   ALKPHOS 73 05/12/2018   BILITOT 0.9 11/15/2016 1335   GFRNONAA 50 05/12/2018   GFRAA >60 11/17/2016 0550   Lab Results  Component Value Date   CHOL 135 12/23/2017   HDL 46 12/23/2017   LDLCALC 74 12/23/2017   TRIG 71 12/23/2017   Lab Results  Component Value Date   HGBA1C 5.7 09/30/2017   No results found for: VITAMINB12 Lab Results  Component Value Date   TSH 3.28 04/12/2018      ASSESSMENT AND PLAN 84 y.o. year old female  has a past medical history of Adenomatous colon polyp (07/17/97), Allergic rhinitis, Allergy, Anxiety disorder, Atrial fibrillation (HCC), Chronic anticoagulation, Diverticulosis, History of shingles, Hypercholesteremia, Hypertension, Hypothyroidism, Internal hemorrhoids, Melanoma ([place]), Memory loss, Meniere disease, and Tension headache. here with :  1.  Memory disturbance  The patient's memory score has declined slightly.  She will continue on Aricept 10 mg daily.  I will increase Namenda to 5 mg twice a day.  She is advised that if she has any new worsening symptoms they should let us know.  She will follow-up in 6 months or sooner if needed.   I spent 15 minutes with the patient. 50% of this time was spent reviewing her memory score   Ward Givens, MSN, NP-C 06/15/2018, 1:44 PM [facility] [address] [place], [place] [address] [phone]

## 2018-06-15 NOTE — Patient Instructions (Signed)
Your Plan:  Continue Aricept Increase Namenda 5 mg twice a day If your symptoms worsen or you develop new symptoms please let us know.    Thank you for coming to see [place] at [facility]. I hope we have been able to provide you high quality care today.  You may receive a patient satisfaction survey over the next few weeks. We would appreciate your feedback and comments so that we may continue to improve ourselves and the health of our patients.

## 2018-07-06 ENCOUNTER — Encounter: Payer: Self-pay | Admitting: Family Medicine

## 2018-07-06 ENCOUNTER — Non-Acute Institutional Stay (SKILLED_NURSING_FACILITY): Payer: Medicare Other | Admitting: Family Medicine

## 2018-07-06 DIAGNOSIS — E039 Hypothyroidism, unspecified: Secondary | ICD-10-CM | POA: Diagnosis not present

## 2018-07-06 DIAGNOSIS — E782 Mixed hyperlipidemia: Secondary | ICD-10-CM

## 2018-07-06 DIAGNOSIS — F329 Major depressive disorder, single episode, unspecified: Secondary | ICD-10-CM | POA: Diagnosis not present

## 2018-07-06 DIAGNOSIS — F0280 Dementia in other diseases classified elsewhere without behavioral disturbance: Secondary | ICD-10-CM

## 2018-07-06 DIAGNOSIS — I10 Essential (primary) hypertension: Secondary | ICD-10-CM | POA: Diagnosis not present

## 2018-07-06 DIAGNOSIS — I480 Paroxysmal atrial fibrillation: Secondary | ICD-10-CM

## 2018-07-06 DIAGNOSIS — G309 Alzheimer's disease, unspecified: Secondary | ICD-10-CM | POA: Diagnosis not present

## 2018-07-06 NOTE — Progress Notes (Signed)
Provider:  Alain Honey, MD Location:  [place] Room Number: 105 Place of Service:  SNF (31)  PCP: No primary care provider on file. Patient Care Team: Mast, Man X, NP as Nurse Practitioner (Internal Medicine) [name], MD as Attending Physician (Neurology)  Extended Emergency Contact Information Primary Emergency Contact: Key,Caroline Address: [place] Phone: [phone] Mobile Phone: [phone] Relation: Daughter Secondary Emergency Contact: Key,Tim  [place] Mobile Phone: [phone] Relation: Son  Code Status: DNR Goals of Care: Advanced Directive information Advanced Directives 07/06/2018  Does Patient Have a Medical Advance Directive? Yes  Type of Paramedic of [place];Living will;Out of facility DNR (pink MOST or yellow form)  Does patient want to make changes to medical advance directive? No - Patient declined  Copy of [place] in Chart? Yes  Would patient like information on creating a medical advance directive? -  Pre-existing out of facility DNR order (yellow form or pink MOST form) Yellow form placed in chart (order not valid for inpatient use)      Chief Complaint  Patient presents with  . Medical Management of Chronic Issues    F/u- alzheimers, hypothyroidism, HTN    HPI: Patient is a 84 y.o. female seen today for medical management of chronic problems including Alzheimer's dementia, hypertension, and hypothyroidism.  Patient was recently seen at the urology office and Namenda was increased from 5 mg daily to 5 mg twice daily.  She also takes Aricept.  She resides in memory care unit of facility but seems really high functioning for this environment.  She welcomes me under her room and ambulates in the room without walker.  Not all of her falls are appropriate but she does respond appropriately to simple  questions.  She denies any breathing difficulties.  She has good appetite.  She sleeps well.  She has had no recent falls.  Past Medical History:  Diagnosis Date  . Adenomatous colon polyp 07/17/97  . Allergic rhinitis   . Allergy   . Anxiety disorder   . Atrial fibrillation ([place])   . Chronic anticoagulation   . Diverticulosis   . History of shingles   . Hypercholesteremia   . Hypertension   . Hypothyroidism   . Internal hemorrhoids   . Melanoma ([place])   . Memory loss   . Meniere disease   . Tension headache    Past Surgical History:  Procedure Laterality Date  . CATARACT EXTRACTION Right 2012    reports that she has never smoked. She has never used smokeless tobacco. She reports that she does not drink alcohol or use drugs. Social History   Socioeconomic History  . Marital status: Single    Spouse name: Not on file  . Number of children: 2  . Years of education: 36yr colg  . Highest education level: Not on file  Occupational History  . Occupation: Retired Lexicographer  . Financial resource strain: Not hard at all  . Food insecurity:    Worry: Never true    Inability: Never true  . Transportation needs:    Medical: No    Non-medical: No  Tobacco Use  . Smoking status: Never Smoker  . Smokeless tobacco: Never Used  Substance and Sexual Activity  . Alcohol use: No    Alcohol/week: 0.0 standard drinks  . Drug use: No  . Sexual  activity: Never  Lifestyle  . Physical activity:    Days per week: 0 days    Minutes per session: 0 min  . Stress: Only a little  Relationships  . Social connections:    Talks on phone: Not on file    Gets together: Not on file    Attends religious service: Not on file    Active member of club or organization: Not on file    Attends meetings of clubs or organizations: Not on file    Relationship status: Not on file  . Intimate partner violence:    Fear of current or ex partner: No    Emotionally abused: No    Physically  abused: No    Forced sexual activity: No  Other Topics Concern  . Not on file  Social History Narrative   1 cup of coffee a day, occasionally drinks tea or soda   Social History     Marital status: Widowed             Spouse name:                        Years of education:        Number of children: 2               Occupational History   Occupation : Surveyor, mining            Comment                Retired                                       Social History Main Topics     Smoking status: Never Smoker                                                                  Smokeless tobacco: Never Used                         Alcohol use: No               Drug use: No               Sexual activity: Not on file            Other Topics            Concern     None on file      Social History Narrative     1 cup of coffee a day, occasionally drinks tea or soda.   She does not exercise, has a living will, DNR and POA   Lives at [facility] Status Survey:    Family History  Problem Relation Age of Onset  . Stroke Father   . Clotting disorder Father   . Colon cancer Mother   . Colon cancer Sister   . Clotting disorder Sister     Health Maintenance  Topic Date Due  . Samul Dada  04/28/1944  .  DEXA SCAN  04/28/1992  . PNA vac Low Risk Adult (1 of 2 - PCV13) 04/28/1992  . INFLUENZA VACCINE  04/28/2018    Allergies  Allergen Reactions  . Amitriptyline     Urinary Retention  . Amlodipine Swelling  . Erythromycin Hives  . Nitrofurantoin Nausea Only  . Sudafed [Pseudoephedrine Hcl]     Unknown reaction per MAR   . Sulfa Antibiotics     "really ill"  . Tavist [Clemastine]     Urinary hesitancy  . Trimethoprim     Cramps  . Verapamil     Constipation    Outpatient Encounter Medications as of 07/06/2018  Medication Sig  . acetaminophen (TYLENOL) 325 MG tablet Take 650 mg by mouth every 6 (six) hours as needed.  Marland Kitchen  atorvastatin (LIPITOR) 10 MG tablet Take 1 tablet (10 mg total) by mouth daily.  . Calcium-Phosphorus-Vitamin D (CITRACAL CALCIUM GUMMIES PO) Take 1 each by mouth 2 (two) times daily.   . carvedilol (COREG) 6.25 MG tablet Take 1 tablet (6.25 mg total) by mouth 2 (two) times daily with a meal.  . feeding supplement (BOOST / RESOURCE BREEZE) LIQD Take 1 Container by mouth at bedtime. Prefers Vanilla flavored  . hydrocortisone (ANUSOL-HC) 2.5 % rectal cream Place 1 application rectally 2 (two) times daily as needed for hemorrhoids.  Marland Kitchen levothyroxine (SYNTHROID, LEVOTHROID) 75 MCG tablet Take 75 mcg by mouth daily.  Marland Kitchen lisinopril (PRINIVIL,ZESTRIL) 10 MG tablet Take 1 tablet (10 mg total) by mouth daily.  . memantine (NAMENDA) 5 MG tablet Take 1 tablet (5 mg total) by mouth 2 (two) times daily.  . QUEtiapine (SEROQUEL) 25 MG tablet Take 12.5 mg by mouth 2 (two) times daily.   . [DISCONTINUED] donepezil (ARICEPT) 10 MG tablet Take 10 mg by mouth at bedtime.   . [DISCONTINUED] meloxicam (MOBIC) 7.5 MG tablet Take 7.5 mg by mouth as needed.    No facility-administered encounter medications on file as of 07/06/2018.     Review of Systems  Constitutional: Negative.   HENT: Negative.   Eyes: Negative.   Respiratory: Negative.   Cardiovascular: Negative.   Endocrine: Negative.   Genitourinary: Negative.   Allergic/Immunologic: Negative.   Neurological: Negative.   Hematological: Negative.   Psychiatric/Behavioral: Positive for confusion.    Vitals:   07/06/18 1153  BP: (!) 160/74  Pulse: 72  Resp: 18  Temp: (!) 97.4 F (36.3 C)  SpO2: 98%  Weight: 125 lb 12.8 oz (57.1 kg)  Height: 5\' 1"  (1.549 m)   Body mass index is 23.77 kg/m. Physical Exam  Constitutional: She appears well-developed and well-nourished.  HENT:  Head: Normocephalic.  Mouth/Throat: Oropharynx is clear and moist.  Eyes: Pupils are equal, round, and reactive to light. EOM are normal.  Neck: Normal range of motion.    Cardiovascular: Normal rate and regular rhythm.  Pulmonary/Chest: Effort normal and breath sounds normal.  Musculoskeletal: Normal range of motion.  Neurological: She is alert.  Oriented x2  Skin: Skin is warm and dry.  Psychiatric: She has a normal mood and affect. Her behavior is normal.  Nursing note and vitals reviewed.   Labs reviewed: Basic Metabolic Panel: Recent Labs    12/23/17 05/12/18  NA 136* 140  K 4.6 4.0  CL 105 105  CO2 25 29  BUN 45* 28*  CREATININE 1.0 1.0  CALCIUM 9.2 9.3   Liver Function Tests: Recent Labs    12/23/17 05/12/18  AST 26 15  ALT 26 8  ALKPHOS  95 73  PROT 5.9 5.3  ALBUMIN 3.8 3.7   No results for input(s): LIPASE, AMYLASE in the last 8760 hours. No results for input(s): AMMONIA in the last 8760 hours. CBC: Recent Labs    11/25/17 12/23/17 05/12/18  WBC 6.1 7.7 6.7  HGB 11.2* 9.9* 10.3*  HCT 33* 29* 32*  PLT 234 294 210   Cardiac Enzymes: No results for input(s): CKTOTAL, CKMB, CKMBINDEX, TROPONINI in the last 8760 hours. BNP: Invalid input(s): POCBNP Lab Results  Component Value Date   HGBA1C 5.7 09/30/2017   Lab Results  Component Value Date   TSH 3.28 04/12/2018   No results found for: VITAMINB12 No results found for: FOLATE No results found for: IRON, TIBC, FERRITIN  Imaging and Procedures obtained prior to SNF admission: Ct Pelvis Wo Contrast  Result Date: 12/16/2017 CLINICAL DATA:  Fall at nursing home.  Thigh pain. EXAM: CT PELVIS WITHOUT CONTRAST CT OF THE LEFT HIP WITHOUT CONTRAST TECHNIQUE: Multidetector CT imaging of the left hip was performed according to the standard protocol. Multiplanar CT image reconstructions were also generated. COMPARISON:  None. FINDINGS: LEFT HIP: Bones/Joint/Cartilage Minimally displaced fracture of the right superior pubic ramus approaching the pubic body. Minimally displaced fracture mid inferior pubic ramus. The included acetabulum and proximal femur are intact. No joint effusion.  Mild osteoarthritis with osteophytes and subchondral cystic change. Ligaments Suboptimally assessed by CT. Muscles and Tendons No intramuscular hematoma. Soft tissues Mild soft tissue edema laterally. PELVIS: Urinary tract: Urinary bladder is distended without wall thickening. Bowel: No acute finding.  Diverticulosis without diverticulitis. Vascular/lymphatic: Advanced aortic and iliac atherosclerosis. No adenopathy. Reproductive: Normal atrophic uterus for age.  No adnexal mass. Other: No pelvic free fluid. Musculoskeletal: Bones are diffusely under mineralized. No displaced sacral fracture. Sacroiliac joints are congruent. Left hip described on dedicated field of view imaging. IMPRESSION: Left hip: Minimally displaced fractures of superior and inferior pubic rami. Pelvis: No additional fracture of the bony pelvis. No evidence of sacral fracture, however diffuse bony under mineralization limits assessment. Electronically Signed   By: Jeb Levering M.D.   On: 12/16/2017 04:08   Ct Hip Left Wo Contrast  Result Date: 12/16/2017 CLINICAL DATA:  Fall at nursing home.  Thigh pain. EXAM: CT PELVIS WITHOUT CONTRAST CT OF THE LEFT HIP WITHOUT CONTRAST TECHNIQUE: Multidetector CT imaging of the left hip was performed according to the standard protocol. Multiplanar CT image reconstructions were also generated. COMPARISON:  None. FINDINGS: LEFT HIP: Bones/Joint/Cartilage Minimally displaced fracture of the right superior pubic ramus approaching the pubic body. Minimally displaced fracture mid inferior pubic ramus. The included acetabulum and proximal femur are intact. No joint effusion. Mild osteoarthritis with osteophytes and subchondral cystic change. Ligaments Suboptimally assessed by CT. Muscles and Tendons No intramuscular hematoma. Soft tissues Mild soft tissue edema laterally. PELVIS: Urinary tract: Urinary bladder is distended without wall thickening. Bowel: No acute finding.  Diverticulosis without  diverticulitis. Vascular/lymphatic: Advanced aortic and iliac atherosclerosis. No adenopathy. Reproductive: Normal atrophic uterus for age.  No adnexal mass. Other: No pelvic free fluid. Musculoskeletal: Bones are diffusely under mineralized. No displaced sacral fracture. Sacroiliac joints are congruent. Left hip described on dedicated field of view imaging. IMPRESSION: Left hip: Minimally displaced fractures of superior and inferior pubic rami. Pelvis: No additional fracture of the bony pelvis. No evidence of sacral fracture, however diffuse bony under mineralization limits assessment. Electronically Signed   By: Jeb Levering M.D.   On: 12/16/2017 04:08   Dg Hip Unilat With Pelvis 2-3  Views Left  Result Date: 12/16/2017 CLINICAL DATA:  Unwitnessed fall at nursing home this evening. Left hip and leg pain. EXAM: DG HIP (WITH OR WITHOUT PELVIS) 2-3V LEFT COMPARISON:  None. FINDINGS: Technically limited due to difficulty with positioning. No visualized fracture. Femoral head is seated. Pubic rami are grossly intact but not well assessed. Patient is rotated. IMPRESSION: Technically limited evaluation due to positioning without evidence of acute fracture. If there is persistent clinical concern for fracture, recommend further evaluation with MRI or CT. Electronically Signed   By: Jeb Levering M.D.   On: 12/16/2017 00:48   Dg Femur 1v Left  Result Date: 12/16/2017 CLINICAL DATA:  Unwitnessed fall at nursing home this evening. Left hip and leg pain. EXAM: LEFT FEMUR 1 VIEW COMPARISON:  None.  Concurrent hyper radiograph. FINDINGS: Technically limited exam due to positioning. No evidence of femur fracture. Knee alignment is not well assessed. Proximal femur included on concurrent hip radiographs. There are vascular calcifications. IMPRESSION: No evidence of femur fracture on limited exam due to difficulty with positioning. Electronically Signed   By: Jeb Levering M.D.   On: 12/16/2017 00:47     Assessment/Plan 1. Paroxysmal atrial fibrillation (HCC) And rhythm are normal today.  She is on no medicines which control rate or rhythm at this time.  2. Essential hypertension Blood pressure is well controlled on amlodipine, low-dose 2.5 mg/day.  She also takes 25 mg of Maxide, 1/2 tablet in the morning.  3. Acquired hypothyroidism Most recent TSH 3 months ago and is in therapeutic range at 3.28.  She takes levothyroxine 75 mcg daily.  4. 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  She does take Namenda as well as Aricept.  I do not think Aricept is adding to her care but I am willing to continue Namenda because I think it does affect behaviors which can be commonly seen with dementia  5. Mixed hyperlipidemia She continues to take low-dose atorvastatin 10 mg.  There are no side effects such as myalgias.  Most recent LDL was 6 months ago at 85 with a total cholesterol of 135.   Family/ staff Communication:   Labs/tests ordered:  Lillette Boxer. Sabra Heck, [place] [address] [place], [place] Office [phone]

## 2018-07-14 DIAGNOSIS — H8103 Meniere's disease, bilateral: Secondary | ICD-10-CM | POA: Diagnosis not present

## 2018-07-14 DIAGNOSIS — H6123 Impacted cerumen, bilateral: Secondary | ICD-10-CM | POA: Diagnosis not present

## 2018-07-14 DIAGNOSIS — F0281 Dementia in other diseases classified elsewhere with behavioral disturbance: Secondary | ICD-10-CM | POA: Diagnosis not present

## 2018-07-14 DIAGNOSIS — H903 Sensorineural hearing loss, bilateral: Secondary | ICD-10-CM | POA: Diagnosis not present

## 2018-08-05 ENCOUNTER — Encounter: Payer: Self-pay | Admitting: Nurse Practitioner

## 2018-08-05 ENCOUNTER — Non-Acute Institutional Stay (SKILLED_NURSING_FACILITY): Payer: Medicare Other | Admitting: Nurse Practitioner

## 2018-08-05 DIAGNOSIS — E039 Hypothyroidism, unspecified: Secondary | ICD-10-CM

## 2018-08-05 DIAGNOSIS — I480 Paroxysmal atrial fibrillation: Secondary | ICD-10-CM

## 2018-08-05 DIAGNOSIS — F0283 Dementia in other diseases classified elsewhere, unspecified severity, with mood disturbance: Secondary | ICD-10-CM

## 2018-08-05 DIAGNOSIS — G309 Alzheimer's disease, unspecified: Secondary | ICD-10-CM

## 2018-08-05 DIAGNOSIS — I10 Essential (primary) hypertension: Secondary | ICD-10-CM

## 2018-08-05 DIAGNOSIS — F0280 Dementia in other diseases classified elsewhere without behavioral disturbance: Secondary | ICD-10-CM

## 2018-08-05 DIAGNOSIS — F329 Major depressive disorder, single episode, unspecified: Secondary | ICD-10-CM

## 2018-08-05 NOTE — Assessment & Plan Note (Signed)
Blood pressure is controlled, continue Lisinopril 10mg  qd, Carvedilol 6.25mg  bid.

## 2018-08-05 NOTE — Progress Notes (Signed)
Location:  [facility] Room Number: 105 Place of Service:  SNF (31) Provider:  Geddy [place], ManXie NP  Wardell Honour, MD  Patient Care Team: Wardell Honour, MD as PCP - General (Family Medicine) Laysha Childers X, NP as Nurse Practitioner (Internal Medicine) [name], MD as Attending Physician (Neurology)  Extended Emergency Contact Information Primary Emergency Contact: Key,Caroline Address: [place] Phone: [phone] Mobile Phone: [phone] Relation: Daughter Secondary Emergency Contact: Key,Tim  [place] Mobile Phone: [phone] Relation: Son  Code Status:  DNR Goals of care: Advanced Directive information Advanced Directives 08/05/2018  Does Patient Have a Medical Advance Directive? Yes  Type of Paramedic of [place];Living will;Out of facility DNR (pink MOST or yellow form)  Does patient want to make changes to medical advance directive? No - Patient declined  Copy of [place] in Chart? Yes - validated most recent copy scanned in chart (See row information)  Would patient like information on creating a medical advance directive? -  Pre-existing out of facility DNR order (yellow form or pink MOST form) Yellow form placed in chart (order not valid for inpatient use)     Chief Complaint  Patient presents with  . Medical Management of Chronic Issues    F/u- Afib, hypothyroidism, HTN, dementia, alzheimers, PVD    HPI:  Pt is a 84 y.o. female seen today for medical management of chronic diseases.    The patient has history of dementia, resides in memory care unit [facility], on Memantine 5mg  bid for memory, Quetiapine 12.5mg  bid for behavioral management. Hx of HTN, blood pressure is controlled on Lisinopril 10mg  qd. Carvedilol 6.25mg  bid. Hx of hypothyroidism, on Levothyroxine 66mcg qd, last TSH 3.28 04/12/18.   Past Medical  History:  Diagnosis Date  . Adenomatous colon polyp 07/17/97  . Allergic rhinitis   . Allergy   . Anxiety disorder   . Atrial fibrillation ([place])   . Chronic anticoagulation   . Diverticulosis   . History of shingles   . Hypercholesteremia   . Hypertension   . Hypothyroidism   . Internal hemorrhoids   . Melanoma ([place])   . Memory loss   . Meniere disease   . Tension headache    Past Surgical History:  Procedure Laterality Date  . CATARACT EXTRACTION Right 2012    Allergies  Allergen Reactions  . Amitriptyline     Urinary Retention  . Amlodipine Swelling  . Erythromycin Hives  . Nitrofurantoin Nausea Only  . Sudafed [Pseudoephedrine Hcl]     Unknown reaction per MAR   . Sulfa Antibiotics     "really ill"  . Tavist [Clemastine]     Urinary hesitancy  . Trimethoprim     Cramps  . Verapamil     Constipation    Outpatient Encounter Medications as of 08/05/2018  Medication Sig  . acetaminophen (TYLENOL) 325 MG tablet Take 650 mg by mouth every 6 (six) hours as needed.  Marland Kitchen atorvastatin (LIPITOR) 10 MG tablet Take 1 tablet (10 mg total) by mouth daily.  . Calcium-Phosphorus-Vitamin D (CITRACAL CALCIUM GUMMIES PO) Take 1 each by mouth 2 (two) times daily.   . carvedilol (COREG) 6.25 MG tablet Take 1 tablet (6.25 mg total) by mouth 2 (two) times daily with a meal.  . feeding supplement (BOOST / RESOURCE BREEZE) LIQD Take 1 Container by mouth at bedtime. Prefers  Vanilla flavored  . hydrocortisone (ANUSOL-HC) 2.5 % rectal cream Place 1 application rectally 2 (two) times daily as needed for hemorrhoids.  Marland Kitchen levothyroxine (SYNTHROID, LEVOTHROID) 75 MCG tablet Take 75 mcg by mouth daily.  Marland Kitchen lisinopril (PRINIVIL,ZESTRIL) 10 MG tablet Take 1 tablet (10 mg total) by mouth daily.  . memantine (NAMENDA) 5 MG tablet Take 1 tablet (5 mg total) by mouth 2 (two) times daily.  . QUEtiapine (SEROQUEL) 25 MG tablet Take 12.5 mg by mouth 2 (two) times daily.    No facility-administered  encounter medications on file as of 08/05/2018.    ROS was provided with assistance of staff Review of Systems  Constitutional: Negative for activity change, appetite change, chills, diaphoresis, fatigue, fever and unexpected weight change.  HENT: Positive for hearing loss. Negative for congestion and voice change.   Respiratory: Negative for cough, shortness of breath and wheezing.   Cardiovascular: Negative for chest pain, palpitations and leg swelling.  Gastrointestinal: Negative for abdominal pain, constipation, diarrhea, nausea and vomiting.  Genitourinary: Negative for difficulty urinating, dysuria and urgency.  Musculoskeletal: Positive for arthralgias and gait problem.  Skin: Negative for color change and pallor.  Neurological: Negative for dizziness, speech difficulty, weakness and headaches.       Dementia  Psychiatric/Behavioral: Positive for confusion. Negative for agitation, behavioral problems, hallucinations and sleep disturbance. The patient is not nervous/anxious.     Immunization History  Administered Date(s) Administered  . Influenza Whole 06/30/2018  . Influenza-Unspecified 07/19/2017  . PPD Test 09/05/2016   Pertinent  Health Maintenance Due  Topic Date Due  . DEXA SCAN  04/28/1992  . PNA vac Low Risk Adult (1 of 2 - PCV13) 04/28/1992  . INFLUENZA VACCINE  Completed   Fall Risk  06/15/2018 10/14/2017 01/28/2016  Falls in the past year? Yes Yes No  Number falls in past yr: 1 1 -  Injury with Fall? Yes Yes -  Comment pelvic fracture - -  Risk for fall due to : Impaired balance/gait - -   Functional Status Survey:    Vitals:   08/05/18 1444  BP: 140/72  Pulse: 78  Resp: 20  Temp: 98.1 F (36.7 C)  SpO2: 98%  Weight: 125 lb 3.2 oz (56.8 kg)  Height: 5\' 1"  (1.549 m)   Body mass index is 23.66 kg/m. Physical Exam  Constitutional: She appears well-developed and well-nourished.  HENT:  Head: Normocephalic and atraumatic.  Eyes: Pupils are equal, round,  and reactive to light. EOM are normal.  Neck: Normal range of motion. Neck supple. No JVD present. No thyromegaly present.  Cardiovascular: Normal rate and regular rhythm.  No murmur heard. Pulmonary/Chest: Effort normal. She has no wheezes. She has no rales.  Abdominal: Soft. She exhibits no distension. There is no tenderness. There is no rebound and no guarding.  Musculoskeletal: She exhibits no edema.  Ambulates with walker.   Neurological: She is alert. No cranial nerve deficit. She exhibits normal muscle tone. Coordination normal.  Oriented to person and her room on unit.   Skin: Skin is warm and dry.  Psychiatric: She has a normal mood and affect. Her behavior is normal.    Labs reviewed: Recent Labs    12/23/17 05/12/18  NA 136* 140  K 4.6 4.0  CL 105 105  CO2 25 29  BUN 45* 28*  CREATININE 1.0 1.0  CALCIUM 9.2 9.3   Recent Labs    12/23/17 05/12/18  AST 26 15  ALT 26 8  ALKPHOS 95 73  PROT 5.9 5.3  ALBUMIN 3.8 3.7   Recent Labs    11/25/17 12/23/17 05/12/18  WBC 6.1 7.7 6.7  HGB 11.2* 9.9* 10.3*  HCT 33* 29* 32*  PLT 234 294 210   Lab Results  Component Value Date   TSH 3.28 04/12/2018   Lab Results  Component Value Date   HGBA1C 5.7 09/30/2017   Lab Results  Component Value Date   CHOL 135 12/23/2017   HDL 46 12/23/2017   LDLCALC 74 12/23/2017   TRIG 71 12/23/2017    Significant Diagnostic Results in last 30 days:  No results found.  Assessment/Plan HTN (hypertension) Blood pressure is controlled, continue Lisinopril 10mg  qd, Carvedilol 6.25mg  bid.   Atrial fibrillation (HCC) Heart rate is in control, continue Carvedilol.   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      Family/ staff Communication:  Plan of care reviewed with the patient and charge nurse.     Labs/tests ordered:  none  Time spend 25 minutes

## 2018-08-05 NOTE — Assessment & Plan Note (Signed)
Stable, continue Levothyroxine 21mcg qd, last TSH wnl 3.28 04/12/18

## 2018-08-05 NOTE — Assessment & Plan Note (Signed)
Heart rate is in control, continue Carvedilol.  

## 2018-08-05 NOTE — Assessment & Plan Note (Signed)
F/u neurology, continue Memantine 5mg  bid for memory, Quetiapine 12.5mg  bid for behaviors.

## 2018-08-08 ENCOUNTER — Encounter: Payer: Self-pay | Admitting: Nurse Practitioner

## 2018-09-02 ENCOUNTER — Encounter: Payer: Self-pay | Admitting: Nurse Practitioner

## 2018-09-02 ENCOUNTER — Non-Acute Institutional Stay (SKILLED_NURSING_FACILITY): Payer: Medicare Other | Admitting: Nurse Practitioner

## 2018-09-02 DIAGNOSIS — E039 Hypothyroidism, unspecified: Secondary | ICD-10-CM | POA: Diagnosis not present

## 2018-09-02 DIAGNOSIS — F329 Major depressive disorder, single episode, unspecified: Secondary | ICD-10-CM | POA: Diagnosis not present

## 2018-09-02 DIAGNOSIS — F0280 Dementia in other diseases classified elsewhere without behavioral disturbance: Secondary | ICD-10-CM | POA: Diagnosis not present

## 2018-09-02 DIAGNOSIS — I10 Essential (primary) hypertension: Secondary | ICD-10-CM

## 2018-09-02 DIAGNOSIS — G309 Alzheimer's disease, unspecified: Secondary | ICD-10-CM

## 2018-09-02 NOTE — Assessment & Plan Note (Signed)
Stable, last TSH wnl 3.25 04/10/18, continue Levothyroxine 19mcg po qd.

## 2018-09-02 NOTE — [place] Notes (Signed)
Location:  [place] Room Number: 105 Place of Service:  SNF (31) Provider:  Vivia Rosenburg, Manxie  NP  Wardell Honour, MD  Patient Care Team: Wardell Honour, MD as PCP - General (Family Medicine) Deema Juncaj X, NP as Nurse Practitioner (Internal Medicine) [name], MD as Attending Physician (Neurology)  Extended Emergency Contact Information Primary Emergency Contact: Key,Caroline Address: [place] Phone: [phone] Mobile Phone: [phone] Relation: Daughter Secondary Emergency Contact: Key,Tim  [place] Mobile Phone: [phone] Relation: Son  Code Status:  DNR Goals of care: Advanced Directive information Advanced Directives 09/02/2018  Does Patient Have a Medical Advance Directive? Yes  Type of Paramedic of [place];Living will;Out of facility DNR (pink MOST or yellow form)  Does patient want to make changes to medical advance directive? No - Patient declined  Copy of [place] in Chart? Yes - validated most recent copy scanned in chart (See row information)  Would patient like information on creating a medical advance directive? -  Pre-existing out of facility DNR order (yellow form or pink MOST form) Yellow form placed in chart (order not valid for inpatient use)     Chief Complaint  Patient presents with  . Medical Management of Chronic Issues    F/u- HTN, Afib, dementia, hypothyroidism, PVD,     HPI:  Pt is a 84 y.o. female seen today for medical management of chronic diseases.     The patient has history of dementia, resides in [facility], ambulates with walker, on Memantine 5mg  bid for memory. Her mood is stable on Seroquel 12.5mg  bid. Hx of HTN, blood pressure is controlled on Lisinopril 10mg  qd, Coreg 6.25mg  bid. Hypothyroidism, on Levothyroxine 63mcg qd, last TSH 3.25 04/12/18  Past Medical History:  Diagnosis  Date  . Adenomatous colon polyp 07/17/97  . Allergic rhinitis   . Allergy   . Anxiety disorder   . Atrial fibrillation ([place])   . Chronic anticoagulation   . Diverticulosis   . History of shingles   . Hypercholesteremia   . Hypertension   . Hypothyroidism   . Internal hemorrhoids   . Melanoma ([place])   . Memory loss   . Meniere disease   . Tension headache    Past Surgical History:  Procedure Laterality Date  . CATARACT EXTRACTION Right 2012    Allergies  Allergen Reactions  . Amitriptyline     Urinary Retention  . Amlodipine Swelling  . Erythromycin Hives  . Nitrofurantoin Nausea Only  . Sudafed [Pseudoephedrine Hcl]     Unknown reaction per MAR   . Sulfa Antibiotics     "really ill"  . Tavist [Clemastine]     Urinary hesitancy  . Trimethoprim     Cramps  . Verapamil     Constipation    Outpatient Encounter Medications as of 09/02/2018  Medication Sig  . acetaminophen (TYLENOL) 325 MG tablet Take 650 mg by mouth every 6 (six) hours as needed.  Marland Kitchen atorvastatin (LIPITOR) 10 MG tablet Take 1 tablet (10 mg total) by mouth daily.  . Calcium-Phosphorus-Vitamin D (CITRACAL CALCIUM GUMMIES PO) Take 1 each by mouth 2 (two) times daily.   . carvedilol (COREG) 6.25 MG tablet Take 1 tablet (6.25 mg total) by mouth 2 (two) times daily with a meal.  . feeding supplement (BOOST / RESOURCE BREEZE) LIQD Take 1 Container by mouth at  bedtime. Prefers Vanilla flavored  . hydrocortisone (ANUSOL-HC) 2.5 % rectal cream Place 1 application rectally 2 (two) times daily as needed for hemorrhoids.  Marland Kitchen levothyroxine (SYNTHROID, LEVOTHROID) 75 MCG tablet Take 75 mcg by mouth daily.  Marland Kitchen lisinopril (PRINIVIL,ZESTRIL) 10 MG tablet Take 1 tablet (10 mg total) by mouth daily.  . memantine (NAMENDA) 5 MG tablet Take 1 tablet (5 mg total) by mouth 2 (two) times daily.  . QUEtiapine (SEROQUEL) 25 MG tablet Take 12.5 mg by mouth 2 (two) times daily.    No facility-administered encounter medications on  file as of 09/02/2018.    ROS was provided with assistance of staff Review of Systems  Constitutional: Negative for activity change, appetite change, chills, diaphoresis, fatigue, fever and unexpected weight change.  HENT: Positive for hearing loss. Negative for congestion and voice change.   Respiratory: Negative for cough, shortness of breath and wheezing.   Cardiovascular: Positive for leg swelling. Negative for chest pain.  Gastrointestinal: Negative for abdominal distention, abdominal pain, constipation, diarrhea, nausea and vomiting.  Genitourinary: Negative for difficulty urinating, dysuria and urgency.  Musculoskeletal: Positive for arthralgias and gait problem.  Skin: Negative for color change and pallor.  Neurological: Negative for dizziness, speech difficulty, weakness and headaches.       Dementia  Psychiatric/Behavioral: Positive for confusion. Negative for agitation, behavioral problems, hallucinations and sleep disturbance. The patient is not nervous/anxious.     Immunization History  Administered Date(s) Administered  . Influenza Whole 06/30/2018  . Influenza-Unspecified 07/19/2017  . PPD Test 09/05/2016   Pertinent  Health Maintenance Due  Topic Date Due  . DEXA SCAN  04/28/1992  . PNA vac Low Risk Adult (1 of 2 - PCV13) 04/28/1992  . INFLUENZA VACCINE  Completed   Fall Risk  06/15/2018 10/14/2017 01/28/2016  Falls in the past year? Yes Yes No  Number falls in past yr: 1 1 -  Injury with Fall? Yes Yes -  Comment pelvic fracture - -  Risk for fall due to : Impaired balance/gait - -   Functional Status Survey:    Vitals:   09/02/18 1425  BP: (!) 160/82  Pulse: 82  Resp: 18  Temp: (!) 96.2 F (35.7 C)  SpO2: 98%  Weight: 127 lb 12.8 oz (58 kg)  Height: 5\' 1"  (1.549 m)   Body mass index is 24.15 kg/m. Physical Exam  Constitutional: She appears well-developed and well-nourished.  HENT:  Head: Normocephalic and atraumatic.  Eyes: Pupils are equal, round,  and reactive to light. EOM are normal.  Neck: Normal range of motion. Neck supple. No JVD present. No thyromegaly present.  Cardiovascular: Normal rate and regular rhythm.  No murmur heard. Pulmonary/Chest: Effort normal. She has no wheezes. She has no rales.  Abdominal: Soft. She exhibits no distension. There is no tenderness. There is no rebound and no guarding.  Musculoskeletal: She exhibits edema.  Ambulates with walker on unit. Edema BLE trace.   Neurological: She is alert. No cranial nerve deficit. She exhibits normal muscle tone. Coordination normal.  Oriented to self and room on unit.   Skin: Skin is warm and dry.  Psychiatric: She has a normal mood and affect. Her behavior is normal.    Labs reviewed: Recent Labs    12/23/17 05/12/18  NA 136* 140  K 4.6 4.0  CL 105 105  CO2 25 29  BUN 45* 28*  CREATININE 1.0 1.0  CALCIUM 9.2 9.3   Recent Labs    12/23/17 05/12/18  AST 26  15  ALT 26 8  ALKPHOS 95 73  PROT 5.9 5.3  ALBUMIN 3.8 3.7   Recent Labs    11/25/17 12/23/17 05/12/18  WBC 6.1 7.7 6.7  HGB 11.2* 9.9* 10.3*  HCT 33* 29* 32*  PLT 234 294 210   Lab Results  Component Value Date   TSH 3.28 04/12/2018   Lab Results  Component Value Date   HGBA1C 5.7 09/30/2017   Lab Results  Component Value Date   CHOL 135 12/23/2017   HDL 46 12/23/2017   LDLCALC 74 12/23/2017   TRIG 71 12/23/2017    Significant Diagnostic Results in last 30 days:  No results found.  Assessment/Plan HTN (hypertension) Blood pressure is controlled, continue Lisinopril 10mg  qd, Coreg 6.25mg  bid.   Hypothyroidism Stable, last TSH wnl 3.25 04/10/18, continue Levothyroxine 2mcg po qd.   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      Family/ staff Communication: plan of care reviewed with the patient and charge nurse.   Labs/tests ordered:   none  Time spend 25 minutes.

## 2018-09-02 NOTE — Assessment & Plan Note (Signed)
Continue memory care unit [facility] for safety and care assistance, continue Memantine 5mg  bid for memory, Seroquel 12.5mg  bid for mood.

## 2018-09-02 NOTE — Assessment & Plan Note (Signed)
Blood pressure is controlled, continue Lisinopril 10mg  qd, Coreg 6.25mg  bid.

## 2018-12-13 ENCOUNTER — Encounter: Payer: Self-pay | Admitting: Internal Medicine

## 2018-12-13 ENCOUNTER — Non-Acute Institutional Stay (SKILLED_NURSING_FACILITY): Payer: Medicare Other | Admitting: Internal Medicine

## 2018-12-13 DIAGNOSIS — F329 Major depressive disorder, single episode, unspecified: Secondary | ICD-10-CM

## 2018-12-13 DIAGNOSIS — G301 Alzheimer's disease with late onset: Secondary | ICD-10-CM | POA: Diagnosis not present

## 2018-12-13 DIAGNOSIS — F0281 Dementia in other diseases classified elsewhere with behavioral disturbance: Secondary | ICD-10-CM | POA: Diagnosis not present

## 2018-12-13 DIAGNOSIS — I480 Paroxysmal atrial fibrillation: Secondary | ICD-10-CM | POA: Diagnosis not present

## 2018-12-13 DIAGNOSIS — G309 Alzheimer's disease, unspecified: Secondary | ICD-10-CM

## 2018-12-13 DIAGNOSIS — F0280 Dementia in other diseases classified elsewhere without behavioral disturbance: Secondary | ICD-10-CM | POA: Diagnosis not present

## 2018-12-13 DIAGNOSIS — I10 Essential (primary) hypertension: Secondary | ICD-10-CM | POA: Diagnosis not present

## 2018-12-13 DIAGNOSIS — D649 Anemia, unspecified: Secondary | ICD-10-CM

## 2018-12-13 DIAGNOSIS — F02818 Dementia in other diseases classified elsewhere, unspecified severity, with other behavioral disturbance: Secondary | ICD-10-CM

## 2018-12-13 DIAGNOSIS — E039 Hypothyroidism, unspecified: Secondary | ICD-10-CM | POA: Diagnosis not present

## 2018-12-13 NOTE — [place] Notes (Signed)
Location:  [place] Room Number: 105 Place of Service:  SNF (31) Provider:Cyra Spader Rene Kocher MD  Virgie Dad, MD  Patient Care Team: Virgie Dad, MD as PCP - General (Internal Medicine) Mast, Man X, NP as Nurse Practitioner (Internal Medicine) [name], MD as Attending Physician (Neurology)  Extended Emergency Contact Information Primary Emergency Contact: Key,Caroline Address: [place] Phone: [phone] Mobile Phone: [phone] Relation: Daughter Secondary Emergency Contact: Key,Tim  [place] Mobile Phone: [phone] Relation: Son  Code Status: DNR Goals of care: Advanced Directive information Advanced Directives 12/13/2018  Does Patient Have a Medical Advance Directive? Yes  Type of Paramedic of [place];Living will;Out of facility DNR (pink MOST or yellow form)  Does patient want to make changes to medical advance directive? No - Patient declined  Copy of [place] in Chart? Yes - validated most recent copy scanned in chart (See row information)  Would patient like information on creating a medical advance directive? -  Pre-existing out of facility DNR order (yellow form or pink MOST form) Yellow form placed in chart (order not valid for inpatient use)     Chief Complaint  Patient presents with   Medical Management of Chronic Issues    Routine visit     HPI:  Pt is a 85 y.o. female seen today for medical management of chronic diseases.   Patient is Long term resident of facility. She is in  Memory Unit.  She has h/o Dementia with Behavior issues, Hypertension, Hypothyrodism, PAF not on Any anticoagulation, Hyperlipemia Patient was unable to give any history. She was very paranoid about me and did nto want me to examine her. She keeps saying that she is fine and does not have ot see doctor. She walks with  the walker Is mostly independent in her ADLS. She mostly stays in her Room. Per nurses she does have Behavior issues and they have to direct her. No recent Falls.Appetite seems good. Weight stable   Past Medical History:  Diagnosis Date   Adenomatous colon polyp 07/17/97   Allergic rhinitis    Allergy    Anxiety disorder    Atrial fibrillation (HCC)    Chronic anticoagulation    Diverticulosis    History of shingles    Hypercholesteremia    Hypertension    Hypothyroidism    Internal hemorrhoids    Melanoma ([place])    Memory loss    Meniere disease    Tension headache    Past Surgical History:  Procedure Laterality Date   CATARACT EXTRACTION Right 2012    Allergies  Allergen Reactions   Amitriptyline     Urinary Retention   Amlodipine Swelling   Erythromycin Hives   Nitrofurantoin Nausea Only   Sudafed [Pseudoephedrine Hcl]     Unknown reaction per MAR    Sulfa Antibiotics     "really ill"   Tavist [Clemastine]     Urinary hesitancy   Trimethoprim     Cramps   Verapamil     Constipation    Outpatient Encounter Medications as of 12/13/2018  Medication Sig   acetaminophen (TYLENOL) 325 MG tablet Take 650 mg by mouth every 6 (six) hours as needed.   atorvastatin (LIPITOR) 10 MG tablet Take 1 tablet (10 mg total) by mouth daily.   calcium carbonate (CALCIUM 600) 600 MG TABS tablet Take 600  mg by mouth 2 (two) times daily with a meal.   carvedilol (COREG) 6.25 MG tablet Take 1 tablet (6.25 mg total) by mouth 2 (two) times daily with a meal.   feeding supplement (BOOST / RESOURCE BREEZE) LIQD Take 1 Container by mouth at bedtime. Prefers Vanilla flavored   hydrocortisone (ANUSOL-HC) 2.5 % rectal cream Place 1 application rectally 2 (two) times daily as needed for hemorrhoids.   levothyroxine (SYNTHROID, LEVOTHROID) 75 MCG tablet Take 75 mcg by mouth daily.   lisinopril (PRINIVIL,ZESTRIL) 10 MG tablet Take 1 tablet (10 mg total) by mouth  daily.   memantine (NAMENDA) 5 MG tablet Take 1 tablet (5 mg total) by mouth 2 (two) times daily.   QUEtiapine (SEROQUEL) 25 MG tablet Take 12.5 mg by mouth 2 (two) times daily.    [DISCONTINUED] Calcium-Phosphorus-Vitamin D (CITRACAL CALCIUM GUMMIES PO) Take 1 each by mouth 2 (two) times daily.    No facility-administered encounter medications on file as of 12/13/2018.     Review of Systems  Unable to perform ROS: Dementia    Immunization History  Administered Date(s) Administered   Influenza Whole 06/30/2018   Influenza-Unspecified 07/19/2017   PPD Test 09/05/2016   Pertinent  Health Maintenance Due  Topic Date Due   DEXA SCAN  04/28/1992   PNA vac Low Risk Adult (1 of 2 - PCV13) 04/28/1992   INFLUENZA VACCINE  Completed   Fall Risk  06/15/2018 10/14/2017 01/28/2016  Falls in the past year? Yes Yes No  Number falls in past yr: 1 1 -  Injury with Fall? Yes Yes -  Comment pelvic fracture - -  Risk for fall due to : Impaired balance/gait - -   Functional Status Survey:    Vitals:   12/13/18 0917  BP: (!) 164/80  Pulse: 78  Resp: 18  Temp: 98 F (36.7 C)  SpO2: 98%  Weight: 128 lb 12.8 oz (58.4 kg)  Height: 5\' 1"  (1.549 m)   Body mass index is 24.34 kg/m. Physical Exam Vitals signs reviewed.  Constitutional:      Appearance: Normal appearance.  HENT:     Head: Normocephalic.     Nose: Nose normal.     Mouth/Throat:     Mouth: Mucous membranes are moist.     Pharynx: Oropharynx is clear.  Eyes:     Pupils: Pupils are equal, round, and reactive to light.  Cardiovascular:     Rate and Rhythm: Normal rate and regular rhythm.     Pulses: Normal pulses.     Heart sounds: Normal heart sounds.  Pulmonary:     Effort: Pulmonary effort is normal.     Breath sounds: Normal breath sounds.  Abdominal:     General: Abdomen is flat. Bowel sounds are normal.     Palpations: Abdomen is soft.  Musculoskeletal:     Comments: Trace edema Bilateral  Skin:     General: Skin is warm and dry.  Neurological:     General: No focal deficit present.     Mental Status: She is alert.  Psychiatric:        Mood and Affect: Mood normal.     Comments: Patient was very paranoid about me examining her though she did not resist     Labs reviewed: Recent Labs    12/23/17 05/12/18  NA 136* 140  K 4.6 4.0  CL 105 105  CO2 25 29  BUN 45* 28*  CREATININE 1.0 1.0  CALCIUM 9.2 9.3  Recent Labs    12/23/17 05/12/18  AST 26 15  ALT 26 8  ALKPHOS 95 73  PROT 5.9 5.3  ALBUMIN 3.8 3.7   Recent Labs    12/23/17 05/12/18  WBC 7.7 6.7  HGB 9.9* 10.3*  HCT 29* 32*  PLT 294 210   Lab Results  Component Value Date   TSH 3.28 04/12/2018   Lab Results  Component Value Date   HGBA1C 5.7 09/30/2017   Lab Results  Component Value Date   CHOL 135 12/23/2017   HDL 46 12/23/2017   LDLCALC 74 12/23/2017   TRIG 71 12/23/2017    Significant Diagnostic Results in last 30 days:  No results found.  Assessment/Plan  Essential hypertension BP consistently elevated Will increase her Lisinopril to 20 mg Will repeat BMP Paroxysmal atrial fibrillation  Rate controlled on low dose of Coreg Not on any Antcoagulation Acquired hypothyroidism Last TSH was in 07/19 Will repeat Continue supplement  Late onset Alzheimer's disease with behavioral disturbance  Per Nurses patient continues to have agitation and depressive mood Will continue Seroquel Also on Namenda Will possible consider Zoloft next visit to help with depressive features Anemia,  Repeat CBC Hyperlipidemia On Statin    Family/ staff Communication:   Labs/tests ordered: TSH,CBC,CMP in 1 week of going upon Lisinopril dose  Total time spent in this patient care encounter was 25_ minutes; greater than 50% of the visit spent counseling patient, reviewing records , Labs and coordinating care for problems addressed at this encounter.

## 2018-12-14 ENCOUNTER — Ambulatory Visit: Payer: Medicare Other | Admitting: Neurology

## 2019-01-11 ENCOUNTER — Non-Acute Institutional Stay (SKILLED_NURSING_FACILITY): Payer: Medicare Other | Admitting: Nurse Practitioner

## 2019-01-11 ENCOUNTER — Encounter: Payer: Self-pay | Admitting: Nurse Practitioner

## 2019-01-11 DIAGNOSIS — F0281 Dementia in other diseases classified elsewhere with behavioral disturbance: Secondary | ICD-10-CM | POA: Diagnosis not present

## 2019-01-11 DIAGNOSIS — I130 Hypertensive heart and chronic kidney disease with heart failure and stage 1 through stage 4 chronic kidney disease, or unspecified chronic kidney disease: Secondary | ICD-10-CM | POA: Diagnosis not present

## 2019-01-11 DIAGNOSIS — G301 Alzheimer's disease with late onset: Secondary | ICD-10-CM

## 2019-01-11 DIAGNOSIS — I10 Essential (primary) hypertension: Secondary | ICD-10-CM | POA: Diagnosis not present

## 2019-01-11 DIAGNOSIS — E039 Hypothyroidism, unspecified: Secondary | ICD-10-CM | POA: Diagnosis not present

## 2019-01-11 DIAGNOSIS — F02818 Dementia in other diseases classified elsewhere, unspecified severity, with other behavioral disturbance: Secondary | ICD-10-CM

## 2019-01-11 DIAGNOSIS — N183 Chronic kidney disease, stage 3 (moderate): Secondary | ICD-10-CM

## 2019-01-11 NOTE — Assessment & Plan Note (Signed)
Continue Levothyroxine 35mcg qd, last TSH 3.28 04/12/18, update CBC/diff, TSH

## 2019-01-11 NOTE — [place] Notes (Addendum)
Location:  [place] Room Number: 105 Place of Service:  SNF (31) Provider:  Marlana Latus  NP  Virgie Dad, MD  Patient Care Team: Virgie Dad, MD as PCP - General (Internal Medicine) Marcena Dias X, NP as Nurse Practitioner (Internal Medicine) [name], MD as Attending Physician (Neurology)  Extended Emergency Contact Information Primary Emergency Contact: Key,Caroline Address: [place] Phone: [phone] Mobile Phone: [phone] Relation: Daughter Secondary Emergency Contact: Key,Tim  [place] Mobile Phone: [phone] Relation: Son  Code Status:  DNR Goals of care: Advanced Directive information Advanced Directives 01/11/2019  Does Patient Have a Medical Advance Directive? Yes  Type of Paramedic of [place];Living will;Out of facility DNR (pink MOST or yellow form)  Does patient want to make changes to medical advance directive? No - Patient declined  Copy of [place] in Chart? Yes - validated most recent copy scanned in chart (See row information)  Would patient like information on creating a medical advance directive? -  Pre-existing out of facility DNR order (yellow form or pink MOST form) Yellow form placed in chart (order not valid for inpatient use)     Chief Complaint  Patient presents with  . Medical Management of Chronic Issues    HPI:  Pt is a 85 y.o. female seen today for medical management of chronic diseases.      The patient has hx of HTN, Sbp is 150, she denied headache, change of vision, chest pain/pressure, palpitation, or SOB, on Lisinopril 61m qd, Carvedilol 6.29mbid. She resides in memory care unit, [facility], ambulates with walker, on Memantine 51m26mid, no psychosis, on Quetiapine 12.51mg47md. Hypothyroidism, on Levothyroxine 751mc40m, last TSH 3.28 04/12/18   Past Medical History:   Diagnosis Date  . Adenomatous colon polyp 07/17/97  . Allergic rhinitis   . Allergy   . Anxiety disorder   . Atrial fibrillation (HCC) [place] Chronic anticoagulation   . Diverticulosis   . History of shingles   . Hypercholesteremia   . Hypertension   . Hypothyroidism   . Internal hemorrhoids   . Melanoma (HCC) [place] Memory loss   . Meniere disease   . Tension headache    Past Surgical History:  Procedure Laterality Date  . CATARACT EXTRACTION Right 2012    Allergies  Allergen Reactions  . Amitriptyline     Urinary Retention  . Amlodipine Swelling  . Erythromycin Hives  . Nitrofurantoin Nausea Only  . Sudafed [Pseudoephedrine Hcl]     Unknown reaction per MAR   . Sulfa Antibiotics     "really ill"  . Tavist [Clemastine]     Urinary hesitancy  . Trimethoprim     Cramps  . Verapamil     Constipation    Outpatient Encounter Medications as of 01/11/2019  Medication Sig  . acetaminophen (TYLENOL) 325 MG tablet Take 650 mg by mouth every 6 (six) hours as needed.  . atoMarland Kitchenvastatin (LIPITOR) 10 MG tablet Take 1 tablet (10 mg total) by mouth daily.  . calcium carbonate (CALCIUM 600) 600 MG TABS tablet Take 600 mg by mouth 2 (two) times daily with a meal.  . carvedilol (COREG) 6.25 MG tablet Take 1 tablet (6.25 mg total) by mouth 2 (two) times daily with a meal.  . feeding supplement (BOOST / RESOURCE BREEZE) LIQD Take 1 Container by  mouth at bedtime. Prefers Vanilla flavored  . hydrocortisone (ANUSOL-HC) 2.5 % rectal cream Place 1 application rectally 2 (two) times daily as needed for hemorrhoids.  Marland Kitchen levothyroxine (SYNTHROID, LEVOTHROID) 75 MCG tablet Take 75 mcg by mouth daily.  Marland Kitchen lisinopril (PRINIVIL,ZESTRIL) 10 MG tablet Take 1 tablet (10 mg total) by mouth daily.  . memantine (NAMENDA) 5 MG tablet Take 1 tablet (5 mg total) by mouth 2 (two) times daily.  . QUEtiapine (SEROQUEL) 25 MG tablet Take 12.5 mg by mouth 2 (two) times daily.    No facility-administered encounter  medications on file as of 01/11/2019.    ROS was provided with assistance of staff.  Review of Systems  Constitutional: Negative for activity change, appetite change, chills, diaphoresis, fatigue, fever and unexpected weight change.  HENT: Positive for hearing loss. Negative for congestion and voice change.   Respiratory: Negative for cough, shortness of breath and wheezing.   Cardiovascular: Positive for leg swelling. Negative for chest pain and palpitations.  Gastrointestinal: Negative for abdominal distention, abdominal pain, constipation, diarrhea, nausea and vomiting.  Genitourinary: Negative for difficulty urinating, frequency and urgency.  Musculoskeletal: Positive for gait problem.  Skin: Negative for color change and pallor.  Neurological: Negative for dizziness, speech difficulty, weakness and headaches.       Dementia  Psychiatric/Behavioral: Negative for agitation, behavioral problems, hallucinations and sleep disturbance. The patient is not nervous/anxious.     Immunization History  Administered Date(s) Administered  . Influenza Whole 06/30/2018  . Influenza-Unspecified 07/19/2017  . PPD Test 09/05/2016   Pertinent  Health Maintenance Due  Topic Date Due  . DEXA SCAN  04/28/1992  . PNA vac Low Risk Adult (1 of 2 - PCV13) 04/28/1992  . INFLUENZA VACCINE  04/29/2019   Fall Risk  06/15/2018 10/14/2017 01/28/2016  Falls in the past year? Yes Yes No  Number falls in past yr: 1 1 -  Injury with Fall? Yes Yes -  Comment pelvic fracture - -  Risk for fall due to : Impaired balance/gait - -   Functional Status Survey:    Vitals:   01/11/19 1005  BP: (!) 150/80  Pulse: 82  Resp: 18  Temp: 98.2 F (36.8 C)  SpO2: 98%  Weight: 129 lb 6.4 oz (58.7 kg)  Height: '5\' 1"'  (1.549 m)   Body mass index is 24.45 kg/m. Physical Exam Vitals signs and nursing note reviewed.  Constitutional:      General: She is not in acute distress.    Appearance: Normal appearance. She is  normal weight. She is not ill-appearing, toxic-appearing or diaphoretic.  HENT:     Head: Normocephalic and atraumatic.     Nose: Nose normal.     Mouth/Throat:     Mouth: Mucous membranes are moist.  Eyes:     Extraocular Movements: Extraocular movements intact.     Pupils: Pupils are equal, round, and reactive to light.  Neck:     Musculoskeletal: Normal range of motion.  Cardiovascular:     Rate and Rhythm: Normal rate and regular rhythm.     Heart sounds: No murmur.  Pulmonary:     Effort: Pulmonary effort is normal.     Breath sounds: No wheezing, rhonchi or rales.  Abdominal:     General: There is no distension.     Palpations: Abdomen is soft.     Tenderness: There is no abdominal tenderness. There is no guarding or rebound.  Musculoskeletal:     Right lower leg: Edema present.  Left lower leg: Edema present.     Comments: Chronic trace edema BLE  Skin:    General: Skin is warm and dry.  Neurological:     General: No focal deficit present.     Mental Status: She is alert. Mental status is at baseline.     Cranial Nerves: No cranial nerve deficit.     Motor: No weakness.     Coordination: Coordination normal.     Gait: Gait abnormal.     Comments: Oriented to self.   Psychiatric:        Mood and Affect: Mood normal.        Behavior: Behavior normal.     Comments: Pleasantly sang a song to me during today's visit.      Labs reviewed: Recent Labs    05/12/18  NA 140  K 4.0  CL 105  CO2 29  BUN 28*  CREATININE 1.0  CALCIUM 9.3   Recent Labs    05/12/18  AST 15  ALT 8  ALKPHOS 73  PROT 5.3  ALBUMIN 3.7   Recent Labs    05/12/18  WBC 6.7  HGB 10.3*  HCT 32*  PLT 210   Lab Results  Component Value Date   TSH 3.28 04/12/2018   Lab Results  Component Value Date   HGBA1C 5.7 09/30/2017   Lab Results  Component Value Date   CHOL 135 12/23/2017   HDL 46 12/23/2017   LDLCALC 74 12/23/2017   TRIG 71 12/23/2017    Significant Diagnostic  Results in last 30 days:  No results found.  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 No apparent psychotic symptoms, continue Seroquel 12.5mg 66m.      Family/ staff Communication: plan of care reviewed with the patient and charge nurse.   Labs/tests ordered: CBC/diff, CMP/eGFR, TSH   Time spend 25 minutes.

## 2019-01-11 NOTE — Assessment & Plan Note (Addendum)
Continue memory care unit [facility] for safety and care assistance, continue Memantine 5mg  bid for memory. No apparent psychotic symptoms, continue Seroquel 12.5mg  bid.

## 2019-01-11 NOTE — Assessment & Plan Note (Addendum)
Mildly elevated Sbp, asymptomatic, continue Lisinopril 55m qd, Carvedilol 6.215mbid, update CMP/eGFR 01/12/19 Na 140, K 4.4, Bun 31, creat 1.00, TP 5.6, alubmin 3.8, TSH 1.28, wbc 7.0, Hgb 11.1, plt 218, neutrophils 61.9

## 2019-01-12 DIAGNOSIS — E039 Hypothyroidism, unspecified: Secondary | ICD-10-CM | POA: Diagnosis not present

## 2019-01-12 LAB — CBC AND DIFFERENTIAL
HCT: 33 — AB (ref 36–46)
Hemoglobin: 11.1 — AB (ref 12.0–16.0)
Platelets: 218 (ref 150–399)
WBC: 7

## 2019-01-12 LAB — HEPATIC FUNCTION PANEL
ALT: 10 (ref 7–35)
AST: 14 (ref 13–35)
Alkaline Phosphatase: 63 (ref 25–125)
Bilirubin, Total: 0.5

## 2019-01-12 LAB — BASIC METABOLIC PANEL
BUN: 31 — AB (ref 4–21)
Creatinine: 1 (ref 0.5–1.1)
Glucose: 93
Potassium: 4.4 (ref 3.4–5.3)
Sodium: 140 (ref 137–147)

## 2019-01-12 LAB — TSH: TSH: 1.28 (ref 0.41–5.90)

## 2019-02-06 ENCOUNTER — Encounter: Payer: Self-pay | Admitting: Nurse Practitioner

## 2019-02-06 ENCOUNTER — Non-Acute Institutional Stay (SKILLED_NURSING_FACILITY): Payer: Medicare Other | Admitting: Nurse Practitioner

## 2019-02-06 DIAGNOSIS — F0281 Dementia in other diseases classified elsewhere with behavioral disturbance: Secondary | ICD-10-CM | POA: Diagnosis not present

## 2019-02-06 DIAGNOSIS — N183 Chronic kidney disease, stage 3 unspecified: Secondary | ICD-10-CM

## 2019-02-06 DIAGNOSIS — G309 Alzheimer's disease, unspecified: Secondary | ICD-10-CM | POA: Diagnosis not present

## 2019-02-06 DIAGNOSIS — I130 Hypertensive heart and chronic kidney disease with heart failure and stage 1 through stage 4 chronic kidney disease, or unspecified chronic kidney disease: Secondary | ICD-10-CM

## 2019-02-06 DIAGNOSIS — E039 Hypothyroidism, unspecified: Secondary | ICD-10-CM

## 2019-02-06 DIAGNOSIS — G301 Alzheimer's disease with late onset: Secondary | ICD-10-CM

## 2019-02-06 DIAGNOSIS — F329 Major depressive disorder, single episode, unspecified: Secondary | ICD-10-CM | POA: Diagnosis not present

## 2019-02-06 DIAGNOSIS — F0280 Dementia in other diseases classified elsewhere without behavioral disturbance: Secondary | ICD-10-CM

## 2019-02-06 DIAGNOSIS — F02818 Dementia in other diseases classified elsewhere, unspecified severity, with other behavioral disturbance: Secondary | ICD-10-CM

## 2019-02-06 NOTE — Assessment & Plan Note (Signed)
Stable, continue memory care unit [facility] for safety and care assistance, continue Memantine 5mg  bid.

## 2019-02-06 NOTE — Assessment & Plan Note (Signed)
Stable, last TSH 1.28 01/12/19, continue Levothyroxine 72mcg po qd.

## 2019-02-06 NOTE — Assessment & Plan Note (Signed)
Blood pressure is controlled, last creat 1.00 01/12/19, continue Carvedilol 6.25mg  bid, Lisinopril 10mg  qd.

## 2019-02-06 NOTE — Assessment & Plan Note (Signed)
Her mood is stable, continue Quetiapine 25mg  bid, may consider GDR in the future.

## 2019-02-06 NOTE — Progress Notes (Signed)
Location:  [place] Room Number: 105 Place of Service:  SNF (31) Provider: Drevin Ortner Otho Darner, NP  Virgie Dad, MD  Patient Care Team: Virgie Dad, MD as PCP - General (Internal Medicine) Akaila Rambo X, NP as Nurse Practitioner (Internal Medicine) [name], MD as Attending Physician (Neurology)  Extended Emergency Contact Information Primary Emergency Contact: Key,Caroline Address: [place] Phone: [phone] Mobile Phone: [phone] Relation: Daughter Secondary Emergency Contact: Key,Tim  [place] Mobile Phone: [phone] Relation: Son  Code Status:  DNR Goals of care: Advanced Directive information Advanced Directives 02/06/2019  Does Patient Have a Medical Advance Directive? Yes  Type of Paramedic of [place];Living will;Out of facility DNR (pink MOST or yellow form)  Does patient want to make changes to medical advance directive? No - Patient declined  Copy of [place] in Chart? Yes - validated most recent copy scanned in chart (See row information)  Would patient like information on creating a medical advance directive? -  Pre-existing out of facility DNR order (yellow form or pink MOST form) Yellow form placed in chart (order not valid for inpatient use)     Chief Complaint  Patient presents with  . Medical Management of Chronic Issues    Routine Visit    HPI:  Pt is a 85 y.o. female seen today for medical management of chronic diseases.    The patient resides in memory care unit [facility] for safety and care assistance, on Memantine 5mg  bid for memory. Her mood is stable on Quetiapine 25mg  bid. HTN, blood pressure is controlled on Lisinopril 10mg  qd, Carvedilol 6.25mg  bid. Hypothyroidism, on Levothyroxine 77mcg qd, last TSH 1.28 01/12/19.    Past Medical History:  Diagnosis Date  . Adenomatous colon polyp 07/17/97   . Allergic rhinitis   . Allergy   . Anxiety disorder   . Atrial fibrillation ([place])   . Chronic anticoagulation   . Diverticulosis   . History of shingles   . Hypercholesteremia   . Hypertension   . Hypothyroidism   . Internal hemorrhoids   . Melanoma ([place])   . Memory loss   . Meniere disease   . Tension headache    Past Surgical History:  Procedure Laterality Date  . CATARACT EXTRACTION Right 2012    Allergies  Allergen Reactions  . Amitriptyline     Urinary Retention  . Amlodipine Swelling  . Erythromycin Hives  . Nitrofurantoin Nausea Only  . Sudafed [Pseudoephedrine Hcl]     Unknown reaction per MAR   . Sulfa Antibiotics     "really ill"  . Tavist [Clemastine]     Urinary hesitancy  . Trimethoprim     Cramps  . Verapamil     Constipation    Outpatient Encounter Medications as of 02/06/2019  Medication Sig  . acetaminophen (TYLENOL) 325 MG tablet Take 650 mg by mouth every 6 (six) hours as needed.  Marland Kitchen atorvastatin (LIPITOR) 10 MG tablet Take 1 tablet (10 mg total) by mouth daily.  . calcium carbonate (CALCIUM 600) 600 MG TABS tablet Take 600 mg by mouth 2 (two) times daily with a meal.  . carvedilol (COREG) 6.25 MG tablet Take 1 tablet (6.25 mg total) by mouth 2 (two) times daily with a meal.  . feeding supplement (BOOST / RESOURCE BREEZE) LIQD Take 1 Container by mouth at bedtime. Prefers [employer]  flavored  . hydrocortisone (ANUSOL-HC) 2.5 % rectal cream Place 1 application rectally 2 (two) times daily as needed for hemorrhoids.  Marland Kitchen levothyroxine (SYNTHROID, LEVOTHROID) 75 MCG tablet Take 75 mcg by mouth daily.  Marland Kitchen lisinopril (PRINIVIL,ZESTRIL) 10 MG tablet Take 1 tablet (10 mg total) by mouth daily.  . memantine (NAMENDA) 5 MG tablet Take 1 tablet (5 mg total) by mouth 2 (two) times daily.  . QUEtiapine (SEROQUEL) 25 MG tablet Take 12.5 mg by mouth 2 (two) times daily.    No facility-administered encounter medications on file as of 02/06/2019.    ROS was  provided with assistance of staff Review of Systems  Constitutional: Negative for activity change, appetite change, chills, diaphoresis, fatigue, fever and unexpected weight change.  HENT: Positive for hearing loss. Negative for congestion and voice change.   Eyes: Negative for visual disturbance.  Respiratory: Negative for cough and shortness of breath.   Gastrointestinal: Negative for abdominal distention, abdominal pain, constipation, diarrhea, nausea and vomiting.  Genitourinary: Negative for difficulty urinating, dysuria and urgency.  Musculoskeletal: Positive for gait problem.  Skin: Negative for color change and pallor.  Neurological: Negative for dizziness, speech difficulty and headaches.       [place]  Psychiatric/Behavioral: Negative for agitation, behavioral problems, hallucinations and sleep disturbance. The patient is not nervous/anxious.     Immunization History  Administered Date(s) Administered  . Influenza Whole 06/30/2018  . Influenza-Unspecified 07/19/2017  . PPD Test 09/05/2016   Pertinent  Health Maintenance Due  Topic Date Due  . DEXA SCAN  04/28/1992  . PNA vac Low Risk Adult (1 of 2 - PCV13) 04/28/1992  . INFLUENZA VACCINE  04/29/2019   Fall Risk  06/15/2018 10/14/2017 01/28/2016  Falls in the past year? Yes Yes No  Number falls in past yr: 1 1 -  Injury with Fall? Yes Yes -  Comment pelvic fracture - -  Risk for fall due to : Impaired balance/gait - -   Functional Status Survey:    Vitals:   02/06/19 1232  BP: (!) 160/82  Pulse: 76  Resp: 18  Temp: 99.1 F (37.3 C)  TempSrc: Oral  SpO2: 98%  Weight: 128 lb 12.8 oz (58.4 kg)  Height: 5\' 1"  (1.549 m)   Body mass index is 24.34 kg/m. Physical Exam Vitals signs and nursing note reviewed.  Constitutional:      General: She is not in acute distress.    Appearance: She is normal weight. She is not ill-appearing, toxic-appearing or diaphoretic.  HENT:     Head: Normocephalic and atraumatic.      Nose: Nose normal. No congestion or rhinorrhea.     Mouth/Throat:     Mouth: Mucous membranes are moist.  Eyes:     Extraocular Movements: Extraocular movements intact.     Conjunctiva/sclera: Conjunctivae normal.     Pupils: Pupils are equal, round, and reactive to light.  Neck:     Musculoskeletal: Normal range of motion and neck supple.  Cardiovascular:     Rate and Rhythm: Normal rate and regular rhythm.     Heart sounds: No murmur.  Pulmonary:     Effort: Pulmonary effort is normal.     Breath sounds: No wheezing, rhonchi or rales.  Abdominal:     General: There is no distension.     Palpations: Abdomen is soft.     Tenderness: There is no abdominal tenderness. There is no guarding or rebound.     Hernia: No hernia is present.  Musculoskeletal:  Right lower leg: No edema.     Left lower leg: No edema.     Comments: Furniture/walker to walk.   Skin:    General: Skin is warm and dry.  Neurological:     General: No focal deficit present.     Mental Status: Mental status is at baseline.     Cranial Nerves: No cranial nerve deficit.     Motor: No weakness.     Coordination: Coordination normal.     Gait: Gait abnormal.     Comments: Remembered her birthday. Oriented to person and her room on unit  Psychiatric:        Mood and Affect: Mood normal.        Behavior: Behavior normal.     Labs reviewed: Recent Labs    05/12/18 01/12/19  NA 140 140  K 4.0 4.4  CL 105  --   CO2 29  --   BUN 28* 31*  CREATININE 1.0 1.0  CALCIUM 9.3  --    Recent Labs    05/12/18 01/12/19  AST 15 14  ALT 8 10  ALKPHOS 73 63  PROT 5.3  --   ALBUMIN 3.7  --    Recent Labs    05/12/18 01/12/19  WBC 6.7 7.0  HGB 10.3* 11.1*  HCT 32* 33*  PLT 210 218   Lab Results  Component Value Date   TSH 1.28 01/12/2019   Lab Results  Component Value Date   HGBA1C 5.7 09/30/2017   Lab Results  Component Value Date   CHOL 135 12/23/2017   HDL 46 12/23/2017   LDLCALC 74 12/23/2017    TRIG 71 12/23/2017    Significant Diagnostic Results in last 30 days:  No results found.  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   Hypothyroidism Stable, last TSH 1.28 01/12/19, continue Levothyroxine 42mcg po qd.   Alzheimer's dementia with behavioral disturbance Stable, continue memory care unit [facility] for safety and care assistance, continue Memantine 5mg  bid.   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      Family/ staff Communication: plan of care reviewed with the patient and charge nurse.   Labs/tests ordered: none  Time spend 25 minutes.

## 2019-03-21 ENCOUNTER — Non-Acute Institutional Stay (SKILLED_NURSING_FACILITY): Payer: Medicare Other | Admitting: Internal Medicine

## 2019-03-21 ENCOUNTER — Encounter: Payer: Self-pay | Admitting: Internal Medicine

## 2019-03-21 DIAGNOSIS — I130 Hypertensive heart and chronic kidney disease with heart failure and stage 1 through stage 4 chronic kidney disease, or unspecified chronic kidney disease: Secondary | ICD-10-CM | POA: Diagnosis not present

## 2019-03-21 DIAGNOSIS — F329 Major depressive disorder, single episode, unspecified: Secondary | ICD-10-CM | POA: Diagnosis not present

## 2019-03-21 DIAGNOSIS — D649 Anemia, unspecified: Secondary | ICD-10-CM

## 2019-03-21 DIAGNOSIS — R609 Edema, unspecified: Secondary | ICD-10-CM

## 2019-03-21 DIAGNOSIS — G309 Alzheimer's disease, unspecified: Secondary | ICD-10-CM

## 2019-03-21 DIAGNOSIS — N183 Chronic kidney disease, stage 3 (moderate): Secondary | ICD-10-CM

## 2019-03-21 DIAGNOSIS — F0280 Dementia in other diseases classified elsewhere without behavioral disturbance: Secondary | ICD-10-CM | POA: Diagnosis not present

## 2019-03-21 DIAGNOSIS — E039 Hypothyroidism, unspecified: Secondary | ICD-10-CM | POA: Diagnosis not present

## 2019-03-21 DIAGNOSIS — I480 Paroxysmal atrial fibrillation: Secondary | ICD-10-CM

## 2019-03-21 DIAGNOSIS — E782 Mixed hyperlipidemia: Secondary | ICD-10-CM

## 2019-03-21 NOTE — Progress Notes (Signed)
Location:  [place] Room Number: 105A Place of Service:  SNF [phone]) Provider: Dr. Meredith Staggers L. Dorita Sciara, Rene Kocher, MD  Patient Care Team: Virgie Dad, MD as PCP - General (Internal Medicine) Mast, Man X, NP as Nurse Practitioner (Internal Medicine) [name], MD as Attending Physician (Neurology)  Extended Emergency Contact Information Primary Emergency Contact: Key,Caroline Address: [place] Phone: [phone] Mobile Phone: [phone] Relation: Daughter Secondary Emergency Contact: Key,Tim  [place] Mobile Phone: [phone] Relation: Son  Code Status:  DNR Goals of care: Advanced Directive information Advanced Directives 03/21/2019  Does Patient Have a Medical Advance Directive? Yes  Type of Paramedic of [place];Living will;Out of facility DNR (pink MOST or yellow form)  Does patient want to make changes to medical advance directive? No - Patient declined  Copy of [place] in Chart? Yes - validated most recent copy scanned in chart (See row information)  Would patient like information on creating a medical advance directive? -  Pre-existing out of facility DNR order (yellow form or pink MOST form) Yellow form placed in chart (order not valid for inpatient use)     Chief Complaint  Patient presents with  . Medical Management of Chronic Issues    Routine Visit    HPI:  Pt is a 85 y.o. female seen today for medical management of chronic diseases.    Patient is Long term resident of facility. She is in  Memory Unit.  She has h/o Dementia with Behavior issues, Hypertension, Hypothyrodism, PAF not on Any anticoagulation, Hyperlipemia Patient was ver pleasant today Unable to give any history. Per Nurses she is stable Weight is stable No New Nursing issues No Falls. Appetite is good. No Recent Behavior Issues  Past Medical History:  Diagnosis Date  . Adenomatous colon polyp 07/17/97  . Allergic rhinitis   . Allergy   . Anxiety disorder   . Atrial fibrillation ([place])   . Chronic anticoagulation   . Diverticulosis   . History of shingles   . Hypercholesteremia   . Hypertension   . Hypothyroidism   . Internal hemorrhoids   . Melanoma ([place])   . Memory loss   . Meniere disease   . Tension headache    Past Surgical History:  Procedure Laterality Date  . CATARACT EXTRACTION Right 2012    Allergies  Allergen Reactions  . Amitriptyline     Urinary Retention  . Amlodipine Swelling  . Erythromycin Hives  . Nitrofurantoin Nausea Only  . Sudafed [Pseudoephedrine Hcl]     Unknown reaction per MAR   . Sulfa Antibiotics     "really ill"  . Tavist [Clemastine]     Urinary hesitancy  . Trimethoprim     Cramps  . Verapamil     Constipation    Outpatient Encounter Medications as of 03/21/2019  Medication Sig  . acetaminophen (TYLENOL) 325 MG tablet Take 650 mg by mouth every 6 (six) hours as needed.  Marland Kitchen atorvastatin (LIPITOR) 10 MG tablet Take 1 tablet (10 mg total) by mouth daily.  . calcium carbonate (CALCIUM 600) 600 MG TABS tablet Take 600 mg by mouth 2 (two) times daily with a meal.  . carvedilol (COREG) 6.25 MG tablet Take 1 tablet (6.25 mg total) by mouth 2 (two) times daily with a meal.  . feeding supplement (BOOST / RESOURCE BREEZE)  LIQD Take 1 Container by mouth at bedtime. Prefers Vanilla flavored  . hydrocortisone (ANUSOL-HC) 2.5 % rectal cream Place 1 application rectally 2 (two) times daily as needed for hemorrhoids.  Marland Kitchen levothyroxine (SYNTHROID, LEVOTHROID) 75 MCG tablet Take 75 mcg by mouth daily.  Marland Kitchen lisinopril (PRINIVIL,ZESTRIL) 10 MG tablet Take 1 tablet (10 mg total) by mouth daily.  . memantine (NAMENDA) 5 MG tablet Take 1 tablet (5 mg total) by mouth 2 (two) times daily.  . QUEtiapine (SEROQUEL) 25 MG tablet Take 12.5 mg by mouth 2 (two) times daily.    No  facility-administered encounter medications on file as of 03/21/2019.     Review of Systems  Unable to perform ROS: Dementia    Immunization History  Administered Date(s) Administered  . Influenza Whole 06/30/2018  . Influenza-Unspecified 07/19/2017  . PPD Test 09/05/2016   Pertinent  Health Maintenance Due  Topic Date Due  . DEXA SCAN  04/28/1992  . PNA vac Low Risk Adult (1 of 2 - PCV13) 04/28/1992  . INFLUENZA VACCINE  04/29/2019   Fall Risk  06/15/2018 10/14/2017 01/28/2016  Falls in the past year? Yes Yes No  Number falls in past yr: 1 1 -  Injury with Fall? Yes Yes -  Comment pelvic fracture - -  Risk for fall due to : Impaired balance/gait - -   Functional Status Survey:    Vitals:   03/21/19 1524  BP: (!) 148/68  Pulse: 72  Resp: 20  Temp: 98.2 F (36.8 C)  TempSrc: Oral  SpO2: 98%  Weight: 130 lb (59 kg)  Height: 5\' 1"  (1.549 m)   Body mass index is 24.56 kg/m. Physical Exam  Constitutional:. Well-developed and well-nourished.  HENT:  Head: Normocephalic.  Mouth/Throat: Oropharynx is clear and moist.  Eyes: Pupils are equal, round, and reactive to light.  Neck: Neck supple.  Cardiovascular: Normal rate and normal heart sounds.  No murmur heard. Pulmonary/Chest: Effort normal and breath sounds normal. No respiratory distress. No wheezes. She has no rales.  Abdominal: Soft. Bowel sounds are normal. No distension. There is no tenderness. There is no rebound.  Musculoskeletal: Mild Edema.  Lymphadenopathy: none Neurological:. No Focal Deficits Follow Commands Was confused. Walks with no walker Skin: Skin is warm and dry.  Psychiatric: Normal mood and affect. Behavior is normal. Thought content normal.    Labs reviewed: Recent Labs    05/12/18 01/12/19  NA 140 140  K 4.0 4.4  CL 105  --   CO2 29  --   BUN 28* 31*  CREATININE 1.0 1.0  CALCIUM 9.3  --    Recent Labs    05/12/18 01/12/19  AST 15 14  ALT 8 10  ALKPHOS 73 63  PROT 5.3  --    ALBUMIN 3.7  --    Recent Labs    05/12/18 01/12/19  WBC 6.7 7.0  HGB 10.3* 11.1*  HCT 32* 33*  PLT 210 218   Lab Results  Component Value Date   TSH 1.28 01/12/2019   Lab Results  Component Value Date   HGBA1C 5.7 09/30/2017   Lab Results  Component Value Date   CHOL 135 12/23/2017   HDL 46 12/23/2017   LDLCALC 74 12/23/2017   TRIG 71 12/23/2017    Significant Diagnostic Results in last 30 days:   Assessment/Plan Essential hypertension BP consistently elevated Will Increase her Lisinopril to 20 mg. She is also on Coreg Repeat BMP in 1 week Paroxysmal atrial fibrillation  Rate  controlled on Coreg Not on Any anticoagulant Acquired hypothyroidism TSH normal in 4/20 Same dose of Synthyroid Late onset Alzheimer's disease with behavioral disturbance  Per Nurses continue to have behavior Issues Continue on Seroquel, Namends  Anemia,  Repeat CBC Hyperlipidemia On Statin   Family/ staff Communication:   Labs/tests ordered:  BMP and CBC   Total time spent in this patient care encounter was  _25  minutes; greater than 50% of the visit spent counseling patient and staff, reviewing records , Labs and coordinating care for problems addressed at this encounter.

## 2019-04-17 DIAGNOSIS — B342 Coronavirus infection, unspecified: Secondary | ICD-10-CM | POA: Diagnosis not present

## 2019-04-26 ENCOUNTER — Non-Acute Institutional Stay (SKILLED_NURSING_FACILITY): Payer: Medicare Other | Admitting: Nurse Practitioner

## 2019-04-26 ENCOUNTER — Encounter: Payer: Self-pay | Admitting: Nurse Practitioner

## 2019-04-26 DIAGNOSIS — F0283 Dementia in other diseases classified elsewhere, unspecified severity, with mood disturbance: Secondary | ICD-10-CM

## 2019-04-26 DIAGNOSIS — G301 Alzheimer's disease with late onset: Secondary | ICD-10-CM | POA: Diagnosis not present

## 2019-04-26 DIAGNOSIS — E039 Hypothyroidism, unspecified: Secondary | ICD-10-CM | POA: Diagnosis not present

## 2019-04-26 DIAGNOSIS — G309 Alzheimer's disease, unspecified: Secondary | ICD-10-CM | POA: Diagnosis not present

## 2019-04-26 DIAGNOSIS — F0281 Dementia in other diseases classified elsewhere with behavioral disturbance: Secondary | ICD-10-CM

## 2019-04-26 DIAGNOSIS — N183 Chronic kidney disease, stage 3 unspecified: Secondary | ICD-10-CM

## 2019-04-26 DIAGNOSIS — F0280 Dementia in other diseases classified elsewhere without behavioral disturbance: Secondary | ICD-10-CM | POA: Diagnosis not present

## 2019-04-26 DIAGNOSIS — F329 Major depressive disorder, single episode, unspecified: Secondary | ICD-10-CM

## 2019-04-26 DIAGNOSIS — F02818 Dementia in other diseases classified elsewhere, unspecified severity, with other behavioral disturbance: Secondary | ICD-10-CM

## 2019-04-26 DIAGNOSIS — I130 Hypertensive heart and chronic kidney disease with heart failure and stage 1 through stage 4 chronic kidney disease, or unspecified chronic kidney disease: Secondary | ICD-10-CM

## 2019-04-26 NOTE — Progress Notes (Signed)
Location:   SNF [place] Room Number: 105 Place of Service:  SNF (31) Provider: [facility] Korryn Pancoast NP  Virgie Dad, MD  Patient Care Team: Virgie Dad, MD as PCP - General (Internal Medicine) Alin Chavira X, NP as Nurse Practitioner (Internal Medicine) [name], MD as Attending Physician (Neurology)  Extended Emergency Contact Information Primary Emergency Contact: Key,Caroline Address: [place] Phone: [phone] Mobile Phone: [phone] Relation: Daughter Secondary Emergency Contact: Key,Tim  [place] Mobile Phone: [phone] Relation: Son  Code Status:  DNR Goals of care: Advanced Directive information Advanced Directives 04/26/2019  Does Patient Have a Medical Advance Directive? Yes  Type of Advance Directive Living will;Healthcare Power of [place];Out of facility DNR (pink MOST or yellow form)  Does patient want to make changes to medical advance directive? No - Patient declined  Copy of [place] in Chart? Yes - validated most recent copy scanned in chart (See row information)  Would patient like information on creating a medical advance directive? -  Pre-existing out of facility DNR order (yellow form or pink MOST form) Yellow form placed in chart (order not valid for inpatient use)     Chief Complaint  Patient presents with  . Medical Management of Chronic Issues    routine visit     HPI:  Pt is a 85 y.o. female seen today for medical management of chronic diseases.    The patient resides in memory care unit, [facility] for safety and care assistance, ambulates with walker, on Memantine 5mg  bid for memory. Her mood is stable on Quetiapine 12.5mg  bid. HTN, blood pressure is controlled on Lisinopril 20mg  qd, Carvedilol 6.25mg  bid. Hypothyroidism, on Levothyroxine 49mcg qd, last TSH 1.20 01/12/19  Past Medical History:  Diagnosis Date  . Adenomatous colon  polyp 07/17/97  . Allergic rhinitis   . Allergy   . Anxiety disorder   . Atrial fibrillation ([place])   . Chronic anticoagulation   . Diverticulosis   . History of shingles   . Hypercholesteremia   . Hypertension   . Hypothyroidism   . Internal hemorrhoids   . Melanoma ([place])   . Memory loss   . Meniere disease   . Tension headache    Past Surgical History:  Procedure Laterality Date  . CATARACT EXTRACTION Right 2012    Allergies  Allergen Reactions  . Amitriptyline     Urinary Retention  . Amlodipine Swelling  . Erythromycin Hives  . Nitrofurantoin Nausea Only  . Sudafed [Pseudoephedrine Hcl]     Unknown reaction per MAR   . Sulfa Antibiotics     "really ill"  . Tavist [Clemastine]     Urinary hesitancy  . Trimethoprim     Cramps  . Verapamil     Constipation    Allergies as of 04/26/2019      Reactions   Amitriptyline    Urinary Retention   Amlodipine Swelling   Erythromycin Hives   Nitrofurantoin Nausea Only   Sudafed [pseudoephedrine Hcl]    Unknown reaction per MAR    Sulfa Antibiotics    "really ill"   Tavist [clemastine]    Urinary hesitancy   Trimethoprim    Cramps   Verapamil    Constipation      Medication List       Accurate as of April 26, 2019  4:09 PM. If you have any questions, ask  your nurse or doctor.        acetaminophen 325 MG tablet Commonly known as: TYLENOL Take 650 mg by mouth every 6 (six) hours as needed.   atorvastatin 10 MG tablet Commonly known as: LIPITOR Take 1 tablet (10 mg total) by mouth daily.   Calcium 600 600 MG Tabs tablet Generic drug: calcium carbonate Take 600 mg by mouth 2 (two) times daily with a meal.   carvedilol 6.25 MG tablet Commonly known as: COREG Take 1 tablet (6.25 mg total) by mouth 2 (two) times daily with a meal.   feeding supplement Liqd Take 1 Container by mouth at bedtime. Prefers Vanilla flavored   hydrocortisone 2.5 % rectal cream Commonly known as: ANUSOL-HC Place 1  application rectally 2 (two) times daily as needed for hemorrhoids.   levothyroxine 75 MCG tablet Commonly known as: SYNTHROID Take 75 mcg by mouth daily.   lisinopril 20 MG tablet Commonly known as: ZESTRIL Take 20 mg by mouth daily. What changed: Another medication with the same name was removed. Continue taking this medication, and follow the directions you see here. Changed by: Troi Bechtold X Sonnet Rizor, NP   memantine 5 MG tablet Commonly known as: NAMENDA Take 1 tablet (5 mg total) by mouth 2 (two) times daily.   SEROquel 25 MG tablet Generic drug: QUEtiapine Take 12.5 mg by mouth 2 (two) times daily.      ROS was provided with assistance of staff Review of Systems  Constitutional: Negative for activity change, appetite change, chills, diaphoresis, fatigue, fever and unexpected weight change.  HENT: Positive for hearing loss. Negative for congestion and voice change.   Respiratory: Negative for cough, shortness of breath and wheezing.   Cardiovascular: Negative for chest pain, palpitations and leg swelling.  Gastrointestinal: Negative for abdominal distention, abdominal pain, constipation, diarrhea, nausea and vomiting.  Genitourinary: Negative for difficulty urinating, dysuria and urgency.  Musculoskeletal: Positive for gait problem.  Skin: Negative for color change and pallor.  Neurological: Negative for dizziness, tremors, facial asymmetry, speech difficulty, weakness, numbness and headaches.       Dementia  Psychiatric/Behavioral: Positive for confusion. Negative for agitation, behavioral problems, hallucinations and sleep disturbance. The patient is not nervous/anxious.     Immunization History  Administered Date(s) Administered  . Influenza Whole 06/30/2018  . Influenza-Unspecified 07/19/2017  . PPD Test 09/05/2016   Pertinent  Health Maintenance Due  Topic Date Due  . DEXA SCAN  04/28/1992  . PNA vac Low Risk Adult (1 of 2 - PCV13) 04/28/1992  . INFLUENZA VACCINE   04/29/2019   Fall Risk  06/15/2018 10/14/2017 01/28/2016  Falls in the past year? Yes Yes No  Number falls in past yr: 1 1 -  Injury with Fall? Yes Yes -  Comment pelvic fracture - -  Risk for fall due to : Impaired balance/gait - -   Functional Status Survey:    Vitals:   04/26/19 1207  BP: 130/72  Pulse: 88  Resp: (!) 21  Temp: 99.1 F (37.3 C)  SpO2: 97%  Weight: 130 lb 9.6 oz (59.2 kg)  Height: 5\' 1"  (1.549 m)   Body mass index is 24.68 kg/m. Physical Exam Vitals signs and nursing note reviewed.  Constitutional:      General: She is not in acute distress.    Appearance: Normal appearance. She is normal weight. She is not ill-appearing, toxic-appearing or diaphoretic.  HENT:     Head: Normocephalic and atraumatic.     Nose: Nose normal.  Mouth/Throat:     Mouth: Mucous membranes are moist.  Eyes:     Extraocular Movements: Extraocular movements intact.     Conjunctiva/sclera: Conjunctivae normal.     Pupils: Pupils are equal, round, and reactive to light.  Neck:     Musculoskeletal: Normal range of motion and neck supple.  Cardiovascular:     Rate and Rhythm: Normal rate and regular rhythm.     Heart sounds: No murmur.  Pulmonary:     Effort: Pulmonary effort is normal.     Breath sounds: No wheezing, rhonchi or rales.  Abdominal:     General: Bowel sounds are normal. There is no distension.     Palpations: Abdomen is soft.     Tenderness: There is no abdominal tenderness. There is no right CVA tenderness, left CVA tenderness, guarding or rebound.  Musculoskeletal:     Right lower leg: No edema.     Left lower leg: No edema.     Comments: Ambulates with walker.   Skin:    General: Skin is warm and dry.  Neurological:     General: No focal deficit present.     Mental Status: She is alert. Mental status is at baseline.     Cranial Nerves: No cranial nerve deficit.     Motor: No weakness.     Coordination: Coordination normal.     Gait: Gait abnormal.      Comments: Oriented to self and her room on unit.   Psychiatric:        Mood and Affect: Mood normal.        Behavior: Behavior normal.     Labs reviewed: Recent Labs    05/12/18 01/12/19  NA 140 140  K 4.0 4.4  CL 105  --   CO2 29  --   BUN 28* 31*  CREATININE 1.0 1.0  CALCIUM 9.3  --    Recent Labs    05/12/18 01/12/19  AST 15 14  ALT 8 10  ALKPHOS 73 63  PROT 5.3  --   ALBUMIN 3.7  --    Recent Labs    05/12/18 01/12/19  WBC 6.7 7.0  HGB 10.3* 11.1*  HCT 32* 33*  PLT 210 218   Lab Results  Component Value Date   TSH 1.28 01/12/2019   Lab Results  Component Value Date   HGBA1C 5.7 09/30/2017   Lab Results  Component Value Date   CHOL 135 12/23/2017   HDL 46 12/23/2017   LDLCALC 74 12/23/2017   TRIG 71 12/23/2017    Significant Diagnostic Results in last 30 days:  No results found.  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   Alzheimer's dementia with behavioral disturbance Continue memory care unit [facility] for safety and care assistance, continue Memantine 5mg  bid for memory.   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    Family/ staff Communication: plan of care reviewed with the patient and charge nurse.   Labs/tests ordered: none  Time spend 25 minutes.

## 2019-04-26 NOTE — Assessment & Plan Note (Signed)
Stable, last TSH wnl 1.2 01/12/19, continue Levothyroxine 43mcg qd.

## 2019-04-26 NOTE — Assessment & Plan Note (Signed)
Blood pressure is controlled, continue Carvedilol 6.25mg  bid, Lisinopril 20mg , last creat 1.00 01/12/19

## 2019-04-26 NOTE — Assessment & Plan Note (Signed)
Her mood is stable, continue Quetiapine 12.5mg  bid.

## 2019-04-26 NOTE — Assessment & Plan Note (Signed)
Continue memory care unit [facility] for safety and care assistance, continue Memantine 5mg  bid for memory.

## 2019-05-25 ENCOUNTER — Non-Acute Institutional Stay (SKILLED_NURSING_FACILITY): Payer: Medicare Other | Admitting: Nurse Practitioner

## 2019-05-25 ENCOUNTER — Encounter: Payer: Self-pay | Admitting: Nurse Practitioner

## 2019-05-25 DIAGNOSIS — F02818 Dementia in other diseases classified elsewhere, unspecified severity, with other behavioral disturbance: Secondary | ICD-10-CM

## 2019-05-25 DIAGNOSIS — G309 Alzheimer's disease, unspecified: Secondary | ICD-10-CM | POA: Diagnosis not present

## 2019-05-25 DIAGNOSIS — F0281 Dementia in other diseases classified elsewhere with behavioral disturbance: Secondary | ICD-10-CM

## 2019-05-25 DIAGNOSIS — F0280 Dementia in other diseases classified elsewhere without behavioral disturbance: Secondary | ICD-10-CM

## 2019-05-25 DIAGNOSIS — G301 Alzheimer's disease with late onset: Secondary | ICD-10-CM | POA: Diagnosis not present

## 2019-05-25 DIAGNOSIS — N183 Chronic kidney disease, stage 3 (moderate): Secondary | ICD-10-CM

## 2019-05-25 DIAGNOSIS — I130 Hypertensive heart and chronic kidney disease with heart failure and stage 1 through stage 4 chronic kidney disease, or unspecified chronic kidney disease: Secondary | ICD-10-CM

## 2019-05-25 DIAGNOSIS — E039 Hypothyroidism, unspecified: Secondary | ICD-10-CM | POA: Diagnosis not present

## 2019-05-25 DIAGNOSIS — F329 Major depressive disorder, single episode, unspecified: Secondary | ICD-10-CM

## 2019-05-25 NOTE — Assessment & Plan Note (Signed)
Her mood is stable, continue Quetiapine 12.5mg bid.  

## 2019-05-25 NOTE — Assessment & Plan Note (Signed)
Continue Memory care unit [facility] for safety and care assistance, continue Memantine 5mg  bid.

## 2019-05-25 NOTE — Assessment & Plan Note (Signed)
Blood pressure is controlled, continue Lisinopril 20mg  qd, Carvedilol 6.25mg  bid.

## 2019-05-25 NOTE — Progress Notes (Signed)
Location:  [place] Room Number: 105 Place of Service:  SNF (31) Provider:  , xie  NP  Virgie Dad, MD  Patient Care Team: Virgie Dad, MD as PCP - General (Internal Medicine) ,  X, NP as Nurse Practitioner (Internal Medicine) [name], MD as Attending Physician (Neurology)  Extended Emergency Contact Information Primary Emergency Contact: Key,Caroline Address: [place] Phone: [phone] Mobile Phone: [phone] Relation: Daughter Secondary Emergency Contact: Key,Tim  [place] Mobile Phone: [phone] Relation: Son  Code Status:  DNR Goals of care: Advanced Directive information Advanced Directives 05/25/2019  Does Patient Have a Medical Advance Directive? Yes  Type of Advance Directive Living will;Out of facility DNR (pink MOST or yellow form)  Does patient want to make changes to medical advance directive? No - Patient declined  Copy of [place] in Chart? -  Would patient like information on creating a medical advance directive? -  Pre-existing out of facility DNR order (yellow form or pink MOST form) Yellow form placed in chart (order not valid for inpatient use)     Chief Complaint  Patient presents with  . Medical agement of Chronic Issues    HPI:  Pt is a 85 y.o. female seen today for medical management of chronic diseases.    The patient resides in Memory care unit [facility] for safety, care assistance, she ambulates with walker. Hx of dementia, takes Memantine 5mg  bid, for memory. HTN, blood pressure is controlled on Lisinopril 20mg  qd, Carvedilol 6.25mg  bid. Her mood is stable, on Quetiapine 12.5mg  bid. Hypothyroidism, on Levothyroxine 29mcg qd, last TSH 1.28 01/12/19  Past Medical History:  Diagnosis Date  . Adenomatous colon polyp 07/17/97  . Allergic rhinitis   . Allergy   . Anxiety disorder   . Atrial  fibrillation ([place])   . Chronic anticoagulation   . Diverticulosis   . History of shingles   . Hypercholesteremia   . Hypertension   . Hypothyroidism   . Internal hemorrhoids   . Melanoma ([place])   . Memory loss   . Meniere disease   . Tension headache    Past Surgical History:  Procedure Laterality Date  . CATARACT EXTRACTION Right 2012    Allergies  Allergen Reactions  . Amitriptyline     Urinary Retention  . Amlodipine Swelling  . Erythromycin Hives  . Nitrofurantoin Nausea Only  . Sudafed [Pseudoephedrine Hcl]     Unknown reaction per MAR   . Sulfa Antibiotics     "really ill"  . Tavist [Clemastine]     Urinary hesitancy  . Trimethoprim     Cramps  . Verapamil     Constipation    Outpatient Encounter Medications as of 05/25/2019  Medication Sig  . acetaminophen (TYLENOL) 325 MG tablet Take 650 mg by mouth every 6 (six) hours as needed.  Marland Kitchen atorvastatin (LIPITOR) 10 MG tablet Take 1 tablet (10 mg total) by mouth daily.  . calcium carbonate (CALCIUM 600) 600 MG TABS tablet Take 600 mg by mouth 2 (two) times daily with a meal.  . carvedilol (COREG) 6.25 MG tablet Take 1 tablet (6.25 mg total) by mouth 2 (two) times daily with a meal.  . feeding supplement (BOOST / RESOURCE BREEZE) LIQD Take 1 Container by mouth at bedtime. Prefers Vanilla flavored  . hydrocortisone (ANUSOL-HC) 2.5 % rectal cream Place 1 application rectally 2 (  two) times daily as needed for hemorrhoids.  Marland Kitchen levothyroxine (SYNTHROID, LEVOTHROID) 75 MCG tablet Take 75 mcg by mouth daily.  Marland Kitchen lisinopril (ZESTRIL) 20 MG tablet Take 20 mg by mouth daily.  . memantine (NAMENDA) 5 MG tablet Take 1 tablet (5 mg total) by mouth 2 (two) times daily.  . QUEtiapine (SEROQUEL) 25 MG tablet Take 12.5 mg by mouth 2 (two) times daily.    No facility-administered encounter medications on file as of 05/25/2019.    ROS was provided with assistance of staff Review of Systems  Constitutional: Negative for activity change,  appetite change, chills, diaphoresis, fatigue, fever and unexpected weight change.  HENT: Positive for hearing loss. Negative for congestion and voice change.   Eyes: Negative for visual disturbance.  Respiratory: Negative for cough, shortness of breath and wheezing.   Cardiovascular: Negative for chest pain, palpitations and leg swelling.  Gastrointestinal: Negative for abdominal distention, abdominal pain, constipation, diarrhea, nausea and vomiting.  Genitourinary: Negative for difficulty urinating, dysuria and urgency.  Musculoskeletal: Positive for gait problem.  Skin: Negative for color change and pallor.  Neurological: Negative for dizziness, speech difficulty, weakness and headaches.       Dementia  Psychiatric/Behavioral: Positive for confusion. Negative for agitation, behavioral problems, hallucinations and sleep disturbance. The patient is not nervous/anxious.     Immunization History  Administered Date(s) Administered  . Influenza Whole 06/30/2018  . Influenza-Unspecified 07/19/2017  . PPD Test 09/05/2016   Pertinent  Health Maintenance Due  Topic Date Due  . DEXA SCAN  04/28/1992  . PNA vac Low Risk Adult (1 of 2 - PCV13) 04/28/1992  . INFLUENZA VACCINE  04/29/2019   Fall Risk  06/15/2018 10/14/2017 01/28/2016  Falls in the past year? Yes Yes No  Number falls in past yr: 1 1 -  Injury with Fall? Yes Yes -  Comment pelvic fracture - -  Risk for fall due to : Impaired balance/gait - -   Functional Status Survey:    Vitals:   05/25/19 0857  BP: (!) 114/50  Pulse: 66  Resp: 18  Temp: (!) 97.5 F (36.4 C)  SpO2: 95%  Weight: 130 lb 6.4 oz (59.1 kg)  Height: 5\' 1"  (1.549 m)   Body mass index is 24.64 kg/m. Physical Exam Vitals signs and nursing note reviewed.  Constitutional:      General: She is not in acute distress.    Appearance: Normal appearance. She is normal weight. She is not ill-appearing, toxic-appearing or diaphoretic.  HENT:     Head:  Normocephalic and atraumatic.     Nose: Nose normal.     Mouth/Throat:     Mouth: Mucous membranes are moist.  Eyes:     Extraocular Movements: Extraocular movements intact.     Conjunctiva/sclera: Conjunctivae normal.     Pupils: Pupils are equal, round, and reactive to light.  Neck:     Musculoskeletal: Normal range of motion and neck supple.  Cardiovascular:     Rate and Rhythm: Normal rate and regular rhythm.     Heart sounds: No murmur.  Pulmonary:     Effort: Pulmonary effort is normal.     Breath sounds: No wheezing, rhonchi or rales.  Abdominal:     Palpations: Abdomen is soft.     Tenderness: There is no abdominal tenderness. There is no right CVA tenderness, guarding or rebound.  Musculoskeletal:     Right lower leg: No edema.     Left lower leg: No edema.  Comments: Ambulates with walker.   Skin:    General: Skin is warm and dry.  Neurological:     General: No focal deficit present.     Mental Status: She is alert. Mental status is at baseline.     Cranial Nerves: No cranial nerve deficit.     Motor: No weakness.     Coordination: Coordination normal.     Gait: Gait abnormal.     Comments: Oriented to person, her room on unit  Psychiatric:        Mood and Affect: Mood normal.        Behavior: Behavior normal.     Labs reviewed: Recent Labs    01/12/19  NA 140  K 4.4  BUN 31*  CREATININE 1.0   Recent Labs    01/12/19  AST 14  ALT 10  ALKPHOS 63   Recent Labs    01/12/19  WBC 7.0  HGB 11.1*  HCT 33*  PLT 218   Lab Results  Component Value Date   TSH 1.28 01/12/2019   Lab Results  Component Value Date   HGBA1C 5.7 09/30/2017   Lab Results  Component Value Date   CHOL 135 12/23/2017   HDL 46 12/23/2017   LDLCALC 74 12/23/2017   TRIG 71 12/23/2017    Significant Diagnostic Results in last 30 days:  No results found.  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   Hypothyroidism Last TSH wnl 1.28 01/12/19, continue Levothyroxine 63mcg qd.   Alzheimer's dementia with behavioral disturbance Continue Memory care unit [facility] for safety and care assistance, continue Memantine 5mg  bid.   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   Labs/tests ordered:  none  Time spend 25 minutes.

## 2019-05-25 NOTE — Assessment & Plan Note (Signed)
Last TSH wnl 1.28 01/12/19, continue Levothyroxine 45mcg qd.

## 2019-06-16 ENCOUNTER — Encounter: Payer: Self-pay | Admitting: Internal Medicine

## 2019-06-16 ENCOUNTER — Non-Acute Institutional Stay (SKILLED_NURSING_FACILITY): Payer: Medicare Other | Admitting: Internal Medicine

## 2019-06-16 DIAGNOSIS — E039 Hypothyroidism, unspecified: Secondary | ICD-10-CM | POA: Diagnosis not present

## 2019-06-16 DIAGNOSIS — E782 Mixed hyperlipidemia: Secondary | ICD-10-CM

## 2019-06-16 DIAGNOSIS — N183 Chronic kidney disease, stage 3 (moderate): Secondary | ICD-10-CM

## 2019-06-16 DIAGNOSIS — F0281 Dementia in other diseases classified elsewhere with behavioral disturbance: Secondary | ICD-10-CM | POA: Diagnosis not present

## 2019-06-16 DIAGNOSIS — G301 Alzheimer's disease with late onset: Secondary | ICD-10-CM

## 2019-06-16 DIAGNOSIS — I480 Paroxysmal atrial fibrillation: Secondary | ICD-10-CM

## 2019-06-16 DIAGNOSIS — D649 Anemia, unspecified: Secondary | ICD-10-CM

## 2019-06-16 DIAGNOSIS — I130 Hypertensive heart and chronic kidney disease with heart failure and stage 1 through stage 4 chronic kidney disease, or unspecified chronic kidney disease: Secondary | ICD-10-CM

## 2019-06-16 NOTE — Progress Notes (Signed)
Location:  [facility] Room Number: 105 Place of Service:  SNF [phone]) Provider:  Veleta Miners  MD  Virgie Dad, MD  Patient Care Team: Virgie Dad, MD as PCP - General (Internal Medicine) Mast, Man X, NP as Nurse Practitioner (Internal Medicine) [name], MD as Attending Physician (Neurology)  Extended Emergency Contact Information Primary Emergency Contact: Key,Caroline Address: [place] Phone: [phone] Mobile Phone: [phone] Relation: Daughter Secondary Emergency Contact: Key,Tim  [place] Mobile Phone: [phone] Relation: Son  Code Status:  DNR Goals of care: Advanced Directive information Advanced Directives 06/16/2019  Does Patient Have a Medical Advance Directive? Yes  Type of Advance Directive Out of facility DNR (pink MOST or yellow form)  Does patient want to make changes to medical advance directive? No - Patient declined  Copy of [place] in Chart? -  Would patient like information on creating a medical advance directive? -  Pre-existing out of facility DNR order (yellow form or pink MOST form) Yellow form placed in chart (order not valid for inpatient use)     Chief Complaint  Patient presents with  . Medical Management of Chronic Issues    HPI:  Pt is a 85 y.o. female seen today for medical management of chronic diseases.   Patient is long term Resident of facility She is Memory Unit. She has h/o Dementia with Behavior issues, Hypertension, Hypothyrodism, PAF not on Any anticoagulation, Hyperlipemia Per nurses patient is doing well.  She has not had any falls recently her weight is stable.  No recent behavior issues Patient was little paranoid today and would not give me any history.  But eventually she calmed down and let me examine her   Past Medical History:  Diagnosis Date  . Adenomatous colon polyp  07/17/97  . Allergic rhinitis   . Allergy   . Anxiety disorder   . Atrial fibrillation ([place])   . Chronic anticoagulation   . Diverticulosis   . History of shingles   . Hypercholesteremia   . Hypertension   . Hypothyroidism   . Internal hemorrhoids   . Melanoma ([place])   . Memory loss   . Meniere disease   . Tension headache    Past Surgical History:  Procedure Laterality Date  . CATARACT EXTRACTION Right 2012    Allergies  Allergen Reactions  . Amitriptyline     Urinary Retention  . Amlodipine Swelling  . Erythromycin Hives  . Nitrofurantoin Nausea Only  . Sudafed [Pseudoephedrine Hcl]     Unknown reaction per MAR   . Sulfa Antibiotics     "really ill"  . Tavist [Clemastine]     Urinary hesitancy  . Trimethoprim     Cramps  . Verapamil     Constipation    Outpatient Encounter Medications as of 06/16/2019  Medication Sig  . acetaminophen (TYLENOL) 325 MG tablet Take 650 mg by mouth every 6 (six) hours as needed.  Marland Kitchen atorvastatin (LIPITOR) 10 MG tablet Take 1 tablet (10 mg total) by mouth daily.  . calcium carbonate (CALCIUM 600) 600 MG TABS tablet Take 600 mg by mouth 2 (two) times daily with a meal.  . carvedilol (COREG) 6.25 MG tablet Take 1 tablet (6.25 mg total) by mouth 2 (two) times daily with a meal.  . feeding supplement (BOOST / RESOURCE BREEZE) LIQD Take 1 Container by mouth at  bedtime. Prefers Vanilla flavored  . hydrocortisone (ANUSOL-HC) 2.5 % rectal cream Place 1 application rectally 2 (two) times daily as needed for hemorrhoids.  Marland Kitchen levothyroxine (SYNTHROID, LEVOTHROID) 75 MCG tablet Take 75 mcg by mouth daily.  Marland Kitchen lisinopril (ZESTRIL) 20 MG tablet Take 20 mg by mouth daily.  . memantine (NAMENDA) 5 MG tablet Take 1 tablet (5 mg total) by mouth 2 (two) times daily.  . QUEtiapine (SEROQUEL) 25 MG tablet Take 12.5 mg by mouth 2 (two) times daily.    No facility-administered encounter medications on file as of 06/16/2019.     Review of Systems  Unable to  perform ROS: Dementia    Immunization History  Administered Date(s) Administered  . Influenza Whole 06/30/2018  . Influenza-Unspecified 07/19/2017  . PPD Test 09/05/2016   Pertinent  Health Maintenance Due  Topic Date Due  . DEXA SCAN  04/28/1992  . PNA vac Low Risk Adult (1 of 2 - PCV13) 04/28/1992  . INFLUENZA VACCINE  04/29/2019   Fall Risk  06/15/2018 10/14/2017 01/28/2016  Falls in the past year? Yes Yes No  Number falls in past yr: 1 1 -  Injury with Fall? Yes Yes -  Comment pelvic fracture - -  Risk for fall due to : Impaired balance/gait - -   Functional Status Survey:    Vitals:   06/16/19 1356  BP: (!) 150/80  Pulse: 84  Resp: 20  Temp: (!) 97.3 F (36.3 C)  SpO2: 97%  Weight: 127 lb 12.8 oz (58 kg)  Height: 5\' 1"  (1.549 m)   Body mass index is 24.15 kg/m. Physical Exam  Constitutional:  Well-developed and well-nourished.  HENT:  Head: Normocephalic.  Mouth/Throat: Oropharynx is clear and moist.  Eyes: Pupils are equal, round, and reactive to light.  Neck: Neck supple.  Cardiovascular: Normal rate and normal heart sounds.  No murmur heard. Pulmonary/Chest: Effort normal and breath sounds normal. No respiratory distress. No wheezes. She has no rales.  Abdominal: Soft. Bowel sounds are normal. No distension. There is no tenderness. There is no rebound.  Musculoskeletal: No edema.  Lymphadenopathy: none Neurological: No Focal Deficits Walks with the walker Was Paranoid about the exam Skin: Skin is warm and dry.  Psychiatric: Normal mood and affect. Behavior is normal. Thought content normal.    Labs reviewed: Recent Labs    01/12/19  NA 140  K 4.4  BUN 31*  CREATININE 1.0   Recent Labs    01/12/19  AST 14  ALT 10  ALKPHOS 63   Recent Labs    01/12/19  WBC 7.0  HGB 11.1*  HCT 33*  PLT 218   Lab Results  Component Value Date   TSH 1.28 01/12/2019   Lab Results  Component Value Date   HGBA1C 5.7 09/30/2017   Lab Results   Component Value Date   CHOL 135 12/23/2017   HDL 46 12/23/2017   LDLCALC 74 12/23/2017   TRIG 71 12/23/2017    Significant Diagnostic Results in last 30 days:  No results found.  Assessment/Plan Essential hypertension Her BP is high today but  usually has been running near 130 Will not change anything Continue Lisinopril and Coreg Paroxysmal atrial fibrillation Rate controlled on Coreg Not on any anticoagulation Acquired hypothyroidism TSH normal in 4/20 Late onset Alzheimer's disease with behavioral disturbance On Namenda and Seroquel Will not change anything Can consider increasing Namenda next visit patient's weight stays stable At the continue Seroquel for her behavior issues Anemia, Hemoglobin low  but stable Hyperlipidemia On Statin   Family/ staff Communication:   Labs/tests ordered:    Total time spent in this patient care encounter was  25_  minutes; greater than 50% of the visit spent counseling patient and staff, reviewing records , Labs and coordinating care for problems addressed at this encounter.

## 2019-06-27 DIAGNOSIS — Z03818 Encounter for observation for suspected exposure to other biological agents ruled out: Secondary | ICD-10-CM | POA: Diagnosis not present

## 2019-07-04 DIAGNOSIS — Z03818 Encounter for observation for suspected exposure to other biological agents ruled out: Secondary | ICD-10-CM | POA: Diagnosis not present

## 2019-07-24 ENCOUNTER — Encounter: Payer: Self-pay | Admitting: Nurse Practitioner

## 2019-07-24 ENCOUNTER — Non-Acute Institutional Stay (SKILLED_NURSING_FACILITY): Payer: Medicare Other | Admitting: Nurse Practitioner

## 2019-07-24 DIAGNOSIS — E039 Hypothyroidism, unspecified: Secondary | ICD-10-CM

## 2019-07-24 DIAGNOSIS — G309 Alzheimer's disease, unspecified: Secondary | ICD-10-CM | POA: Diagnosis not present

## 2019-07-24 DIAGNOSIS — F0281 Dementia in other diseases classified elsewhere with behavioral disturbance: Secondary | ICD-10-CM | POA: Diagnosis not present

## 2019-07-24 DIAGNOSIS — N183 Chronic kidney disease, stage 3 unspecified: Secondary | ICD-10-CM

## 2019-07-24 DIAGNOSIS — I130 Hypertensive heart and chronic kidney disease with heart failure and stage 1 through stage 4 chronic kidney disease, or unspecified chronic kidney disease: Secondary | ICD-10-CM

## 2019-07-24 NOTE — Assessment & Plan Note (Addendum)
Mildly elevated Sbp in 150-160s, continue Carvedilol 6.88m bid, will increase Lisinopril 320mqd, VS q shift x 7 days, Bun/creat 31/1.00, eGFR 49.  Observe.

## 2019-07-24 NOTE — Assessment & Plan Note (Signed)
Stable, continue Levothyroxine 64mcg qd, last TSH 1.28 01/12/19

## 2019-07-24 NOTE — Progress Notes (Addendum)
Location:   SNF [facility] Room Number: 105 Place of Service:  SNF (31) Provider:  Mast, Man NP  Virgie Dad, MD  Patient Care Team: Virgie Dad, MD as PCP - General (Internal Medicine) Mast, Man X, NP as Nurse Practitioner (Internal Medicine) [name], MD as Attending Physician (Neurology)  Extended Emergency Contact Information Primary Emergency Contact: Key,Caroline Address: [place] Phone: [phone] Mobile Phone: [phone] Relation: Daughter Secondary Emergency Contact: Key,Tim  [place] Mobile Phone: [phone] Relation: Son  Code Status:  DNR Goals of care: Advanced Directive information Advanced Directives 07/24/2019  Does Patient Have a Medical Advance Directive? Yes  Type of Advance Directive Living will;Out of facility DNR (pink MOST or yellow form)  Does patient want to make changes to medical advance directive? No - Patient declined  Copy of [place] in Chart? -  Would patient like information on creating a medical advance directive? -  Pre-existing out of facility DNR order (yellow form or pink MOST form) Yellow form placed in chart (order not valid for inpatient use)     Chief Complaint  Patient presents with  . Medical Management of Chronic Issues  . Health Maintenance    Influenza vaccine, TDAP, PCV13    HPI:  Pt is a 85 y.o. female seen today for medical management of chronic diseases.    The patient resides in Memory care unit [facility], on Memantine 66m bid. Hx of HTN, blood pressure is not well controlled Sbp in 150-160s, on Lisinopril 255mqd, Carvedilol 6.2578mid, she denied headache, dizziness, change of vision, chest pain, pressure, palpitation. Her mood is stable, on Quetiapine 12.5mg49md. Hx of Hypothyroidism, stable on Levothyroxine 75mc72m, last TSH 1.28 01/12/19.   Past Medical History:  Diagnosis Date  . Adenomatous  colon polyp 07/17/97  . Allergic rhinitis   . Allergy   . Anxiety disorder   . Atrial fibrillation (HCC) [place] Chronic anticoagulation   . Diverticulosis   . History of shingles   . Hypercholesteremia   . Hypertension   . Hypothyroidism   . Internal hemorrhoids   . Melanoma (HCC) [place] Memory loss   . Meniere disease   . Tension headache    Past Surgical History:  Procedure Laterality Date  . CATARACT EXTRACTION Right 2012    Allergies  Allergen Reactions  . Amitriptyline     Urinary Retention  . Amlodipine Swelling  . Erythromycin Hives  . Nitrofurantoin Nausea Only  . Sudafed [Pseudoephedrine Hcl]     Unknown reaction per MAR   . Sulfa Antibiotics     "really ill"  . Tavist [Clemastine]     Urinary hesitancy  . Trimethoprim     Cramps  . Verapamil     Constipation    Allergies as of 07/24/2019      Reactions   Amitriptyline    Urinary Retention   Amlodipine Swelling   Erythromycin Hives   Nitrofurantoin Nausea Only   Sudafed [pseudoephedrine Hcl]    Unknown reaction per MAR    Sulfa Antibiotics    "really ill"   Tavist [clemastine]    Urinary hesitancy   Trimethoprim    Cramps   Verapamil    Constipation      Medication List       Accurate as of July 24, 2019  2:34 PM. If you  have any questions, ask your nurse or doctor.        acetaminophen 325 MG tablet Commonly known as: TYLENOL Take 650 mg by mouth every 6 (six) hours as needed.   atorvastatin 10 MG tablet Commonly known as: LIPITOR Take 1 tablet (10 mg total) by mouth daily.   Calcium 600 600 MG Tabs tablet Generic drug: calcium carbonate Take 600 mg by mouth 2 (two) times daily with a meal.   carvedilol 6.25 MG tablet Commonly known as: COREG Take 1 tablet (6.25 mg total) by mouth 2 (two) times daily with a meal.   feeding supplement Liqd Take 1 Container by mouth at bedtime. Prefers Vanilla flavored   hydrocortisone 2.5 % rectal cream Commonly known as: ANUSOL-HC Place 1  application rectally 2 (two) times daily as needed for hemorrhoids.   levothyroxine 75 MCG tablet Commonly known as: SYNTHROID Take 75 mcg by mouth daily.   lisinopril 20 MG tablet Commonly known as: ZESTRIL Take 20 mg by mouth daily.   memantine 5 MG tablet Commonly known as: NAMENDA Take 1 tablet (5 mg total) by mouth 2 (two) times daily.   SEROquel 25 MG tablet Generic drug: QUEtiapine Take 12.5 mg by mouth 2 (two) times daily.      ROS was provided with assistance of staff.  Review of Systems  Constitutional: Negative for activity change, appetite change, chills, diaphoresis, fatigue, fever and unexpected weight change.  HENT: Positive for hearing loss. Negative for congestion and voice change.   Respiratory: Negative for cough, shortness of breath and wheezing.   Cardiovascular: Negative for chest pain, palpitations and leg swelling.  Gastrointestinal: Negative for abdominal distention, abdominal pain, constipation, diarrhea, nausea and vomiting.  Genitourinary: Negative for difficulty urinating, dysuria and urgency.  Musculoskeletal: Positive for gait problem.  Skin: Negative for color change and pallor.  Neurological: Negative for dizziness, speech difficulty, weakness and headaches.       Dementia  Psychiatric/Behavioral: Positive for confusion. Negative for agitation, behavioral problems, hallucinations and sleep disturbance.    Immunization History  Administered Date(s) Administered  . Influenza Whole 06/30/2018  . Influenza-Unspecified 07/19/2017  . PPD Test 09/05/2016   Pertinent  Health Maintenance Due  Topic Date Due  . PNA vac Low Risk Adult (1 of 2 - PCV13) 04/28/1992  . INFLUENZA VACCINE  04/29/2019  . DEXA SCAN  07/23/2021 (Originally 04/28/1992)   Fall Risk  06/15/2018 10/14/2017 01/28/2016  Falls in the past year? Yes Yes No  Number falls in past yr: 1 1 -  Injury with Fall? Yes Yes -  Comment pelvic fracture - -  Risk for fall due to : Impaired  balance/gait - -   Functional Status Survey:    Vitals:   07/24/19 0950  BP: (!) 156/80  Pulse: 87  Resp: 20  Temp: (!) 97.1 F (36.2 C)  SpO2: 97%  Weight: 129 lb (58.5 kg)  Height: '5\' 1"'  (1.549 m)   Body mass index is 24.37 kg/m. Physical Exam Vitals signs and nursing note reviewed.  Constitutional:      General: She is not in acute distress.    Appearance: Normal appearance. She is normal weight. She is not ill-appearing, toxic-appearing or diaphoretic.  HENT:     Head: Normocephalic and atraumatic.     Nose: Nose normal.     Mouth/Throat:     Mouth: Mucous membranes are moist.  Eyes:     Extraocular Movements: Extraocular movements intact.     Conjunctiva/sclera: Conjunctivae normal.  Pupils: Pupils are equal, round, and reactive to light.  Neck:     Musculoskeletal: Normal range of motion and neck supple.  Cardiovascular:     Rate and Rhythm: Normal rate and regular rhythm.     Heart sounds: No murmur.  Pulmonary:     Breath sounds: No wheezing, rhonchi or rales.  Abdominal:     General: Bowel sounds are normal.     Palpations: Abdomen is soft.     Tenderness: There is no abdominal tenderness. There is no right CVA tenderness, left CVA tenderness, guarding or rebound.  Musculoskeletal:     Right lower leg: No edema.     Left lower leg: No edema.  Skin:    General: Skin is warm and dry.  Neurological:     General: No focal deficit present.     Mental Status: She is alert. Mental status is at baseline.     Comments: Oriented to self.   Psychiatric:        Mood and Affect: Mood normal.        Behavior: Behavior normal.     Labs reviewed: Recent Labs    01/12/19  NA 140  K 4.4  BUN 31*  CREATININE 1.0   Recent Labs    01/12/19  AST 14  ALT 10  ALKPHOS 63   Recent Labs    01/12/19  WBC 7.0  HGB 11.1*  HCT 33*  PLT 218   Lab Results  Component Value Date   TSH 1.28 01/12/2019   Lab Results  Component Value Date   HGBA1C 5.7  09/30/2017   Lab Results  Component Value Date   CHOL 135 12/23/2017   HDL 46 12/23/2017   LDLCALC 74 12/23/2017   TRIG 71 12/23/2017    Significant Diagnostic Results in last 30 days:  No results found.  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  Observe.   Hypothyroidism Stable, continue Levothyroxine 7582mqd, last TSH 1.28 01/12/19  Alzheimer's dementia with behavioral disturbance Stable, continue Memantine 5mg71md, Seroquel 12.5mg 14m.      Family/ staff Communication: plan of care reviewed with the patient and charge nurse. PCV 13, TDAP ordered.   Labs/tests ordered: none  Time spend 25 minutes.

## 2019-07-24 NOTE — Assessment & Plan Note (Signed)
Stable, continue Memantine 5mg  bid, Seroquel 12.5mg  bid.

## 2019-07-25 DIAGNOSIS — Z03818 Encounter for observation for suspected exposure to other biological agents ruled out: Secondary | ICD-10-CM | POA: Diagnosis not present

## 2019-08-01 DIAGNOSIS — Z03818 Encounter for observation for suspected exposure to other biological agents ruled out: Secondary | ICD-10-CM | POA: Diagnosis not present

## 2019-08-08 DIAGNOSIS — Z03818 Encounter for observation for suspected exposure to other biological agents ruled out: Secondary | ICD-10-CM | POA: Diagnosis not present

## 2019-08-15 DIAGNOSIS — Z20828 Contact with and (suspected) exposure to other viral communicable diseases: Secondary | ICD-10-CM | POA: Diagnosis not present

## 2019-08-16 ENCOUNTER — Non-Acute Institutional Stay (SKILLED_NURSING_FACILITY): Payer: Medicare Other | Admitting: Nurse Practitioner

## 2019-08-16 ENCOUNTER — Encounter: Payer: Self-pay | Admitting: Nurse Practitioner

## 2019-08-16 DIAGNOSIS — F0281 Dementia in other diseases classified elsewhere with behavioral disturbance: Secondary | ICD-10-CM

## 2019-08-16 DIAGNOSIS — I130 Hypertensive heart and chronic kidney disease with heart failure and stage 1 through stage 4 chronic kidney disease, or unspecified chronic kidney disease: Secondary | ICD-10-CM

## 2019-08-16 DIAGNOSIS — G309 Alzheimer's disease, unspecified: Secondary | ICD-10-CM

## 2019-08-16 DIAGNOSIS — E039 Hypothyroidism, unspecified: Secondary | ICD-10-CM

## 2019-08-16 DIAGNOSIS — N183 Chronic kidney disease, stage 3 unspecified: Secondary | ICD-10-CM

## 2019-08-16 DIAGNOSIS — I480 Paroxysmal atrial fibrillation: Secondary | ICD-10-CM | POA: Diagnosis not present

## 2019-08-16 NOTE — Assessment & Plan Note (Signed)
Heart rate is in control.  

## 2019-08-16 NOTE — Assessment & Plan Note (Signed)
Last TSH 1.28 01/12/19, continue Levothyroxine 3mg qd, update TSH, CBC/diff, CMP/eGFR.

## 2019-08-16 NOTE — Assessment & Plan Note (Signed)
Sbp in 150s, asymptomatic in hypertensive aspect, continue Lisinopril 30mg  qd, Carvedilol 6.25mg  bid, Atorvastatin 10mg  qd.

## 2019-08-16 NOTE — Progress Notes (Signed)
Location:   SNF [place] Room Number: 105 Place of Service:  SNF (31) Provider:  ,  NP  Virgie Dad, MD  Patient Care Team: Virgie Dad, MD as PCP - General (Internal Medicine) ,  X, NP as Nurse Practitioner (Internal Medicine) [name], MD as Attending Physician (Neurology)  Extended Emergency Contact Information Primary Emergency Contact: Key,Caroline Address: [place] Phone: [phone] Mobile Phone: [phone] Relation: Daughter Secondary Emergency Contact: Key,Tim  [place] Mobile Phone: [phone] Relation: Son  Code Status:  DNR Goals of care: Advanced Directive information Advanced Directives 07/24/2019  Does Patient Have a Medical Advance Directive? Yes  Type of Advance Directive Living will;Out of facility DNR (pink MOST or yellow form)  Does patient want to make changes to medical advance directive? No - Patient declined  Copy of [place] in Chart? -  Would patient like information on creating a medical advance directive? -  Pre-existing out of facility DNR order (yellow form or pink MOST form) Yellow form placed in chart (order not valid for inpatient use)     Chief Complaint  Patient presents with  . Medical agement of Chronic Issues  . Health Maintenance    TDAP, PCV13 and influenza vaccine    HPI:  Pt is a 85 y.o. female seen today for medical management of chronic diseases.    The patient resides in memory care unit [facility] for safety, care assistance, taking Memantine 63m bid for memory. Her mood is stable, on Quetiapine 12.583mbid. HTN, blood pressure is controlled on Lisinopril 3067md, Carvedilol 6.60m3md, and Atorvastatin 10mg70m Hx of Hypothyroidism, on Levothyroxine 75mc 78mlast TSH wnl. AFib, heart rate is in control.    Past Medical History:  Diagnosis Date  . Adenomatous colon polyp 07/17/97  .  Allergic rhinitis   . Allergy   . Anxiety disorder   . Atrial fibrillation (HCC)  StarbuckChronic anticoagulation   . Diverticulosis   . History of shingles   . Hypercholesteremia   . Hypertension   . Hypothyroidism   . Internal hemorrhoids   . Melanoma (HCC)  MaxeysMemory loss   . Meniere disease   . Tension headache    Past Surgical History:  Procedure Laterality Date  . CATARACT EXTRACTION Right 2012    Allergies  Allergen Reactions  . Amitriptyline     Urinary Retention  . Amlodipine Swelling  . Erythromycin Hives  . Nitrofurantoin Nausea Only  . Sudafed [Pseudoephedrine Hcl]     Unknown reaction per MAR   . Sulfa Antibiotics     "really ill"  . Tavist [Clemastine]     Urinary hesitancy  . Trimethoprim     Cramps  . Verapamil     Constipation    Allergies as of 08/16/2019      Reactions   Amitriptyline    Urinary Retention   Amlodipine Swelling   Erythromycin Hives   Nitrofurantoin Nausea Only   Sudafed [pseudoephedrine Hcl]    Unknown reaction per MAR    Sulfa Antibiotics    "really ill"   Tavist [clemastine]    Urinary hesitancy   Trimethoprim    Cramps   Verapamil    Constipation      Medication List       Accurate as of August 16, 2019  1:52 PM. If you have any  questions, ask your nurse or doctor.        acetaminophen 325 MG tablet Commonly known as: TYLENOL Take 650 mg by mouth every 6 (six) hours as needed.   atorvastatin 10 MG tablet Commonly known as: LIPITOR Take 1 tablet (10 mg total) by mouth daily.   Calcium 600 600 MG Tabs tablet Generic drug: calcium carbonate Take 600 mg by mouth 2 (two) times daily with a meal.   carvedilol 6.25 MG tablet Commonly known as: COREG Take 1 tablet (6.25 mg total) by mouth 2 (two) times daily with a meal.   feeding supplement Liqd Take 1 Container by mouth at bedtime. Prefers Vanilla flavored   hydrocortisone 2.5 % rectal cream Commonly known as: ANUSOL-HC Place 1 application rectally 2  (two) times daily as needed for hemorrhoids.   levothyroxine 75 MCG tablet Commonly known as: SYNTHROID Take 75 mcg by mouth daily.   lisinopril 30 MG tablet Commonly known as: ZESTRIL Take 30 mg by mouth daily.   memantine 5 MG tablet Commonly known as: NAMENDA Take 1 tablet (5 mg total) by mouth 2 (two) times daily.   SEROquel 25 MG tablet Generic drug: QUEtiapine Take 12.5 mg by mouth 2 (two) times daily.      ROS was provided with assistance of staff.  Review of Systems  Constitutional: Negative for activity change, appetite change, chills, diaphoresis, fatigue, fever and unexpected weight change.  HENT: Positive for hearing loss. Negative for congestion and voice change.   Respiratory: Negative for cough, shortness of breath and wheezing.   Cardiovascular: Negative for chest pain, palpitations and leg swelling.  Gastrointestinal: Negative for abdominal distention, abdominal pain, constipation, diarrhea and vomiting.  Genitourinary: Negative for difficulty urinating, dysuria and urgency.  Musculoskeletal: Positive for arthralgias and gait problem. Negative for back pain.  Skin: Negative for color change and pallor.  Neurological: Negative for dizziness, speech difficulty, weakness and headaches.       Dementia  Psychiatric/Behavioral: Positive for agitation, behavioral problems, confusion and dysphoric mood. Negative for hallucinations and sleep disturbance. The patient is not nervous/anxious.     Immunization History  Administered Date(s) Administered  . Influenza Whole 06/30/2018  . Influenza-Unspecified 07/19/2017  . PPD Test 09/05/2016   Pertinent  Health Maintenance Due  Topic Date Due  . PNA vac Low Risk Adult (1 of 2 - PCV13) 04/28/1992  . INFLUENZA VACCINE  04/29/2019  . DEXA SCAN  07/23/2021 (Originally 04/28/1992)   Fall Risk  06/15/2018 10/14/2017 01/28/2016  Falls in the past year? Yes Yes No  Number falls in past yr: 1 1 -  Injury with Fall? Yes Yes -   Comment pelvic fracture - -  Risk for fall due to : Impaired balance/gait - -   Functional Status Survey:    Vitals:   08/16/19 1032  BP: (!) 150/66  Pulse: 76  Resp: 18  Temp: (!) 97.3 F (36.3 C)  SpO2: 96%  Weight: 127 lb 9.6 oz (57.9 kg)  Height: '5\' 1"'  (1.549 m)   Body mass index is 24.11 kg/m. Physical Exam Vitals signs and nursing note reviewed.  Constitutional:      General: She is not in acute distress.    Appearance: Normal appearance. She is not ill-appearing, toxic-appearing or diaphoretic.  HENT:     Head: Normocephalic and atraumatic.     Nose: Nose normal.     Mouth/Throat:     Mouth: Mucous membranes are moist.  Eyes:     Extraocular Movements: Extraocular  movements intact.     Conjunctiva/sclera: Conjunctivae normal.     Pupils: Pupils are equal, round, and reactive to light.  Neck:     Musculoskeletal: Normal range of motion and neck supple.  Cardiovascular:     Rate and Rhythm: Normal rate and regular rhythm.     Heart sounds: No murmur.  Pulmonary:     Breath sounds: No wheezing, rhonchi or rales.  Abdominal:     General: Bowel sounds are normal. There is no distension.     Palpations: Abdomen is soft.     Tenderness: There is no abdominal tenderness. There is no right CVA tenderness, left CVA tenderness, guarding or rebound.  Musculoskeletal:     Right lower leg: No edema.     Left lower leg: No edema.  Skin:    General: Skin is warm and dry.  Neurological:     General: No focal deficit present.     Mental Status: She is alert. Mental status is at baseline.     Motor: No weakness.     Coordination: Coordination normal.     Gait: Gait abnormal.     Comments: Oriented to self.   Psychiatric:     Comments: Confused.      Labs reviewed: Recent Labs    01/12/19  NA 140  K 4.4  BUN 31*  CREATININE 1.0   Recent Labs    01/12/19  AST 14  ALT 10  ALKPHOS 63   Recent Labs    01/12/19  WBC 7.0  HGB 11.1*  HCT 33*  PLT 218    Lab Results  Component Value Date   TSH 1.28 01/12/2019   Lab Results  Component Value Date   HGBA1C 5.7 09/30/2017   Lab Results  Component Value Date   CHOL 135 12/23/2017   HDL 46 12/23/2017   LDLCALC 74 12/23/2017   TRIG 71 12/23/2017    Significant Diagnostic Results in last 30 days:  No results found.  Assessment/Plan Hypothyroidism Last TSH 1.28 01/12/19, continue Levothyroxine 53mg qd, update TSH, CBC/diff, CMP/eGFR.   Alzheimer's dementia with behavioral disturbance [facility], wt #[phone] 06/16/19, 08/16/19, will  increase Memantine 175mbid for memory. 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     Family/ staff Communication: plan of care reviewed with the patient and charge nurse.   Labs/tests ordered:  CBC/diff, CMP/eGR, TSH  Time spend 25 minutes.

## 2019-08-16 NOTE — Assessment & Plan Note (Addendum)
Continue memory care unit [facility] for safety, care assistance, wt #[phone] 06/16/19, 08/16/19, will  increase Memantine 10mg  bid for memory. Continue Quetiapine

## 2019-08-17 DIAGNOSIS — E039 Hypothyroidism, unspecified: Secondary | ICD-10-CM | POA: Diagnosis not present

## 2019-08-17 DIAGNOSIS — I10 Essential (primary) hypertension: Secondary | ICD-10-CM | POA: Diagnosis not present

## 2019-08-18 LAB — BASIC METABOLIC PANEL
BUN: 29 — AB (ref 4–21)
CO2: 27 — AB (ref 13–22)
Chloride: 105 (ref 99–108)
Creatinine: 1 (ref 0.5–1.1)
Glucose: 89
Potassium: 4.6 (ref 3.4–5.3)
Sodium: 142 (ref 137–147)

## 2019-08-18 LAB — CBC AND DIFFERENTIAL
HCT: 35 — AB (ref 36–46)
Hemoglobin: 11.8 — AB (ref 12.0–16.0)
Neutrophils Absolute: 4959
Platelets: 231 (ref 150–399)
WBC: 7.7

## 2019-08-18 LAB — CBC: RBC: 3.78 — AB (ref 3.87–5.11)

## 2019-08-18 LAB — HEPATIC FUNCTION PANEL
ALT: 11 (ref 7–35)
AST: 15 (ref 13–35)
Alkaline Phosphatase: 79 (ref 25–125)
Bilirubin, Total: 0.5

## 2019-08-18 LAB — TSH: TSH: 1.28 (ref 0.41–5.90)

## 2019-08-18 LAB — COMPREHENSIVE METABOLIC PANEL: Calcium: 9.6 (ref 8.7–10.7)

## 2019-09-12 DIAGNOSIS — Z20828 Contact with and (suspected) exposure to other viral communicable diseases: Secondary | ICD-10-CM | POA: Diagnosis not present

## 2019-09-14 ENCOUNTER — Non-Acute Institutional Stay (SKILLED_NURSING_FACILITY): Payer: Medicare Other | Admitting: Internal Medicine

## 2019-09-14 ENCOUNTER — Encounter: Payer: Self-pay | Admitting: Internal Medicine

## 2019-09-14 DIAGNOSIS — I10 Essential (primary) hypertension: Secondary | ICD-10-CM | POA: Diagnosis not present

## 2019-09-14 DIAGNOSIS — I480 Paroxysmal atrial fibrillation: Secondary | ICD-10-CM | POA: Diagnosis not present

## 2019-09-14 DIAGNOSIS — E039 Hypothyroidism, unspecified: Secondary | ICD-10-CM

## 2019-09-14 DIAGNOSIS — G309 Alzheimer's disease, unspecified: Secondary | ICD-10-CM | POA: Diagnosis not present

## 2019-09-14 DIAGNOSIS — F0281 Dementia in other diseases classified elsewhere with behavioral disturbance: Secondary | ICD-10-CM

## 2019-09-14 NOTE — Progress Notes (Signed)
Location:   [facility] Room Number: 105 Place of Service:  SNF [phone]) Provider: Virgie Dad, MD  Virgie Dad, MD  Patient Care Team: Virgie Dad, MD as PCP - General (Internal Medicine) Mast, Man X, NP as Nurse Practitioner (Internal Medicine) [name], MD as Attending Physician (Neurology)  Extended Emergency Contact Information Primary Emergency Contact: Key,Caroline Address: [place] Phone: [phone] Mobile Phone: [phone] Relation: Daughter Secondary Emergency Contact: Key,Tim  [place] Mobile Phone: [phone] Relation: Son  Code Status:  DNR Goals of care: Advanced Directive information Advanced Directives 09/14/2019  Does Patient Have a Medical Advance Directive? Yes  Type of Advance Directive Out of facility DNR (pink MOST or yellow form)  Does patient want to make changes to medical advance directive? No - Patient declined  Copy of [place] in Chart? -  Would patient like information on creating a medical advance directive? -  Pre-existing out of facility DNR order (yellow form or pink MOST form) Yellow form placed in chart (order not valid for inpatient use)     Chief Complaint  Patient presents with  . Medical Management of Chronic Issues    HPI:  Pt is a 85 y.o. [place] seen today for medical management of chronic diseases.   Patient is long term Resident of facility She is Memory Unit. She has h/o Dementia with Behavior issues, Hypertension, Hypothyrodism, PAF not on Any anticoagulation, Hyperlipemia    Past Medical History:  Diagnosis Date  . Adenomatous colon polyp 07/17/97  . Allergic rhinitis   . Allergy   . Anxiety disorder   . Atrial fibrillation ([place])   . Chronic anticoagulation   . Diverticulosis   . History of shingles   . Hypercholesteremia   . Hypertension   . Hypothyroidism   . Internal  hemorrhoids   . Melanoma ([facility])   . Memory loss   . Meniere disease   . Tension headache    Past Surgical History:  Procedure Laterality Date  . CATARACT EXTRACTION Right 2012    Allergies  Allergen Reactions  . Amitriptyline     Urinary Retention  . Amlodipine Swelling  . Erythromycin Hives  . Nitrofurantoin Nausea Only  . Sudafed [Pseudoephedrine Hcl]     Unknown reaction per MAR   . Sulfa Antibiotics     "really ill"  . Tavist [Clemastine]     Urinary hesitancy  . Trimethoprim     Cramps  . Verapamil     Constipation    Allergies as of 09/14/2019      Reactions   Amitriptyline    Urinary Retention   Amlodipine Swelling   Erythromycin Hives   Nitrofurantoin Nausea Only   Sudafed [pseudoephedrine Hcl]    Unknown reaction per MAR    Sulfa Antibiotics    "really ill"   Tavist [clemastine]    Urinary hesitancy   Trimethoprim    Cramps   Verapamil    Constipation      Medication List       Accurate as of September 14, 2019 10:15 AM. If you have any questions, ask your nurse or doctor.        acetaminophen 325 MG tablet Commonly known as: TYLENOL Take 650 mg by mouth every 6 (six) hours as needed.   atorvastatin 10 MG tablet Commonly known as: LIPITOR Take 1 tablet (10  mg total) by mouth daily.   Calcium 600 600 MG Tabs tablet Generic drug: calcium carbonate Take 600 mg by mouth 2 (two) times daily with a meal.   carvedilol 6.25 MG tablet Commonly known as: COREG Take 1 tablet (6.25 mg total) by mouth 2 (two) times daily with a meal.   feeding supplement Liqd Take 1 Container by mouth at bedtime. Prefers Vanilla flavored   hydrocortisone 2.5 % rectal cream Commonly known as: ANUSOL-HC Place 1 application rectally 2 (two) times daily as needed for hemorrhoids.   levothyroxine 75 MCG tablet Commonly known as: SYNTHROID Take 75 mcg by mouth daily.   lisinopril 30 MG tablet Commonly known as: ZESTRIL Take 30 mg by mouth daily.   memantine  10 MG tablet Commonly known as: NAMENDA Take 10 mg by mouth 2 (two) times daily. What changed: Another medication with the same name was removed. Continue taking this medication, and follow the directions you see here. Changed by: Virgie Dad, MD   SEROquel 25 MG tablet Generic drug: QUEtiapine Take 12.5 mg by mouth 2 (two) times daily.       Review of Systems  Immunization History  Administered Date(s) Administered  . Influenza Whole 06/30/2018  . Influenza, High Dose Seasonal PF 06/30/2019  . Influenza-Unspecified 07/19/2017  . PPD Test 09/05/2016  . Pneumococcal-Unspecified 08/06/2014  . Tetanus 07/13/2012   Pertinent  Health Maintenance Due  Topic Date Due  . PNA vac Low Risk Adult (2 of 2 - PCV13) 08/07/2015  . DEXA SCAN  07/23/2021 (Originally 04/28/1992)  . INFLUENZA VACCINE  Completed   Fall Risk  06/15/2018 10/14/2017 01/28/2016  Falls in the past year? Yes Yes No  Number falls in past yr: 1 1 -  Injury with Fall? Yes Yes -  Comment pelvic fracture - -  Risk for fall due to : Impaired balance/gait - -   Functional Status Survey:    Vitals:   09/14/19 1011  BP: 138/Jamie  Pulse: 75  Resp: 18  Temp: 98.4 F (36.9 C)  SpO2: 95%  Weight: 126 lb 9.6 oz (57.4 kg)  Height: 5\' 1"  (1.549 m)   Body mass index is 23.92 kg/m. Physical Exam  Labs reviewed: Recent Labs    01/12/19 0000 08/18/19 0000  NA 140 142  K 4.4 4.6  CL  --  105  CO2  --  27*  BUN 31* 29*  CREATININE 1.0 1.0  CALCIUM  --  9.6   Recent Labs    01/12/19 0000 08/18/19 0000  AST 14 15  ALT 10 11  ALKPHOS 63 79   Recent Labs    01/12/19 0000 08/18/19 0000  WBC 7.0 7.7  NEUTROABS  --  4,959  HGB 11.1* 11.8*  HCT 33* 35*  PLT 218 231   Lab Results  Component Value Date   TSH 1.28 08/18/2019   Lab Results  Component Value Date   HGBA1C 5.7 09/30/2017   Lab Results  Component Value Date   CHOL 135 12/23/2017   HDL 46 12/23/2017   LDLCALC 74 12/23/2017   TRIG 71  12/23/2017    Significant Diagnostic Results in last 30 days:  No results found.  Assessment/Plan  Essential hypertension Doing well on Lisinopril and Coreg  Paroxysmal atrial fibrillation On Coreg Rate control Not on any Anticoagulation Acquired hypothyroidism TSH normal in 11/20  Late onset Alzheimer's disease with behavioral disturbance On Namenda and Seroquel Stable  Would not change anything NO GDR Anemia, Hgb  stable Hyperlipidemia On Statin Will need Lipid Panel checked next visit  Family/ staff Communication:   Labs/tests ordered:    Total time spent in this patient care encounter was  25_  minutes; greater than 50% of the visit spent counseling patient and staff, reviewing records , Labs and coordinating care for problems addressed at this encounter.

## 2019-09-19 DIAGNOSIS — Z20828 Contact with and (suspected) exposure to other viral communicable diseases: Secondary | ICD-10-CM | POA: Diagnosis not present

## 2019-09-25 DIAGNOSIS — Z20828 Contact with and (suspected) exposure to other viral communicable diseases: Secondary | ICD-10-CM | POA: Diagnosis not present

## 2019-10-02 DIAGNOSIS — Z03818 Encounter for observation for suspected exposure to other biological agents ruled out: Secondary | ICD-10-CM | POA: Diagnosis not present

## 2019-10-09 DIAGNOSIS — Z20828 Contact with and (suspected) exposure to other viral communicable diseases: Secondary | ICD-10-CM | POA: Diagnosis not present

## 2019-10-13 ENCOUNTER — Non-Acute Institutional Stay (SKILLED_NURSING_FACILITY): Payer: Medicare Other | Admitting: Nurse Practitioner

## 2019-10-13 ENCOUNTER — Encounter: Payer: Self-pay | Admitting: Nurse Practitioner

## 2019-10-13 DIAGNOSIS — G309 Alzheimer's disease, unspecified: Secondary | ICD-10-CM | POA: Diagnosis not present

## 2019-10-13 DIAGNOSIS — E039 Hypothyroidism, unspecified: Secondary | ICD-10-CM | POA: Diagnosis not present

## 2019-10-13 DIAGNOSIS — F0281 Dementia in other diseases classified elsewhere with behavioral disturbance: Secondary | ICD-10-CM

## 2019-10-13 DIAGNOSIS — N183 Chronic kidney disease, stage 3 unspecified: Secondary | ICD-10-CM | POA: Diagnosis not present

## 2019-10-13 DIAGNOSIS — I130 Hypertensive heart and chronic kidney disease with heart failure and stage 1 through stage 4 chronic kidney disease, or unspecified chronic kidney disease: Secondary | ICD-10-CM | POA: Diagnosis not present

## 2019-10-13 DIAGNOSIS — E782 Mixed hyperlipidemia: Secondary | ICD-10-CM | POA: Diagnosis not present

## 2019-10-13 NOTE — Progress Notes (Signed)
Location:   [facility] Room Number: 105 Place of Service:  SNF (31) Provider:  Shelvy Heckert NP  Virgie Dad, MD  Patient Care Team: Virgie Dad, MD as PCP - General (Internal Medicine) Noma Quijas X, NP as Nurse Practitioner (Internal Medicine) [name], MD as Attending Physician (Neurology)  Extended Emergency Contact Information Primary Emergency Contact: Key,Caroline Address: [place] Phone: [phone] Mobile Phone: [phone] Relation: Daughter Secondary Emergency Contact: Key,Tim  [place] Mobile Phone: [phone] Relation: Son  Code Status:  DNR Goals of care: Advanced Directive information Advanced Directives 10/13/2019  Does Patient Have a Medical Advance Directive? Yes  Type of Advance Directive Out of facility DNR (pink MOST or yellow form);Living will;Healthcare Power of Attorney  Does patient want to make changes to medical advance directive? No - Patient declined  Copy of [place] in Chart? Yes - validated most recent copy scanned in chart (See row information)  Would patient like information on creating a medical advance directive? -  Pre-existing out of facility DNR order (yellow form or pink MOST form) Yellow form placed in chart (order not valid for inpatient use);Pink MOST form placed in chart (order not valid for inpatient use)     Chief Complaint  Patient presents with  . Medical Management of Chronic Issues  . Health Maintenance    PCV13    HPI:  Pt is a 86 y.o. female seen today for medical management of chronic diseases.    The patient resides in memory care unit [facility] for safety, care assistance, ambulates with walker, on Memantine 10mg  bid for memory, her mood is stable on Quetiapine 12.5mg  bid. Hx of HTN, blood pressure is controlled on Lisinopril 30mg  qd, Carvedilol 6.25mg  bid. Hypothyroidism, stable, TSH wnl  07/2019, on Levothyroxine 53mcg qd. Hyperlipidemia, taking Atorvastatin   Past Medical History:  Diagnosis Date  . Adenomatous colon polyp 07/17/97  . Allergic rhinitis   . Allergy   . Anxiety disorder   . Atrial fibrillation ([place])   . Chronic anticoagulation   . Diverticulosis   . History of shingles   . Hypercholesteremia   . Hypertension   . Hypothyroidism   . Internal hemorrhoids   . Melanoma ([place])   . Memory loss   . Meniere disease   . Tension headache    Past Surgical History:  Procedure Laterality Date  . CATARACT EXTRACTION Right 2012    Allergies  Allergen Reactions  . Amitriptyline     Urinary Retention  . Amlodipine Swelling  . Erythromycin Hives  . Nitrofurantoin Nausea Only  . Sudafed [Pseudoephedrine Hcl]     Unknown reaction per MAR   . Sulfa Antibiotics     "really ill"  . Tavist [Clemastine]     Urinary hesitancy  . Trimethoprim     Cramps  . Verapamil     Constipation    Allergies as of 10/13/2019      Reactions   Amitriptyline    Urinary Retention   Amlodipine Swelling   Erythromycin Hives   Nitrofurantoin Nausea Only   Sudafed [pseudoephedrine Hcl]    Unknown reaction per MAR    Sulfa Antibiotics    "really ill"   Tavist [clemastine]    Urinary hesitancy   Trimethoprim    Cramps   Verapamil    Constipation      Medication List  Accurate as of October 13, 2019  2:40 PM. If you have any questions, ask your nurse or doctor.        acetaminophen 325 MG tablet Commonly known as: TYLENOL Take 650 mg by mouth every 6 (six) hours as needed.   atorvastatin 10 MG tablet Commonly known as: LIPITOR Take 1 tablet (10 mg total) by mouth daily.   Calcium 600 600 MG Tabs tablet Generic drug: calcium carbonate Take 600 mg by mouth 2 (two) times daily with a meal.   carvedilol 6.25 MG tablet Commonly known as: COREG Take 1 tablet (6.25 mg total) by mouth 2 (two) times daily with a meal.   feeding supplement Liqd Take 1  Container by mouth at bedtime. Prefers Vanilla flavored   hydrocortisone 2.5 % rectal cream Commonly known as: ANUSOL-HC Place 1 application rectally 2 (two) times daily as needed for hemorrhoids.   levothyroxine 75 MCG tablet Commonly known as: SYNTHROID Take 75 mcg by mouth daily.   lisinopril 30 MG tablet Commonly known as: ZESTRIL Take 30 mg by mouth daily.   memantine 10 MG tablet Commonly known as: NAMENDA Take 10 mg by mouth 2 (two) times daily.   SEROquel 25 MG tablet Generic drug: QUEtiapine Take 12.5 mg by mouth 2 (two) times daily.      ROS was provided with assistance of staff.  Review of Systems  Constitutional: Negative for activity change, appetite change, chills, diaphoresis, fatigue, fever and unexpected weight change.  HENT: Positive for hearing loss. Negative for congestion and voice change.   Respiratory: Negative for cough, shortness of breath and wheezing.   Cardiovascular: Negative for chest pain, palpitations and leg swelling.  Gastrointestinal: Negative for abdominal distention, abdominal pain, constipation, diarrhea, nausea and vomiting.  Genitourinary: Negative for difficulty urinating, dysuria and urgency.  Musculoskeletal: Positive for arthralgias and gait problem.  Skin: Negative for color change and pallor.  Neurological: Negative for dizziness, speech difficulty, weakness and headaches.       Dementia  Psychiatric/Behavioral: Positive for confusion. Negative for agitation, behavioral problems, hallucinations and sleep disturbance. The patient is not nervous/anxious.     Immunization History  Administered Date(s) Administered  . Influenza Whole 06/30/2018  . Influenza, High Dose Seasonal PF 06/30/2019  . Influenza-Unspecified 07/19/2017  . PPD Test 09/05/2016  . Pneumococcal-Unspecified 08/06/2014  . Tetanus 07/13/2012   Pertinent  Health Maintenance Due  Topic Date Due  . PNA vac Low Risk Adult (2 of 2 - PCV13) 08/07/2015  . DEXA SCAN   07/23/2021 (Originally 04/28/1992)  . INFLUENZA VACCINE  Completed   Fall Risk  06/15/2018 10/14/2017 01/28/2016  Falls in the past year? Yes Yes No  Number falls in past yr: 1 1 -  Injury with Fall? Yes Yes -  Comment pelvic fracture - -  Risk for fall due to : Impaired balance/gait - -   Functional Status Survey:    Vitals:   10/13/19 0910  BP: 114/72  Pulse: 64  Resp: 18  Temp: (!) 97.3 F (36.3 C)  SpO2: 98%  Weight: 124 lb 12.8 oz (56.6 kg)  Height: 5\' 1"  (1.549 m)   Body mass index is 23.58 kg/m. Physical Exam Vitals and nursing note reviewed.  Constitutional:      General: She is not in acute distress.    Appearance: Normal appearance. She is not ill-appearing, toxic-appearing or diaphoretic.  HENT:     Head: Normocephalic and atraumatic.     Nose: Nose normal.     Mouth/Throat:  Mouth: Mucous membranes are moist.  Eyes:     Extraocular Movements: Extraocular movements intact.     Conjunctiva/sclera: Conjunctivae normal.     Pupils: Pupils are equal, round, and reactive to light.  Cardiovascular:     Rate and Rhythm: Normal rate and regular rhythm.     Heart sounds: No murmur.  Pulmonary:     Breath sounds: No wheezing, rhonchi or rales.  Chest:     Chest wall: No tenderness.  Abdominal:     General: Bowel sounds are normal. There is no distension.     Palpations: Abdomen is soft.     Tenderness: There is no abdominal tenderness. There is no right CVA tenderness, left CVA tenderness, guarding or rebound.  Musculoskeletal:     Cervical back: Normal range of motion and neck supple.     Right lower leg: No edema.     Left lower leg: No edema.  Skin:    General: Skin is warm and dry.  Neurological:     General: No focal deficit present.     Mental Status: She is alert. Mental status is at baseline.     Motor: No weakness.     Coordination: Coordination normal.     Gait: Gait abnormal.     Comments: Oriented to self.   Psychiatric:        Mood and  Affect: Mood normal.     Comments: Pleasantly confused, followed simple directions during today's examination.      Labs reviewed: Recent Labs    01/12/19 0000 08/18/19 0000  NA 140 142  K 4.4 4.6  CL  --  105  CO2  --  27*  BUN 31* 29*  CREATININE 1.0 1.0  CALCIUM  --  9.6   Recent Labs    01/12/19 0000 08/18/19 0000  AST 14 15  ALT 10 11  ALKPHOS 63 79   Recent Labs    01/12/19 0000 08/18/19 0000  WBC 7.0 7.7  NEUTROABS  --  4,959  HGB 11.1* 11.8*  HCT 33* 35*  PLT 218 231   Lab Results  Component Value Date   TSH 1.28 08/18/2019   Lab Results  Component Value Date   HGBA1C 5.7 09/30/2017   Lab Results  Component Value Date   CHOL 135 12/23/2017   HDL 46 12/23/2017   LDLCALC 74 12/23/2017   TRIG 71 12/23/2017    Significant Diagnostic Results in last 30 days:  No results found.  Assessment/Plan Hypothyroidism Stable, TSH 1.28 08/17/19, continue Levothyroxine 52mcg qd.   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   Alzheimer's dementia with behavioral disturbance Continue Memory care unit [facility] for safety, care assistance, ambulate with walker, continue Memantine 10mg  bid for memory, continue Quetiapine 12.5mg  bid for mood/beahviors.   Hyperlipidemia Taking Atorvastatin, update lipid panel.      Family/ staff Communication: plan of care reviewed with the patient and charge nurse.   Labs/tests ordered:  Lipid panel  Time spend 25 minutes.

## 2019-10-13 NOTE — Assessment & Plan Note (Signed)
Taking Atorvastatin, update lipid panel.

## 2019-10-13 NOTE — Assessment & Plan Note (Addendum)
Continue Memory care unit [facility] for safety, care assistance, ambulate with walker, continue Memantine 10mg  bid for memory, continue Quetiapine 12.5mg  bid for mood/beahviors.

## 2019-10-13 NOTE — Assessment & Plan Note (Signed)
Blood pressure is controlled, creat 1.0 12/2018 and 07/2019, no apparent SOB or edema in BLE, continue Lisinopril, Carvedilol.

## 2019-10-13 NOTE — Assessment & Plan Note (Signed)
Stable, TSH 1.28 08/17/19, continue Levothyroxine 68mcg qd.

## 2019-10-16 DIAGNOSIS — Z20828 Contact with and (suspected) exposure to other viral communicable diseases: Secondary | ICD-10-CM | POA: Diagnosis not present

## 2019-10-17 DIAGNOSIS — E785 Hyperlipidemia, unspecified: Secondary | ICD-10-CM | POA: Diagnosis not present

## 2019-10-17 LAB — LIPID PANEL
Cholesterol: 150 (ref 0–200)
HDL: 76 — AB (ref 35–70)
LDL Cholesterol: 79
Triglycerides: 55 (ref 40–160)

## 2019-10-23 DIAGNOSIS — Z20828 Contact with and (suspected) exposure to other viral communicable diseases: Secondary | ICD-10-CM | POA: Diagnosis not present

## 2019-10-28 DIAGNOSIS — Z23 Encounter for immunization: Secondary | ICD-10-CM | POA: Diagnosis not present

## 2019-10-30 DIAGNOSIS — Z20828 Contact with and (suspected) exposure to other viral communicable diseases: Secondary | ICD-10-CM | POA: Diagnosis not present

## 2019-11-06 DIAGNOSIS — Z20828 Contact with and (suspected) exposure to other viral communicable diseases: Secondary | ICD-10-CM | POA: Diagnosis not present

## 2019-11-09 ENCOUNTER — Non-Acute Institutional Stay (SKILLED_NURSING_FACILITY): Payer: Medicare Other | Admitting: Internal Medicine

## 2019-11-09 ENCOUNTER — Encounter: Payer: Self-pay | Admitting: Internal Medicine

## 2019-11-09 DIAGNOSIS — I10 Essential (primary) hypertension: Secondary | ICD-10-CM | POA: Diagnosis not present

## 2019-11-09 DIAGNOSIS — F0281 Dementia in other diseases classified elsewhere with behavioral disturbance: Secondary | ICD-10-CM | POA: Diagnosis not present

## 2019-11-09 DIAGNOSIS — F02818 Dementia in other diseases classified elsewhere, unspecified severity, with other behavioral disturbance: Secondary | ICD-10-CM

## 2019-11-09 DIAGNOSIS — G301 Alzheimer's disease with late onset: Secondary | ICD-10-CM

## 2019-11-09 DIAGNOSIS — H938X3 Other specified disorders of ear, bilateral: Secondary | ICD-10-CM | POA: Diagnosis not present

## 2019-11-09 NOTE — Progress Notes (Signed)
Location: [facility] manager of Service:  SNF (31)  Provider:   Code Status: DNR Goals of Care:  Advanced Directives 10/13/2019  Does Patient Have a Medical Advance Directive? Yes  Type of Advance Directive Out of facility DNR (pink MOST or yellow form);Living will;Healthcare Power of Attorney  Does patient want to make changes to medical advance directive? No - Patient declined  Copy of [place] in Chart? Yes - validated most recent copy scanned in chart (See row information)  Would patient like information on creating a medical advance directive? -  Pre-existing out of facility DNR order (yellow form or pink MOST form) Yellow form placed in chart (order not valid for inpatient use);Pink MOST form placed in chart (order not valid for inpatient use)     Chief Complaint  Patient presents with  . Acute Visit    HPI: Patient is a 86 y.o. female seen today for an acute visit for Cannot hear from Left Ear  Patient is long term Resident of facility She is Memory Unit. She has h/o Dementia with Behavior issues, Hypertension, Hypothyrodism, PAF not on Any anticoagulation, Hyperlipemia Told Nurses she cannot hear from her Left ear Seems Blocked.  Was very resistant today for exam . Says' Nothing Bothers me'. Denies any pain or any Dizziness.  Past Medical History:  Diagnosis Date  . Adenomatous colon polyp 07/17/97  . Allergic rhinitis   . Allergy   . Anxiety disorder   . Atrial fibrillation ([place])   . Chronic anticoagulation   . Diverticulosis   . History of shingles   . Hypercholesteremia   . Hypertension   . Hypothyroidism   . Internal hemorrhoids   . Melanoma ([place])   . Memory loss   . Meniere disease   . Tension headache     Past Surgical History:  Procedure Laterality Date  . CATARACT EXTRACTION Right 2012    Allergies  Allergen Reactions  . Amitriptyline     Urinary Retention  . Amlodipine Swelling  . Erythromycin Hives  .  Nitrofurantoin Nausea Only  . Sudafed [Pseudoephedrine Hcl]     Unknown reaction per MAR   . Sulfa Antibiotics     "really ill"  . Tavist [Clemastine]     Urinary hesitancy  . Trimethoprim     Cramps  . Verapamil     Constipation    Outpatient Encounter Medications as of 11/09/2019  Medication Sig  . acetaminophen (TYLENOL) 325 MG tablet Take 650 mg by mouth every 6 (six) hours as needed.  Marland Kitchen atorvastatin (LIPITOR) 10 MG tablet Take 1 tablet (10 mg total) by mouth daily.  . calcium carbonate (CALCIUM 600) 600 MG TABS tablet Take 600 mg by mouth 2 (two) times daily with a meal.  . carvedilol (COREG) 6.25 MG tablet Take 1 tablet (6.25 mg total) by mouth 2 (two) times daily with a meal.  . feeding supplement (BOOST / RESOURCE BREEZE) LIQD Take 1 Container by mouth at bedtime. Prefers Vanilla flavored  . hydrocortisone (ANUSOL-HC) 2.5 % rectal cream Place 1 application rectally 2 (two) times daily as needed for hemorrhoids.  Marland Kitchen levothyroxine (SYNTHROID, LEVOTHROID) 75 MCG tablet Take 75 mcg by mouth daily.  Marland Kitchen lisinopril (ZESTRIL) 30 MG tablet Take 30 mg by mouth daily.   . memantine (NAMENDA) 10 MG tablet Take 10 mg by mouth 2 (two) times daily.  . QUEtiapine (SEROQUEL) 25 MG tablet Take 12.5 mg by mouth 2 (two) times daily.  No facility-administered encounter medications on file as of 11/09/2019.    Review of Systems:  Review of Systems  Unable to perform ROS: Dementia    Health Maintenance  Topic Date Due  . PNA vac Low Risk Adult (2 of 2 - PCV13) 08/07/2015  . DEXA SCAN  07/23/2021 (Originally 04/28/1992)  . TETANUS/TDAP  07/13/2022  . INFLUENZA VACCINE  Completed    Physical Exam: Vitals:   11/09/19 1531  BP: (!) 148/78  Pulse: 76  Resp: (!) 22  Temp: 97.8 F (36.6 C)  Weight: 127 lb (57.6 kg)   Body mass index is 24 kg/m. Physical Exam  Constitutional:  Well-developed and well-nourished.  HENT:  Head: Normocephalic. Ears Had Clogged with Wax in both ears  Mouth/Throat: Oropharynx is clear and moist.  Eyes: Pupils are equal, round, and reactive to light.  Neck: Neck supple.  Cardiovascular: Normal rate and normal heart sounds.  No murmur heard. Pulmonary/Chest: Effort normal and breath sounds normal. No respiratory distress. No wheezes. She has no rales.  Abdominal: Soft. Bowel sounds are normal. No distension. There is no tenderness. There is no rebound.  Musculoskeletal: No edema.  Lymphadenopathy: none Neurological:  Walks with No assists  Skin: Skin is warm and dry.  Psychiatric: Normal mood and affect. Behavior is normal. Thought content normal.    Labs reviewed: Basic Metabolic Panel: Recent Labs    01/12/19 0000 08/18/19 0000  NA 140 142  K 4.4 4.6  CL  --  105  CO2  --  27*  BUN 31* 29*  CREATININE 1.0 1.0  CALCIUM  --  9.6  TSH 1.28 1.28   Liver Function Tests: Recent Labs    01/12/19 0000 08/18/19 0000  AST 14 15  ALT 10 11  ALKPHOS 63 79   No results for input(s): LIPASE, AMYLASE in the last 8760 hours. No results for input(s): AMMONIA in the last 8760 hours. CBC: Recent Labs    01/12/19 0000 08/18/19 0000  WBC 7.0 7.7  NEUTROABS  --  4,959  HGB 11.1* 11.8*  HCT 33* 35*  PLT 218 231   Lipid Panel: No results for input(s): CHOL, HDL, LDLCALC, TRIG, CHOLHDL, LDLDIRECT in the last 8760 hours. Lab Results  Component Value Date   HGBA1C 5.7 09/30/2017    Procedures since last visit: No results found.  Assessment/Plan  EAR Wax wrote for [facility] for 5 days Ear Wash if Patient lets Nurses do it  Essential hypertension Stable on Coreg and Lisinopril  Paroxysmal atrial fibrillation On Coreg Rate control Not on any Anticoagulation Acquired hypothyroidism TSH normal in 11/20  Late onset Alzheimer's disease with behavioral disturbance On Namenda and Seroquel Stable  Would not change anything NO GDR Continues to have behavior Issues Anemia, Hgb stable Hyperlipidemia On Statin  Will need Lipid Panel checked next visit  Labs/tests ordered:  * No order type specified * Next appt:  Visit date not found  Total time spent in this patient care encounter was  25_  minutes; greater than 50% of the visit spent counselingstaff, reviewing records , Labs and coordinating care for problems addressed at this encounter.

## 2019-11-13 DIAGNOSIS — Z20828 Contact with and (suspected) exposure to other viral communicable diseases: Secondary | ICD-10-CM | POA: Diagnosis not present

## 2019-11-15 ENCOUNTER — Encounter: Payer: Self-pay | Admitting: Nurse Practitioner

## 2019-11-15 ENCOUNTER — Non-Acute Institutional Stay (SKILLED_NURSING_FACILITY): Payer: Medicare Other | Admitting: Nurse Practitioner

## 2019-11-15 DIAGNOSIS — G309 Alzheimer's disease, unspecified: Secondary | ICD-10-CM

## 2019-11-15 DIAGNOSIS — N183 Chronic kidney disease, stage 3 unspecified: Secondary | ICD-10-CM | POA: Diagnosis not present

## 2019-11-15 DIAGNOSIS — E039 Hypothyroidism, unspecified: Secondary | ICD-10-CM

## 2019-11-15 DIAGNOSIS — F0281 Dementia in other diseases classified elsewhere with behavioral disturbance: Secondary | ICD-10-CM | POA: Diagnosis not present

## 2019-11-15 DIAGNOSIS — I130 Hypertensive heart and chronic kidney disease with heart failure and stage 1 through stage 4 chronic kidney disease, or unspecified chronic kidney disease: Secondary | ICD-10-CM | POA: Diagnosis not present

## 2019-11-15 DIAGNOSIS — I480 Paroxysmal atrial fibrillation: Secondary | ICD-10-CM

## 2019-11-15 NOTE — Progress Notes (Signed)
Location:  [facility] Room Number: 105A Place of Service:  SNF (31) Provider:  Dene Landsberg Otho Darner, NP   Virgie Dad, MD  Patient Care Team: Virgie Dad, MD as PCP - General (Internal Medicine) Jalayah Gutridge X, NP as Nurse Practitioner (Internal Medicine) [name], MD as Attending Physician (Neurology)  Extended Emergency Contact Information Primary Emergency Contact: Key,Caroline Address: [facility] [place] Phone: [phone] Mobile Phone: [phone] Relation: Daughter Secondary Emergency Contact: Key,Tim  [place] Mobile Phone: [phone] Relation: Son  Code Status:  DNR Goals of care: Advanced Directive information Advanced Directives 11/15/2019  Does Patient Have a Medical Advance Directive? Yes  Type of Paramedic of [place];Living will;Out of facility DNR (pink MOST or yellow form)  Does patient want to make changes to medical advance directive? No - Patient declined  Copy of [place] in Chart? Yes - validated most recent copy scanned in chart (See row information)  Would patient like information on creating a medical advance directive? -  Pre-existing out of facility DNR order (yellow form or pink MOST form) Yellow form placed in chart (order not valid for inpatient use);Pink MOST form placed in chart (order not valid for inpatient use)     Chief Complaint  Patient presents with  . Medical Management of Chronic Issues    Routing Visit     HPI:  Pt is a 86 y.o. female seen today for medical management of chronic diseases.    The patient resides in memory care unit [facility] for safety, care assistance, on Memantine for memory, her mood is stable on Quetiapine 12.5mg  bid. Hx of HTN, blood pressure is controlled on Carvedilol 6.25mg  bid, Lisinopril 30mg  qd. Hypothyroidism, stable, on Levothyroxine 43mcg qd.    Past Medical History:   Diagnosis Date  . Adenomatous colon polyp 07/17/97  . Allergic rhinitis   . Allergy   . Anxiety disorder   . Atrial fibrillation ([place])   . Chronic anticoagulation   . Diverticulosis   . History of shingles   . Hypercholesteremia   . Hypertension   . Hypothyroidism   . Internal hemorrhoids   . Melanoma ([place])   . Memory loss   . Meniere disease   . Tension headache    Past Surgical History:  Procedure Laterality Date  . CATARACT EXTRACTION Right 2012    Allergies  Allergen Reactions  . Amitriptyline     Urinary Retention  . Amlodipine Swelling  . Erythromycin Hives  . Nitrofurantoin Nausea Only  . Sudafed [Pseudoephedrine Hcl]     Unknown reaction per MAR   . Sulfa Antibiotics     "really ill"  . Tavist [Clemastine]     Urinary hesitancy  . Trimethoprim     Cramps  . Verapamil     Constipation    Outpatient Encounter Medications as of 11/15/2019  Medication Sig  . acetaminophen (TYLENOL) 325 MG tablet Take 650 mg by mouth every 6 (six) hours as needed.  Marland Kitchen atorvastatin (LIPITOR) 10 MG tablet Take 1 tablet (10 mg total) by mouth daily.  . calcium carbonate (CALCIUM 600) 600 MG TABS tablet Take 600 mg by mouth 2 (two) times daily with a meal.  . carvedilol (COREG) 6.25 MG tablet Take 1 tablet (6.25 mg total) by mouth 2 (two) times daily with a meal.  . feeding supplement (BOOST / RESOURCE BREEZE)  LIQD Take 1 Container by mouth at bedtime. Prefers Vanilla flavored  . hydrocortisone (ANUSOL-HC) 2.5 % rectal cream Place 1 application rectally 2 (two) times daily as needed for hemorrhoids.  Marland Kitchen levothyroxine (SYNTHROID, LEVOTHROID) 75 MCG tablet Take 75 mcg by mouth daily.  Marland Kitchen lisinopril (ZESTRIL) 30 MG tablet Take 30 mg by mouth daily.   . memantine (NAMENDA) 10 MG tablet Take 10 mg by mouth 2 (two) times daily.  . QUEtiapine (SEROQUEL) 25 MG tablet Take 12.5 mg by mouth 2 (two) times daily.    No facility-administered encounter medications on file as of 11/15/2019.    ROS was provided with assistance of staff.  Review of Systems  Constitutional: Negative for activity change, appetite change, chills, diaphoresis, fatigue, fever and unexpected weight change.  HENT: Positive for hearing loss. Negative for congestion and voice change.   Respiratory: Negative for cough and shortness of breath.   Cardiovascular: Negative for palpitations and leg swelling.  Gastrointestinal: Negative for abdominal distention, abdominal pain, constipation, nausea and vomiting.  Genitourinary: Negative for difficulty urinating, dysuria and urgency.  Musculoskeletal: Positive for gait problem.  Skin: Negative for color change.  Neurological: Negative for dizziness, speech difficulty, weakness and headaches.       Dementia  Psychiatric/Behavioral: Positive for confusion. Negative for agitation, behavioral problems, hallucinations and sleep disturbance. The patient is not nervous/anxious.     Immunization History  Administered Date(s) Administered  . Influenza Whole 06/30/2018  . Influenza, High Dose Seasonal PF 06/30/2019  . Influenza-Unspecified 07/19/2017  . PPD Test 09/05/2016  . Pneumococcal-Unspecified 08/06/2014  . Tetanus 07/13/2012   Pertinent  Health Maintenance Due  Topic Date Due  . PNA vac Low Risk Adult (2 of 2 - PCV13) 08/07/2015  . DEXA SCAN  07/23/2021 (Originally 04/28/1992)  . INFLUENZA VACCINE  Completed   Fall Risk  06/15/2018 10/14/2017 01/28/2016  Falls in the past year? Yes Yes No  Number falls in past yr: 1 1 -  Injury with Fall? Yes Yes -  Comment pelvic fracture - -  Risk for fall due to : Impaired balance/gait - -   Functional Status Survey:    Vitals:   11/15/19 1444  BP: (!) 150/70  Pulse: 78  Resp: 18  Temp: (!) 97.5 F (36.4 C)  TempSrc: Oral  SpO2: 99%  Weight: 127 lb 3.2 oz (57.7 kg)  Height: 5\' 1"  (1.549 m)   Body mass index is 24.03 kg/m. Physical Exam Vitals and nursing note reviewed.  Constitutional:      General: She  is not in acute distress.    Appearance: Normal appearance. She is not ill-appearing.  HENT:     Head: Normocephalic and atraumatic.     Nose: Nose normal.     Mouth/Throat:     Mouth: Mucous membranes are moist.  Eyes:     Extraocular Movements: Extraocular movements intact.     Conjunctiva/sclera: Conjunctivae normal.     Pupils: Pupils are equal, round, and reactive to light.  Cardiovascular:     Rate and Rhythm: Normal rate and regular rhythm.     Heart sounds: No murmur.  Pulmonary:     Breath sounds: No wheezing or rales.  Abdominal:     General: Bowel sounds are normal. There is no distension.     Palpations: Abdomen is soft.     Tenderness: There is no abdominal tenderness. There is no guarding or rebound.  Musculoskeletal:     Cervical back: Normal range of motion and neck  supple.     Right lower leg: No edema.     Left lower leg: No edema.  Skin:    General: Skin is warm and dry.  Neurological:     General: No focal deficit present.     Mental Status: She is alert. Mental status is at baseline.     Motor: No weakness.     Coordination: Coordination normal.     Gait: Gait abnormal.     Comments: Oriented to self.   Psychiatric:        Mood and Affect: Mood normal.     Comments: Pleasantly confused, followed simple directions during today's examination.      Labs reviewed: Recent Labs    01/12/19 0000 08/18/19 0000  NA 140 142  K 4.4 4.6  CL  --  105  CO2  --  27*  BUN 31* 29*  CREATININE 1.0 1.0  CALCIUM  --  9.6   Recent Labs    01/12/19 0000 08/18/19 0000  AST 14 15  ALT 10 11  ALKPHOS 63 79   Recent Labs    01/12/19 0000 08/18/19 0000  WBC 7.0 7.7  NEUTROABS  --  4,959  HGB 11.1* 11.8*  HCT 33* 35*  PLT 218 231   Lab Results  Component Value Date   TSH 1.28 08/18/2019   Lab Results  Component Value Date   HGBA1C 5.7 09/30/2017   Lab Results  Component Value Date   CHOL 150 10/17/2019   HDL 76 (A) 10/17/2019   LDLCALC 79  10/17/2019   TRIG 55 10/17/2019    Significant Diagnostic Results in last 30 days:   Assessment/Plan 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   Hypothyroidism Stable, TSH 1.28 08/17/19, continue Levothyroxine 96mcg qd.   Alzheimer's dementia with behavioral disturbance Functioning well in memory care unit [facility], continue Quetiapine for mood and behaviors.   Atrial fibrillation (HCC) Heart rate is in control.     Family/ staff Communication: plan of care reviewed with the patient and charge nurse.   Labs/tests ordered:  None  Time spend 25 minutes.

## 2019-11-15 NOTE — Assessment & Plan Note (Signed)
Stable, TSH 1.28 08/17/19, continue Levothyroxine 62mcg qd.

## 2019-11-15 NOTE — Assessment & Plan Note (Signed)
Heart rate is in control.  

## 2019-11-15 NOTE — Assessment & Plan Note (Signed)
Blood pressure is controlled, continue Carvedilol, Lisinopril.

## 2019-11-15 NOTE — Assessment & Plan Note (Addendum)
Functioning well in memory care unit [facility], continue Quetiapine for mood and behaviors.

## 2019-12-05 ENCOUNTER — Non-Acute Institutional Stay (SKILLED_NURSING_FACILITY): Payer: Medicare Other | Admitting: Internal Medicine

## 2019-12-05 ENCOUNTER — Encounter: Payer: Self-pay | Admitting: Internal Medicine

## 2019-12-05 DIAGNOSIS — I480 Paroxysmal atrial fibrillation: Secondary | ICD-10-CM

## 2019-12-05 DIAGNOSIS — G309 Alzheimer's disease, unspecified: Secondary | ICD-10-CM | POA: Diagnosis not present

## 2019-12-05 DIAGNOSIS — I10 Essential (primary) hypertension: Secondary | ICD-10-CM

## 2019-12-05 DIAGNOSIS — E782 Mixed hyperlipidemia: Secondary | ICD-10-CM

## 2019-12-05 DIAGNOSIS — E039 Hypothyroidism, unspecified: Secondary | ICD-10-CM | POA: Diagnosis not present

## 2019-12-05 DIAGNOSIS — F0281 Dementia in other diseases classified elsewhere with behavioral disturbance: Secondary | ICD-10-CM

## 2019-12-05 NOTE — Progress Notes (Signed)
Location:   [facility] Room Number: 105 Place of Service:  SNF [phone]) Provider:  Virgie Dad, MD  Virgie Dad, MD  Patient Care Team: Virgie Dad, MD as PCP - General (Internal Medicine) Mast, Man X, NP as Nurse Practitioner (Internal Medicine) [name], MD as Attending Physician (Neurology)  Extended Emergency Contact Information Primary Emergency Contact: Key,Caroline Address: [place] Phone: [phone] Mobile Phone: [phone] Relation: Daughter Secondary Emergency Contact: Key,Tim  [place] Mobile Phone: [phone] Relation: Son  Code Status:  DNR Goals of care: Advanced Directive information Advanced Directives 12/05/2019  Does Patient Have a Medical Advance Directive? Yes  Type of Paramedic of [place];Living will;Out of facility DNR (pink MOST or yellow form)  Does patient want to make changes to medical advance directive? No - Patient declined  Copy of [place] in Chart? Yes - validated most recent copy scanned in chart (See row information)  Would patient like information on creating a medical advance directive? -  Pre-existing out of facility DNR order (yellow form or pink MOST form) Yellow form placed in chart (order not valid for inpatient use);Pink MOST form placed in chart (order not valid for inpatient use)     Chief Complaint  Patient presents with  . Medical Management of Chronic Issues    Routine Visit     HPI:  Pt is a 86 y.o. female seen today for medical management of chronic diseases.   She has h/o Dementia with Behavior issues, Hypertension, Hypothyrodism, PAF not on Any anticoagulation, Hyperlipemia  She is Long term Resident of facility Does not have any New Issues Weight is stable No Nursing issues Patient gets very upset if ask questions. She has aphasia also. Very  Anxious Walks with No assist . No Falls  Past Medical History:  Diagnosis Date  . Adenomatous colon polyp 07/17/97  . Allergic rhinitis   . Allergy   . Anxiety disorder   . Atrial fibrillation ([place])   . Chronic anticoagulation   . Diverticulosis   . History of shingles   . Hypercholesteremia   . Hypertension   . Hypothyroidism   . Internal hemorrhoids   . Melanoma ([place])   . Memory loss   . Meniere disease   . Tension headache    Past Surgical History:  Procedure Laterality Date  . CATARACT EXTRACTION Right 2012    Allergies  Allergen Reactions  . Amitriptyline     Urinary Retention  . Amlodipine Swelling  . Erythromycin Hives  . Nitrofurantoin Nausea Only  . Sudafed [Pseudoephedrine Hcl]     Unknown reaction per MAR   . Sulfa Antibiotics     "really ill"  . Tavist [Clemastine]     Urinary hesitancy  . Trimethoprim     Cramps  . Verapamil     Constipation    Allergies as of 12/05/2019      Reactions   Amitriptyline    Urinary Retention   Amlodipine Swelling   Erythromycin Hives   Nitrofurantoin Nausea Only   Sudafed [pseudoephedrine Hcl]    Unknown reaction per MAR    Sulfa Antibiotics    "really ill"   Tavist [clemastine]    Urinary hesitancy   Trimethoprim    Cramps   Verapamil    Constipation      Medication List  Accurate as of December 05, 2019 11:08 AM. If you have any questions, ask your nurse or doctor.        acetaminophen 325 MG tablet Commonly known as: TYLENOL Take 650 mg by mouth every 6 (six) hours as needed.   atorvastatin 10 MG tablet Commonly known as: LIPITOR Take 1 tablet (10 mg total) by mouth daily.   Calcium 600 600 MG Tabs tablet Generic drug: calcium carbonate Take 600 mg by mouth 2 (two) times daily with a meal.   carvedilol 6.25 MG tablet Commonly known as: COREG Take 1 tablet (6.25 mg total) by mouth 2 (two) times daily with a meal.   feeding supplement Liqd Take 1 Container by mouth at bedtime. Prefers  Vanilla flavored   hydrocortisone 2.5 % rectal cream Commonly known as: ANUSOL-HC Place 1 application rectally 2 (two) times daily as needed for hemorrhoids.   levothyroxine 75 MCG tablet Commonly known as: SYNTHROID Take 75 mcg by mouth daily.   lisinopril 30 MG tablet Commonly known as: ZESTRIL Take 30 mg by mouth daily.   memantine 10 MG tablet Commonly known as: NAMENDA Take 10 mg by mouth 2 (two) times daily.   SEROquel 25 MG tablet Generic drug: QUEtiapine Take 12.5 mg by mouth 2 (two) times daily.       Review of Systems  Unable to perform ROS: Dementia    Immunization History  Administered Date(s) Administered  . Influenza Whole 06/30/2018  . Influenza, High Dose Seasonal PF 06/30/2019  . Influenza-Unspecified 07/19/2017  . PPD Test 09/05/2016  . Pneumococcal Conjugate-13 08/06/2014  . Pneumococcal Polysaccharide-23 06/17/2004  . Tetanus 07/13/2012   Pertinent  Health Maintenance Due  Topic Date Due  . DEXA SCAN  07/23/2021 (Originally 04/28/1992)  . INFLUENZA VACCINE  Completed  . PNA vac Low Risk Adult  Completed   Fall Risk  06/15/2018 10/14/2017 01/28/2016  Falls in the past year? Yes Yes No  Number falls in past yr: 1 1 -  Injury with Fall? Yes Yes -  Comment pelvic fracture - -  Risk for fall due to : Impaired balance/gait - -   Functional Status Survey:    Vitals:   12/05/19 1103  BP: 138/66  Pulse: 84  Resp: 18  Temp: 98 F (36.7 C)  SpO2: 96%  Weight: 126 lb 12.8 oz (57.5 kg)  Height: 5\' 1"  (1.549 m)   Body mass index is 23.96 kg/m. Physical Exam Constitutional: . Well-developed and well-nourished.  HENT:  Head: Normocephalic.  Mouth/Throat: Oropharynx is clear and moist.  Eyes: Pupils are equal, round, and reactive to light.  Neck: Neck supple.  Cardiovascular: Normal rate and normal heart sounds.  No murmur heard. Pulmonary/Chest: Effort normal and breath sounds normal. No respiratory distress. No wheezes. She has no rales.   Abdominal: Soft. Bowel sounds are normal. No distension. There is no tenderness. There is no rebound.  Musculoskeletal: No edema.  Lymphadenopathy: none Neurological: No Focal deficits Skin: Skin is warm and dry.  Psychiatric: Normal mood and affect. Behavior is normal. Thought content normal.   Labs reviewed: Recent Labs    01/12/19 0000 08/18/19 0000  NA 140 142  K 4.4 4.6  CL  --  105  CO2  --  27*  BUN 31* 29*  CREATININE 1.0 1.0  CALCIUM  --  9.6   Recent Labs    01/12/19 0000 08/18/19 0000  AST 14 15  ALT 10 11  ALKPHOS 63 79   Recent Labs  01/12/19 0000 08/18/19 0000  WBC 7.0 7.7  NEUTROABS  --  4,959  HGB 11.1* 11.8*  HCT 33* 35*  PLT 218 231   Lab Results  Component Value Date   TSH 1.28 08/18/2019   Lab Results  Component Value Date   HGBA1C 5.7 09/30/2017   Lab Results  Component Value Date   CHOL 150 10/17/2019   HDL 76 (A) 10/17/2019   LDLCALC 79 10/17/2019   TRIG 55 10/17/2019    Significant Diagnostic Results in last 30 days:  No results found.  Assessment/Plan  Essential hypertension No Coreg and Lisinopril  Paroxysmal atrial fibrillation On Coreg No Anticoagulation due to falls Acquired hypothyroidism TSH Normal in 11/20  Late onset Alzheimer's disease with behavioral disturbance On Seroquel and Namenda Anemia, Hgb Stable Hyperlipidemia On Statin LDL 76 Family/ staff Communication:  Labs/tests ordered:    Total time spent in this patient care encounter was  25_  minutes; greater than 50% of the visit spent counseling patient and staff, reviewing records , Labs and coordinating care for problems addressed at this encounter.

## 2019-12-06 DIAGNOSIS — Z20828 Contact with and (suspected) exposure to other viral communicable diseases: Secondary | ICD-10-CM | POA: Diagnosis not present

## 2019-12-07 DIAGNOSIS — I10 Essential (primary) hypertension: Secondary | ICD-10-CM | POA: Diagnosis not present

## 2019-12-13 DIAGNOSIS — Z20828 Contact with and (suspected) exposure to other viral communicable diseases: Secondary | ICD-10-CM | POA: Diagnosis not present

## 2019-12-14 DIAGNOSIS — I10 Essential (primary) hypertension: Secondary | ICD-10-CM | POA: Diagnosis not present

## 2019-12-14 LAB — BASIC METABOLIC PANEL
BUN: 36 — AB (ref 4–21)
CO2: 30 — AB (ref 13–22)
Chloride: 105 (ref 99–108)
Creatinine: 1.1 (ref 0.5–1.1)
Glucose: 99
Potassium: 4.5 (ref 3.4–5.3)
Sodium: 141 (ref 137–147)

## 2019-12-14 LAB — COMPREHENSIVE METABOLIC PANEL
Calcium: 9.6 (ref 8.7–10.7)
GFR calc Af Amer: 48
GFR calc non Af Amer: 42

## 2020-01-10 ENCOUNTER — Non-Acute Institutional Stay (SKILLED_NURSING_FACILITY): Payer: Medicare Other | Admitting: Nurse Practitioner

## 2020-01-10 ENCOUNTER — Encounter: Payer: Self-pay | Admitting: Nurse Practitioner

## 2020-01-10 DIAGNOSIS — I480 Paroxysmal atrial fibrillation: Secondary | ICD-10-CM

## 2020-01-10 DIAGNOSIS — E039 Hypothyroidism, unspecified: Secondary | ICD-10-CM

## 2020-01-10 DIAGNOSIS — I130 Hypertensive heart and chronic kidney disease with heart failure and stage 1 through stage 4 chronic kidney disease, or unspecified chronic kidney disease: Secondary | ICD-10-CM

## 2020-01-10 DIAGNOSIS — F0281 Dementia in other diseases classified elsewhere with behavioral disturbance: Secondary | ICD-10-CM

## 2020-01-10 DIAGNOSIS — G309 Alzheimer's disease, unspecified: Secondary | ICD-10-CM | POA: Diagnosis not present

## 2020-01-10 DIAGNOSIS — N183 Chronic kidney disease, stage 3 unspecified: Secondary | ICD-10-CM

## 2020-01-10 NOTE — Assessment & Plan Note (Addendum)
Stable, continue memory care unit [facility] for safety, care assistance, continue Memantine for memory, Quetiapine for mood/behaviors.

## 2020-01-10 NOTE — Assessment & Plan Note (Signed)
Blood pressure is controlled, continue Carvedilol, Lisinopril.

## 2020-01-10 NOTE — Assessment & Plan Note (Signed)
Stable, TSH 1.28 08/17/19, continue Levothyroxine 51mcg qd.

## 2020-01-10 NOTE — Assessment & Plan Note (Signed)
Heart rate is in control.  

## 2020-01-10 NOTE — Progress Notes (Signed)
Location:   [place] Room Number: 105 Place of Service:  SNF (31) Provider:  Amada Hallisey, NP   Patient Care Team: Virgie Dad, MD as PCP - General (Internal Medicine) Ivon Roedel X, NP as Nurse Practitioner (Internal Medicine) [name], MD as Attending Physician (Neurology)  Extended Emergency Contact Information Primary Emergency Contact: Key,Caroline Address: [place] Phone: [phone] Mobile Phone: [phone] Relation: Daughter Secondary Emergency Contact: Key,Tim  [place] Mobile Phone: [phone] Relation: Son  Code Status:  DNR Goals of care: Advanced Directive information Advanced Directives 01/10/2020  Does Patient Have a Medical Advance Directive? Yes  Type of Advance Directive Living will;Out of facility DNR (pink MOST or yellow form);Healthcare Power of Attorney  Does patient want to make changes to medical advance directive? No - Patient declined  Copy of [place] in Chart? Yes - validated most recent copy scanned in chart (See row information)  Would patient like information on creating a medical advance directive? -  Pre-existing out of facility DNR order (yellow form or pink MOST form) Yellow form placed in chart (order not valid for inpatient use);Pink MOST form placed in chart (order not valid for inpatient use)     Chief Complaint  Patient presents with  . Medical Management of Chronic Issues    Routine Visit    HPI:  Pt is a 86 y.o. female seen today for medical management of chronic diseases.    The patient resides in memory care unit [facility] for safety, care assistance, ambulates with walker, on Memantine for memory, her mood is stable, on Quetiapine 12.5mg  bid. HTN, blood pressure is controlled, on Lisinopril 30mg  qd, Varvedilol 6.25mg  bid. Hypothyroidism, stable, taking Levothyroxine 30mcg qd. Afib, heart rate is in  control.    Past Medical History:  Diagnosis Date  . Adenomatous colon polyp 07/17/97  . Allergic rhinitis   . Allergy   . Anxiety disorder   . Atrial fibrillation ([place])   . Chronic anticoagulation   . Diverticulosis   . History of shingles   . Hypercholesteremia   . Hypertension   . Hypothyroidism   . Internal hemorrhoids   . Melanoma ([place])   . Memory loss   . Meniere disease   . Tension headache    Past Surgical History:  Procedure Laterality Date  . CATARACT EXTRACTION Right 2012    Allergies  Allergen Reactions  . Amitriptyline     Urinary Retention  . Amlodipine Swelling  . Erythromycin Hives  . Nitrofuran Derivatives   . Nitrofurantoin Nausea Only  . Pseudoephedrine Hcl   . Sudafed [Pseudoephedrine Hcl]     Unknown reaction per MAR   . Sulfa Antibiotics     "really ill"  . Tavist [Clemastine]     Urinary hesitancy  . Trimethoprim     Cramps  . Verapamil     Constipation    Allergies as of 01/10/2020      Reactions   Amitriptyline    Urinary Retention   Amlodipine Swelling   Erythromycin Hives   Nitrofuran Derivatives    Nitrofurantoin Nausea Only   Pseudoephedrine Hcl    Sudafed [pseudoephedrine Hcl]    Unknown reaction per MAR    Sulfa Antibiotics    "really ill"   Tavist [clemastine]    Urinary hesitancy   Trimethoprim    Cramps   Verapamil  Constipation      Medication List       Accurate as of January 10, 2020 11:59 PM. If you have any questions, ask your nurse or doctor.        acetaminophen 325 MG tablet Commonly known as: TYLENOL Take 650 mg by mouth every 6 (six) hours as needed.   atorvastatin 10 MG tablet Commonly known as: LIPITOR Take 1 tablet (10 mg total) by mouth daily.   Calcium 600 600 MG Tabs tablet Generic drug: calcium carbonate Take 600 mg by mouth 2 (two) times daily with a meal.   carvedilol 6.25 MG tablet Commonly known as: COREG Take 1 tablet (6.25 mg total) by mouth 2 (two) times daily with a  meal.   feeding supplement Liqd Take 1 Container by mouth at bedtime. Prefers Vanilla flavored   hydrocortisone 2.5 % rectal cream Commonly known as: ANUSOL-HC Place 1 application rectally 2 (two) times daily as needed for hemorrhoids.   levothyroxine 75 MCG tablet Commonly known as: SYNTHROID Take 75 mcg by mouth daily.   lisinopril 30 MG tablet Commonly known as: ZESTRIL Take 30 mg by mouth daily.   memantine 10 MG tablet Commonly known as: NAMENDA Take 10 mg by mouth 2 (two) times daily.   SEROquel 25 MG tablet Generic drug: QUEtiapine Take 12.5 mg by mouth 2 (two) times daily.       Review of Systems  Constitutional: Negative for activity change, appetite change, chills, fever and unexpected weight change.  HENT: Positive for hearing loss. Negative for congestion and voice change.   Eyes: Negative for visual disturbance.  Respiratory: Negative for shortness of breath.   Cardiovascular: Negative for leg swelling.  Gastrointestinal: Negative for abdominal distention, abdominal pain and constipation.  Genitourinary: Negative for difficulty urinating, dysuria and urgency.  Musculoskeletal: Positive for gait problem.  Skin: Negative for color change.  Neurological: Negative for speech difficulty, weakness and light-headedness.       Dementia  Psychiatric/Behavioral: Positive for confusion. Negative for agitation, behavioral problems and sleep disturbance.    Immunization History  Administered Date(s) Administered  . Influenza Whole 06/30/2018  . Influenza, High Dose Seasonal PF 06/30/2019  . Influenza-Unspecified 07/19/2017  . Moderna SARS-COVID-2 Vaccination 09/30/2019, 10/28/2019  . PPD Test 09/05/2016  . Pneumococcal Conjugate-13 08/06/2014  . Pneumococcal Polysaccharide-23 06/17/2004  . Tetanus 07/13/2012   Pertinent  Health Maintenance Due  Topic Date Due  . DEXA SCAN  07/23/2021 (Originally 04/28/1992)  . INFLUENZA VACCINE  04/28/2020  . PNA vac Low Risk  Adult  Completed   Fall Risk  06/15/2018 10/14/2017 01/28/2016  Falls in the past year? Yes Yes No  Number falls in past yr: 1 1 -  Injury with Fall? Yes Yes -  Comment pelvic fracture - -  Risk for fall due to : Impaired balance/gait - -   Functional Status Survey:    Vitals:   01/10/20 1414  BP: 130/62  Pulse: 70  Resp: 18  Temp: (!) 97.1 F (36.2 C)  SpO2: 95%  Weight: 126 lb 3.2 oz (57.2 kg)  Height: 5\' 1"  (1.549 m)   Body mass index is 23.85 kg/m. Physical Exam Vitals and nursing note reviewed.  Constitutional:      Appearance: Normal appearance.  HENT:     Head: Normocephalic and atraumatic.     Mouth/Throat:     Mouth: Mucous membranes are moist.  Eyes:     Extraocular Movements: Extraocular movements intact.     Conjunctiva/sclera: Conjunctivae normal.  Pupils: Pupils are equal, round, and reactive to light.  Cardiovascular:     Rate and Rhythm: Normal rate and regular rhythm.     Heart sounds: No murmur.  Pulmonary:     Breath sounds: No rales.  Abdominal:     General: Bowel sounds are normal. There is no distension.     Palpations: Abdomen is soft.     Tenderness: There is no abdominal tenderness.  Musculoskeletal:     Cervical back: Normal range of motion and neck supple.     Right lower leg: No edema.     Left lower leg: No edema.  Skin:    General: Skin is warm and dry.  Neurological:     General: No focal deficit present.     Mental Status: She is alert. Mental status is at baseline.     Gait: Gait abnormal.     Comments: Oriented to self.   Psychiatric:        Mood and Affect: Mood normal.     Comments: confused, followed simple directions during today's examination.      Labs reviewed: Recent Labs    01/12/19 0000 08/18/19 0000 12/14/19 0000  NA 140 142 141  K 4.4 4.6 4.5  CL  --  105 105  CO2  --  27* 30*  BUN 31* 29* 36*  CREATININE 1.0 1.0 1.1  CALCIUM  --  9.6 9.6   Recent Labs    01/12/19 0000 08/18/19 0000  AST 14  15  ALT 10 11  ALKPHOS 63 79   Recent Labs    01/12/19 0000 08/18/19 0000  WBC 7.0 7.7  NEUTROABS  --  4,959  HGB 11.1* 11.8*  HCT 33* 35*  PLT 218 231   Lab Results  Component Value Date   TSH 1.28 08/18/2019   Lab Results  Component Value Date   HGBA1C 5.7 09/30/2017   Lab Results  Component Value Date   CHOL 150 10/17/2019   HDL 76 (A) 10/17/2019   LDLCALC 79 10/17/2019   TRIG 55 10/17/2019    Significant Diagnostic Results in last 30 days:  No results found.  Assessment/Plan Atrial fibrillation (HCC) Heart rate is in control.   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   Hypothyroidism Stable, TSH 1.28 08/17/19, continue Levothyroxine 34mcg qd.   Alzheimer's dementia with behavioral disturbance Stable, continue memory care unit [facility] for safety, care assistance, continue Memantine for memory, Quetiapine for mood/behaviors.      Family/ staff Communication: plan of care reviewed with the patient and charge nurse.   Labs/tests ordered:  none  Time spend 25 minutes.

## 2020-01-22 ENCOUNTER — Non-Acute Institutional Stay (SKILLED_NURSING_FACILITY): Payer: Medicare Other | Admitting: Nurse Practitioner

## 2020-01-22 ENCOUNTER — Encounter: Payer: Self-pay | Admitting: Nurse Practitioner

## 2020-01-22 DIAGNOSIS — I130 Hypertensive heart and chronic kidney disease with heart failure and stage 1 through stage 4 chronic kidney disease, or unspecified chronic kidney disease: Secondary | ICD-10-CM

## 2020-01-22 DIAGNOSIS — G309 Alzheimer's disease, unspecified: Secondary | ICD-10-CM

## 2020-01-22 DIAGNOSIS — I480 Paroxysmal atrial fibrillation: Secondary | ICD-10-CM

## 2020-01-22 DIAGNOSIS — F0281 Dementia in other diseases classified elsewhere with behavioral disturbance: Secondary | ICD-10-CM | POA: Diagnosis not present

## 2020-01-22 DIAGNOSIS — R21 Rash and other nonspecific skin eruption: Secondary | ICD-10-CM | POA: Diagnosis not present

## 2020-01-22 DIAGNOSIS — N183 Chronic kidney disease, stage 3 unspecified: Secondary | ICD-10-CM

## 2020-01-22 NOTE — Assessment & Plan Note (Addendum)
The left anterior forearm a half band area, mild redness, itchy, and  swollen, scabbed, slightly warm, likely irritation from her multiple bracelets on the left wrist, the patient denied pain. Will apply 0.5% triamcinolone cream bid x 2 weeks.

## 2020-01-22 NOTE — Progress Notes (Signed)
Location:   [place] Room Number: 105 Place of Service:  SNF (31) Provider:  Marda Stalker, Lennie Odor NP   Virgie Dad, MD  Patient Care Team: Virgie Dad, MD as PCP - General (Internal Medicine) Kennie Karapetian X, NP as Nurse Practitioner (Internal Medicine) [name], MD as Attending Physician (Neurology)  Extended Emergency Contact Information Primary Emergency Contact: Key,Caroline Address: [place] Phone: [phone] Mobile Phone: [phone] Relation: Daughter Secondary Emergency Contact: Key,Tim  [place] Mobile Phone: [phone] Relation: Son  Code Status:  DNR Goals of care: Advanced Directive information Advanced Directives 01/10/2020  Does Patient Have a Medical Advance Directive? Yes  Type of Advance Directive Living will;Out of facility DNR (pink MOST or yellow form);Healthcare Power of Attorney  Does patient want to make changes to medical advance directive? No - Patient declined  Copy of [place] in Chart? Yes - validated most recent copy scanned in chart (See row information)  Would patient like information on creating a medical advance directive? -  Pre-existing out of facility DNR order (yellow form or pink MOST form) Yellow form placed in chart (order not valid for inpatient use);Pink MOST form placed in chart (order not valid for inpatient use)     Chief Complaint  Patient presents with  . Acute Visit    left forearm red, itchy area    HPI:  Pt is a 86 y.o. female seen today for an acute visit for reported the patient's left anterior forearm band area, mild redness, itchy, and  swollen, scabbed, slightly warm, , the patient denied pain, onset and duration are uncertain. The patient resides in memory care unit [facility] for safety, care assistance, wears multiple bracelets on her left wrist, taking memantine for memory. Hx of HTN, blood  pressure is controlled, on Carvedilol 6.25mg  bid, Lisinopril 30mg  qd. Her mood is stable, on Quetiapine 12.5mg  bid. Afib, heart rate is in control, on Carvedilol.    Past Medical History:  Diagnosis Date  . Adenomatous colon polyp 07/17/97  . Allergic rhinitis   . Allergy   . Anxiety disorder   . Atrial fibrillation ([place])   . Chronic anticoagulation   . Diverticulosis   . History of shingles   . Hypercholesteremia   . Hypertension   . Hypothyroidism   . Internal hemorrhoids   . Melanoma ([place])   . Memory loss   . Meniere disease   . Tension headache    Past Surgical History:  Procedure Laterality Date  . CATARACT EXTRACTION Right 2012    Allergies  Allergen Reactions  . Amitriptyline     Urinary Retention  . Amlodipine Swelling  . Erythromycin Hives  . Nitrofuran Derivatives   . Nitrofurantoin Nausea Only  . Pseudoephedrine Hcl   . Sudafed [Pseudoephedrine Hcl]     Unknown reaction per MAR   . Sulfa Antibiotics     "really ill"  . Tavist [Clemastine]     Urinary hesitancy  . Trimethoprim     Cramps  . Verapamil     Constipation    Allergies as of 01/22/2020      Reactions   Amitriptyline    Urinary Retention   Amlodipine Swelling   Erythromycin Hives   Nitrofuran Derivatives    Nitrofurantoin Nausea Only   Pseudoephedrine Hcl    Sudafed [pseudoephedrine Hcl]    Unknown reaction per [facility]  Sulfa Antibiotics    "really ill"   Tavist [clemastine]    Urinary hesitancy   Trimethoprim    Cramps   Verapamil    Constipation      Medication List       Accurate as of January 22, 2020 11:59 PM. If you have any questions, ask your nurse or doctor.        acetaminophen 325 MG tablet Commonly known as: TYLENOL Take 650 mg by mouth every 6 (six) hours as needed.   atorvastatin 10 MG tablet Commonly known as: LIPITOR Take 1 tablet (10 mg total) by mouth daily.   Calcium 600 600 MG Tabs tablet Generic drug: calcium carbonate Take 600 mg by mouth 2  (two) times daily with a meal.   carvedilol 6.25 MG tablet Commonly known as: COREG Take 1 tablet (6.25 mg total) by mouth 2 (two) times daily with a meal.   feeding supplement Liqd Take 1 Container by mouth at bedtime. Prefers Vanilla flavored   hydrocortisone 2.5 % rectal cream Commonly known as: ANUSOL-HC Place 1 application rectally 2 (two) times daily as needed for hemorrhoids.   levothyroxine 75 MCG tablet Commonly known as: SYNTHROID Take 75 mcg by mouth daily.   lisinopril 30 MG tablet Commonly known as: ZESTRIL Take 30 mg by mouth daily.   memantine 10 MG tablet Commonly known as: NAMENDA Take 10 mg by mouth 2 (two) times daily.   SEROquel 25 MG tablet Generic drug: QUEtiapine Take 12.5 mg by mouth 2 (two) times daily.   triamcinolone cream 0.5 % Commonly known as: KENALOG Apply 1 application topically 2 (two) times daily.       Review of Systems  Constitutional: Negative for activity change and appetite change.  HENT: Positive for hearing loss. Negative for congestion and voice change.   Eyes: Negative for visual disturbance.  Respiratory: Negative for cough.   Cardiovascular: Negative for leg swelling.  Gastrointestinal: Negative for abdominal pain and constipation.  Genitourinary: Negative for dysuria and urgency.  Musculoskeletal: Positive for gait problem.  Skin: Positive for rash. Negative for color change.  Neurological: Negative for dizziness and speech difficulty.       Dementia  Psychiatric/Behavioral: Positive for confusion. Negative for agitation, behavioral problems and sleep disturbance.    Immunization History  Administered Date(s) Administered  . Influenza Whole 06/30/2018  . Influenza, High Dose Seasonal PF 06/30/2019  . Influenza-Unspecified 07/19/2017  . Moderna SARS-COVID-2 Vaccination 09/30/2019, 10/28/2019  . PPD Test 09/05/2016  . Pneumococcal Conjugate-13 08/06/2014  . Pneumococcal Polysaccharide-23 06/17/2004  . Tetanus  07/13/2012   Pertinent  Health Maintenance Due  Topic Date Due  . DEXA SCAN  07/23/2021 (Originally 04/28/1992)  . INFLUENZA VACCINE  04/28/2020  . PNA vac Low Risk Adult  Completed   Fall Risk  06/15/2018 10/14/2017 01/28/2016  Falls in the past year? Yes Yes No  Number falls in past yr: 1 1 -  Injury with Fall? Yes Yes -  Comment pelvic fracture - -  Risk for fall due to : Impaired balance/gait - -   Functional Status Survey:    Vitals:   01/22/20 1550  BP: 116/68  Pulse: 70  Resp: 18  Temp: (!) 97.2 F (36.2 C)  SpO2: 97%  Weight: 126 lb 3.2 oz (57.2 kg)  Height: 5\' 1"  (1.549 m)   Body mass index is 23.85 kg/m. Physical Exam Vitals and nursing note reviewed.  Constitutional:      Appearance: Normal appearance.  HENT:  Head: Normocephalic and atraumatic.     Mouth/Throat:     Mouth: Mucous membranes are moist.  Eyes:     Extraocular Movements: Extraocular movements intact.     Conjunctiva/sclera: Conjunctivae normal.     Pupils: Pupils are equal, round, and reactive to light.  Cardiovascular:     Rate and Rhythm: Normal rate and regular rhythm.     Heart sounds: No murmur.  Pulmonary:     Breath sounds: No rales.  Abdominal:     General: Bowel sounds are normal.     Palpations: Abdomen is soft.     Tenderness: There is no abdominal tenderness.  Musculoskeletal:     Cervical back: Normal range of motion and neck supple.     Right lower leg: No edema.     Left lower leg: No edema.  Skin:    General: Skin is warm and dry.     Findings: Rash present.     Comments: The  left anterior forearm a half band shape area, mild redness, itchy, and  swollen, scabbed, slightly warm  Neurological:     General: No focal deficit present.     Mental Status: She is alert. Mental status is at baseline.     Gait: Gait abnormal.     Comments: Oriented to self.   Psychiatric:        Mood and Affect: Mood normal.     Comments: confused, pleasant     Labs reviewed: Recent  Labs    08/18/19 0000 12/14/19 0000  NA 142 141  K 4.6 4.5  CL 105 105  CO2 27* 30*  BUN 29* 36*  CREATININE 1.0 1.1  CALCIUM 9.6 9.6   Recent Labs    08/18/19 0000  AST 15  ALT 11  ALKPHOS 79   Recent Labs    08/18/19 0000  WBC 7.7  NEUTROABS 4,959  HGB 11.8*  HCT 35*  PLT 231   Lab Results  Component Value Date   TSH 1.28 08/18/2019   Lab Results  Component Value Date   HGBA1C 5.7 09/30/2017   Lab Results  Component Value Date   CHOL 150 10/17/2019   HDL 76 (A) 10/17/2019   LDLCALC 79 10/17/2019   TRIG 55 10/17/2019    Significant Diagnostic Results in last 30 days:  No results found.  Assessment/Plan Rash The left anterior forearm a half band area, mild redness, itchy, and  swollen, scabbed, slightly warm, likely irritation from her multiple bracelets on the left wrist, the patient denied pain. Will apply 0.5% triamcinolone cream bid x 2 weeks.   Alzheimer's dementia with behavioral disturbance Continue memory care unit [facility] for safety, care assistance, continue Memantine for memory, Quetiapine for mood/behaviors.   Atrial fibrillation (HCC) Heart rate is in control, continue Carvedilol.   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      Family/ staff Communication: plan of care reviewed with the patient and charge nurse.   Labs/tests ordered:  none  Time spend 25 minutes.

## 2020-01-23 ENCOUNTER — Encounter: Payer: Self-pay | Admitting: Nurse Practitioner

## 2020-01-23 NOTE — Assessment & Plan Note (Addendum)
Continue memory care unit [facility] for safety, care assistance, continue Memantine for memory, Quetiapine for mood/behaviors.

## 2020-01-23 NOTE — Assessment & Plan Note (Signed)
Heart rate is in control, continue Carvedilol.

## 2020-01-23 NOTE — Assessment & Plan Note (Signed)
Blood pressure is controlled, continue Carvedilol, Lisinopril.

## 2020-01-31 ENCOUNTER — Encounter: Payer: Self-pay | Admitting: Nurse Practitioner

## 2020-01-31 ENCOUNTER — Non-Acute Institutional Stay (SKILLED_NURSING_FACILITY): Payer: Medicare Other | Admitting: Nurse Practitioner

## 2020-01-31 DIAGNOSIS — G309 Alzheimer's disease, unspecified: Secondary | ICD-10-CM | POA: Diagnosis not present

## 2020-01-31 DIAGNOSIS — E039 Hypothyroidism, unspecified: Secondary | ICD-10-CM | POA: Diagnosis not present

## 2020-01-31 DIAGNOSIS — F0281 Dementia in other diseases classified elsewhere with behavioral disturbance: Secondary | ICD-10-CM

## 2020-01-31 DIAGNOSIS — N183 Chronic kidney disease, stage 3 unspecified: Secondary | ICD-10-CM

## 2020-01-31 DIAGNOSIS — I130 Hypertensive heart and chronic kidney disease with heart failure and stage 1 through stage 4 chronic kidney disease, or unspecified chronic kidney disease: Secondary | ICD-10-CM | POA: Diagnosis not present

## 2020-01-31 NOTE — Assessment & Plan Note (Signed)
Stable, continue Levothyroxine 75mcg qd, TSH 1.28 08/17/19 

## 2020-01-31 NOTE — Assessment & Plan Note (Addendum)
Stable, continue Lisinopril, Carvedilol for blood pressure, eGFR 38, Bun 41, creat 1.22 12/07/19, repeated 12/14/19 Na 141, K 4.5, Bun 36, creat 1.1, eGFR 42, observe.

## 2020-01-31 NOTE — Assessment & Plan Note (Signed)
Continue Memory care unit, [facility] for safety, care assistance, continue Memantine for memory, Quetiapine for mood/behaviors.

## 2020-01-31 NOTE — Progress Notes (Signed)
Location:   [facility] Room Number: 105-A Place of Service:  SNF (31) Provider: [facility] Myeesha Shane NP  Virgie Dad, MD  Patient Care Team: Virgie Dad, MD as PCP - General (Internal Medicine) Braidan Ricciardi X, NP as Nurse Practitioner (Internal Medicine) [name], MD as Attending Physician (Neurology)  Extended Emergency Contact Information Primary Emergency Contact: Key,Caroline Address: [place] Phone: [phone] Mobile Phone: [phone] Relation: Daughter Secondary Emergency Contact: Key,Tim  [place] Mobile Phone: [phone] Relation: Son  Code Status:  DNR Goals of care: Advanced Directive information Advanced Directives 01/10/2020  Does Patient Have a Medical Advance Directive? Yes  Type of Advance Directive Living will;Out of facility DNR (pink MOST or yellow form);Healthcare Power of Attorney  Does patient want to make changes to medical advance directive? No - Patient declined  Copy of [place] in Chart? Yes - validated most recent copy scanned in chart (See row information)  Would patient like information on creating a medical advance directive? -  Pre-existing out of facility DNR order (yellow form or pink MOST form) Yellow form placed in chart (order not valid for inpatient use);Pink MOST form placed in chart (order not valid for inpatient use)     Chief Complaint  Patient presents with  . Medical Management of Chronic Issues    Routine [facility] SN[facility] visit    HPI:  Pt is a 86 y.o. female seen today for medical management of chronic diseases.  The patient resides in memory care unit [facility] for safety, and are assistance, ambulates with walker, on Memantine to preserve memory. Hx of HTN, blood pressure is controlled on Lisinopril 79m qd, Carvedilol 6.282mbid, her mood is stable, on Quetiapine 12.65m50mid. Hypothyroidism, on Levothyroxine  765m60md.    Past Medical History:  Diagnosis Date  . Adenomatous colon polyp 07/17/97  . Allergic rhinitis   . Allergy   . Anxiety disorder   . Atrial fibrillation (HCC)[place]. Chronic anticoagulation   . Diverticulosis   . History of shingles   . Hypercholesteremia   . Hypertension   . Hypothyroidism   . Internal hemorrhoids   . Melanoma (HCC)[place]. Memory loss   . Meniere disease   . Tension headache    Past Surgical History:  Procedure Laterality Date  . CATARACT EXTRACTION Right 2012    Allergies  Allergen Reactions  . Amitriptyline     Urinary Retention  . Amlodipine Swelling  . Erythromycin Hives  . Nitrofuran Derivatives   . Nitrofurantoin Nausea Only  . Pseudoephedrine Hcl   . Sudafed [Pseudoephedrine Hcl]     Unknown reaction per MAR   . Sulfa Antibiotics     "really ill"  . Tavist [Clemastine]     Urinary hesitancy  . Trimethoprim     Cramps  . Verapamil     Constipation    Allergies as of 01/31/2020      Reactions   Amitriptyline    Urinary Retention   Amlodipine Swelling   Erythromycin Hives   Nitrofuran Derivatives    Nitrofurantoin Nausea Only   Pseudoephedrine Hcl    Sudafed [pseudoephedrine Hcl]    Unknown reaction per MAR    Sulfa Antibiotics    "really ill"   Tavist [clemastine]    Urinary hesitancy   Trimethoprim    Cramps   Verapamil  Constipation      Medication List       Accurate as of Jan 31, 2020 11:59 PM. If you have any questions, ask your nurse or doctor.        acetaminophen 325 MG tablet Commonly known as: TYLENOL Take 650 mg by mouth every 6 (six) hours as needed.   atorvastatin 10 MG tablet Commonly known as: LIPITOR Take 1 tablet (10 mg total) by mouth daily.   Calcium 600 600 MG Tabs tablet Generic drug: calcium carbonate Take 600 mg by mouth 2 (two) times daily with a meal.   carvedilol 6.25 MG tablet Commonly known as: COREG Take 1 tablet (6.25 mg total) by mouth 2 (two) times daily with a meal.    feeding supplement Liqd Take 1 Container by mouth at bedtime. Prefers Vanilla flavored   hydrocortisone 2.5 % rectal cream Commonly known as: ANUSOL-HC Place 1 application rectally 2 (two) times daily as needed for hemorrhoids.   levothyroxine 75 MCG tablet Commonly known as: SYNTHROID Take 75 mcg by mouth daily.   lisinopril 30 MG tablet Commonly known as: ZESTRIL Take 30 mg by mouth daily.   memantine 10 MG tablet Commonly known as: NAMENDA Take 10 mg by mouth 2 (two) times daily.   SEROquel 25 MG tablet Generic drug: QUEtiapine Take 12.5 mg by mouth 2 (two) times daily.   triamcinolone cream 0.5 % Commonly known as: KENALOG Apply 1 application topically 2 (two) times daily.       Review of Systems  Constitutional: Negative for activity change, appetite change, fever and unexpected weight change.  HENT: Positive for hearing loss. Negative for congestion and voice change.   Eyes: Negative for visual disturbance.  Respiratory: Negative for cough.   Cardiovascular: Negative for leg swelling.  Gastrointestinal: Negative for abdominal pain and constipation.  Genitourinary: Negative for dysuria and urgency.  Musculoskeletal: Positive for gait problem.  Skin: Negative for color change.  Neurological: Negative for dizziness and speech difficulty.       Dementia  Psychiatric/Behavioral: Positive for confusion. Negative for agitation, behavioral problems and sleep disturbance.    Immunization History  Administered Date(s) Administered  . Influenza Whole 06/30/2018  . Influenza, High Dose Seasonal PF 06/30/2019  . Influenza-Unspecified 07/19/2017  . Moderna SARS-COVID-2 Vaccination 09/30/2019, 10/28/2019  . PPD Test 09/05/2016  . Pneumococcal Conjugate-13 08/06/2014  . Pneumococcal Polysaccharide-23 06/17/2004  . Tetanus 07/13/2012   Pertinent  Health Maintenance Due  Topic Date Due  . DEXA SCAN  07/23/2021 (Originally 04/28/1992)  . INFLUENZA VACCINE  04/28/2020  .  PNA vac Low Risk Adult  Completed   Fall Risk  06/15/2018 10/14/2017 01/28/2016  Falls in the past year? Yes Yes No  Number falls in past yr: 1 1 -  Injury with Fall? Yes Yes -  Comment pelvic fracture - -  Risk for fall due to : Impaired balance/gait - -   Functional Status Survey:    Vitals:   01/31/20 1320  BP: 110/60  Pulse: 72  Resp: 16  Temp: 97.8 F (36.6 C)  TempSrc: Oral  SpO2: 98%  Weight: 125 lb 12.8 oz (57.1 kg)  Height: '5\' 1"'$  (1.549 m)   Body mass index is 23.77 kg/m. Physical Exam Vitals and nursing note reviewed.  Constitutional:      Appearance: Normal appearance. She is normal weight.  HENT:     Head: Normocephalic and atraumatic.     Mouth/Throat:     Mouth: Mucous membranes are moist.  Eyes:  Extraocular Movements: Extraocular movements intact.     Conjunctiva/sclera: Conjunctivae normal.     Pupils: Pupils are equal, round, and reactive to light.  Cardiovascular:     Rate and Rhythm: Normal rate and regular rhythm.     Heart sounds: No murmur.  Pulmonary:     Breath sounds: No rales.  Abdominal:     General: Bowel sounds are normal. There is no distension.     Palpations: Abdomen is soft.     Tenderness: There is no abdominal tenderness.  Musculoskeletal:     Cervical back: Normal range of motion and neck supple.     Right lower leg: No edema.     Left lower leg: No edema.  Skin:    General: Skin is warm and dry.  Neurological:     General: No focal deficit present.     Mental Status: She is alert. Mental status is at baseline.     Gait: Gait abnormal.     Comments: Oriented to self.   Psychiatric:        Mood and Affect: Mood normal.     Comments: confused, pleasant     Labs reviewed: Recent Labs    08/18/19 0000 12/14/19 0000  NA 142 141  K 4.6 4.5  CL 105 105  CO2 27* 30*  BUN 29* 36*  CREATININE 1.0 1.1  CALCIUM 9.6 9.6   Recent Labs    08/18/19 0000  AST 15  ALT 11  ALKPHOS 79   Recent Labs    08/18/19 0000    WBC 7.7  NEUTROABS 4,959  HGB 11.8*  HCT 35*  PLT 231   Lab Results  Component Value Date   TSH 1.28 08/18/2019   Lab Results  Component Value Date   HGBA1C 5.7 09/30/2017   Lab Results  Component Value Date   CHOL 150 10/17/2019   HDL 76 (A) 10/17/2019   LDLCALC 79 10/17/2019   TRIG 55 10/17/2019    Significant Diagnostic Results in last 30 days:  No results found.  Assessment/Plan  Hypothyroidism Stable, continue Levothyroxine 19mg qd, TSH 1.28 08/17/19  Alzheimer's dementia with behavioral disturbance Continue Memory care unit, SNF, [facility] for safety, care assistance, continue Memantine for memory, Quetiapine for mood/behaviors.   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    Family/ staff Communication: plan of care reviewed with the patient and charge nurse.   Labs/tests ordered:  none  Time spend 25 minutes.

## 2020-02-01 ENCOUNTER — Encounter: Payer: Self-pay | Admitting: Nurse Practitioner

## 2020-02-11 DIAGNOSIS — K648 Other hemorrhoids: Secondary | ICD-10-CM | POA: Diagnosis not present

## 2020-02-11 LAB — CBC AND DIFFERENTIAL
HCT: 33 — AB (ref 36–46)
Hemoglobin: 11.2 — AB (ref 12.0–16.0)
Neutrophils Absolute: 6350
Platelets: 246 (ref 150–399)
WBC: 8.3

## 2020-02-11 LAB — BASIC METABOLIC PANEL
BUN: 41 — AB (ref 4–21)
CO2: 30 — AB (ref 13–22)
Chloride: 104 (ref 99–108)
Creatinine: 1.2 — AB (ref 0.5–1.1)
Glucose: 96
Potassium: 4.3 (ref 3.4–5.3)
Sodium: 140 (ref 137–147)

## 2020-02-11 LAB — CBC: RBC: 3.56 — AB (ref 3.87–5.11)

## 2020-02-11 LAB — COMPREHENSIVE METABOLIC PANEL: Calcium: 9.8 (ref 8.7–10.7)

## 2020-02-12 ENCOUNTER — Encounter: Payer: Self-pay | Admitting: Nurse Practitioner

## 2020-02-12 ENCOUNTER — Non-Acute Institutional Stay (SKILLED_NURSING_FACILITY): Payer: Medicare Other | Admitting: Nurse Practitioner

## 2020-02-12 ENCOUNTER — Telehealth: Payer: Self-pay | Admitting: Internal Medicine

## 2020-02-12 DIAGNOSIS — K5901 Slow transit constipation: Secondary | ICD-10-CM

## 2020-02-12 DIAGNOSIS — F418 Other specified anxiety disorders: Secondary | ICD-10-CM | POA: Diagnosis not present

## 2020-02-12 DIAGNOSIS — N183 Chronic kidney disease, stage 3 unspecified: Secondary | ICD-10-CM | POA: Diagnosis not present

## 2020-02-12 DIAGNOSIS — F0281 Dementia in other diseases classified elsewhere with behavioral disturbance: Secondary | ICD-10-CM | POA: Diagnosis not present

## 2020-02-12 DIAGNOSIS — G309 Alzheimer's disease, unspecified: Secondary | ICD-10-CM | POA: Diagnosis not present

## 2020-02-12 DIAGNOSIS — K648 Other hemorrhoids: Secondary | ICD-10-CM

## 2020-02-12 DIAGNOSIS — K625 Hemorrhage of anus and rectum: Secondary | ICD-10-CM

## 2020-02-12 DIAGNOSIS — I130 Hypertensive heart and chronic kidney disease with heart failure and stage 1 through stage 4 chronic kidney disease, or unspecified chronic kidney disease: Secondary | ICD-10-CM | POA: Diagnosis not present

## 2020-02-12 NOTE — Assessment & Plan Note (Signed)
Continue Memory care unit [facility] for safety, care assistance, continue Memantine for memory.

## 2020-02-12 NOTE — Progress Notes (Signed)
Location:   [place] Room Number: 5 Place of Service:  SNF (31)SNF Provider:  Zariah Jost, Lennie Odor NP   Virgie Dad, MD  Patient Care Team: Virgie Dad, MD as PCP - General (Internal Medicine) Amadi Frady X, NP as Nurse Practitioner (Internal Medicine) [name], MD as Attending Physician (Neurology)  Extended Emergency Contact Information Primary Emergency Contact: Key,Caroline Address: [place] of [place] Phone: [phone] Mobile Phone: [phone] Relation: Daughter Secondary Emergency Contact: Key,Tim  [place] Mobile Phone: [phone] Relation: Son  Code Status:  DNR Goals of care: Advanced Directive information Advanced Directives 02/12/2020  Does Patient Have a Medical Advance Directive? Yes  Type of Advance Directive Living will  Does patient want to make changes to medical advance directive? No - Patient declined  Copy of [place] in Chart? -  Would patient like information on creating a medical advance directive? -  Pre-existing out of facility DNR order (yellow form or pink MOST form) -     Chief Complaint  Patient presents with  . Acute Visit    Rectal bleed    HPI:  Pt is a 86 y.o. female seen today for an acute visit for reported BRBPR 02/11/20  Hx of hemorrhoids, denied constipation or rectal pain. Hx of HTN, blood pressure is controlled on Lisinopril 87m qd, Carvedilol 6.257mbid. Her mood is stable, on Quetiapine 12.12m80mid, she takes Memantine for memory.   Past Medical History:  Diagnosis Date  . Adenomatous colon polyp 07/17/97  . Allergic rhinitis   . Allergy   . Anxiety disorder   . [place] ([place] . Chronic anticoagulation   . Diverticulosis   . History of shingles   . Hypercholesteremia   . Hypertension   . Hypothyroidism   . Internal hemorrhoids   . [place] ([place]CC[place] . Memory loss   . Meniere disease    . Tension headache    Past Surgical History:  Procedure Laterality Date  . CATARACT EXTRACTION Right 2012    Allergies  Allergen Reactions  . Amitriptyline     Urinary Retention  . Amlodipine Swelling  . Erythromycin Hives  . Nitrofuran Derivatives   . Nitrofurantoin Nausea Only  . Pseudoephedrine Hcl   . Sudafed [Pseudoephedrine Hcl]     Unknown reaction per MAR   . Sulfa Antibiotics     "really ill"  . Tavist [Clemastine]     Urinary hesitancy  . Trimethoprim     Cramps  . Verapamil     Constipation    Allergies as of 02/12/2020      Reactions   Amitriptyline    Urinary Retention   Amlodipine Swelling   Erythromycin Hives   Nitrofuran Derivatives    Nitrofurantoin Nausea Only   Pseudoephedrine Hcl    Sudafed [pseudoephedrine Hcl]    Unknown reaction per MAR    Sulfa Antibiotics    "really ill"   Tavist [clemastine]    Urinary hesitancy   Trimethoprim    Cramps   Verapamil    Constipation      Medication List       Accurate as of Feb 12, 2020 11:59 PM. If you have any questions, ask your nurse or doctor.        acetaminophen 325 MG tablet Commonly known as: TYLENOL Take 650 mg by mouth every 6 (six) hours as needed.  atorvastatin 10 MG tablet Commonly known as: LIPITOR Take 1 tablet (10 mg total) by mouth daily.   Calcium 600 600 MG Tabs tablet Generic drug: calcium carbonate Take 600 mg by mouth 2 (two) times daily with a meal.   carvedilol 6.25 MG tablet Commonly known as: COREG Take 1 tablet (6.25 mg total) by mouth 2 (two) times daily with a meal.   feeding supplement Liqd Take 1 Container by mouth at bedtime. Prefers Vanilla flavored   hydrocortisone 2.5 % rectal cream Commonly known as: ANUSOL-HC Place 1 application rectally 2 (two) times daily as needed for hemorrhoids.   levothyroxine 75 MCG tablet Commonly known as: SYNTHROID Take 75 mcg by mouth daily.   lisinopril 30 MG tablet Commonly known as: ZESTRIL Take 30 mg by  mouth daily.   memantine 10 MG tablet Commonly known as: NAMENDA Take 10 mg by mouth 2 (two) times daily.   SEROquel 25 MG tablet Generic drug: QUEtiapine Take 12.5 mg by mouth 2 (two) times daily.   triamcinolone cream 0.5 % Commonly known as: KENALOG Apply 1 application topically 2 (two) times daily.       Review of Systems  Constitutional: Negative for activity change, appetite change and fever.  HENT: Positive for hearing loss. Negative for congestion and voice change.   Eyes: Negative for visual disturbance.  Respiratory: Negative for cough.   Cardiovascular: Negative for leg swelling.  Gastrointestinal: Positive for anal bleeding, blood in stool and constipation. Negative for abdominal distention, abdominal pain, diarrhea, nausea, rectal pain and vomiting.  Genitourinary: Negative for dysuria and urgency.  Musculoskeletal: Positive for gait problem.  Skin: Negative for color change.  Neurological: Negative for dizziness and speech difficulty.       Dementia  Psychiatric/Behavioral: Positive for confusion. Negative for agitation, behavioral problems and sleep disturbance.    Immunization History  Administered Date(s) Administered  . Influenza Whole 06/30/2018  . Influenza, High Dose Seasonal PF 06/30/2019  . Influenza-Unspecified 07/19/2017  . Moderna SARS-COVID-2 Vaccination 09/30/2019, 10/28/2019  . PPD Test 09/05/2016  . Pneumococcal Conjugate-13 08/06/2014  . Pneumococcal Polysaccharide-23 06/17/2004  . Tetanus 07/13/2012   Pertinent  Health Maintenance Due  Topic Date Due  . DEXA SCAN  07/23/2021 (Originally 04/28/1992)  . INFLUENZA VACCINE  04/28/2020  . PNA vac Low Risk Adult  Completed   Fall Risk  06/15/2018 10/14/2017 01/28/2016  Falls in the past year? Yes Yes No  Number falls in past yr: 1 1 -  Injury with Fall? Yes Yes -  Comment pelvic fracture - -  Risk for fall due to : Impaired balance/gait - -   Functional Status Survey:    Vitals:    02/12/20 1024  BP: 132/74  Pulse: 71  Resp: 16  Temp: 98 F (36.7 C)  SpO2: 94%  Weight: 125 lb 12.8 oz (57.1 kg)  Height: '5\' 1"'  (1.549 m)   Body mass index is 23.77 kg/m. Physical Exam Vitals and nursing note reviewed.  Constitutional:      Appearance: Normal appearance. She is normal weight.  HENT:     Head: Normocephalic and atraumatic.     Mouth/Throat:     Mouth: Mucous membranes are moist.  Eyes:     Extraocular Movements: Extraocular movements intact.     Conjunctiva/sclera: Conjunctivae normal.     Pupils: Pupils are equal, round, and reactive to light.  Cardiovascular:     Rate and Rhythm: Normal rate and regular rhythm.     Heart sounds: No murmur.  Pulmonary:     Breath sounds: No rales.  Abdominal:     General: Bowel sounds are normal. There is no distension.     Palpations: Abdomen is soft.     Tenderness: There is no abdominal tenderness. There is no right CVA tenderness, left CVA tenderness, guarding or rebound.  Genitourinary:    Rectum: Guaiac result negative.     Comments: Internal hemorrhoids, hard stool felt in rectum.  Musculoskeletal:     Cervical back: Normal range of motion and neck supple.     Right lower leg: No edema.     Left lower leg: No edema.  Skin:    General: Skin is warm and dry.  Neurological:     General: No focal deficit present.     Mental Status: She is alert. Mental status is at baseline.     Gait: Gait abnormal.     Comments: Oriented to self.   Psychiatric:        Mood and Affect: Mood normal.     Comments: confused, pleasant     Labs reviewed: Recent Labs    08/18/19 0000 12/14/19 0000  NA 142 141  K 4.6 4.5  CL 105 105  CO2 27* 30*  BUN 29* 36*  CREATININE 1.0 1.1  CALCIUM 9.6 9.6   Recent Labs    08/18/19 0000  AST 15  ALT 11  ALKPHOS 79   Recent Labs    08/18/19 0000  WBC 7.7  NEUTROABS 4,959  HGB 11.8*  HCT 35*  PLT 231   Lab Results  Component Value Date   TSH 1.28 08/18/2019   Lab  Results  Component Value Date   HGBA1C 5.7 09/30/2017   Lab Results  Component Value Date   CHOL 150 10/17/2019   HDL 76 (A) 10/17/2019   LDLCALC 79 10/17/2019   TRIG 55 10/17/2019    Significant Diagnostic Results in last 30 days:  No results found.  Assessment/Plan Rectal bleed Reported BRBPR 02/11/20, wbc 8.3, Hgb 11.2, plt 246, neutrophils 76.5, Na 140, K 4.3, Bun 41, creat 1.19, eGFR 40. Hx of Hemorrhoids, FOBT negative, hard stool felt, will add MiraLax qd to avoid constipation.   Internal hemorrhoid Avoid constipation, no further BRBPR today.  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   Alzheimer's dementia with behavioral disturbance Continue Memory care unit [facility] for safety, care assistance, continue Memantine for memory.   Depression with anxiety Her mood is stable, continue Quetiapine.   Slow transit constipation Adding MiraLax qd.      Family/ staff Communication: plan of care reviewed with the patient and charge nurse.   Labs/tests ordered:  CBC/diff, CMP/eGFR done 02/11/20  Time spend 25 minutes.

## 2020-02-12 NOTE — Assessment & Plan Note (Signed)
Adding MiraLax qd.

## 2020-02-12 NOTE — Assessment & Plan Note (Signed)
Avoid constipation, no further BRBPR today.

## 2020-02-12 NOTE — Assessment & Plan Note (Signed)
Her mood is stable, continue Quetiapine.

## 2020-02-12 NOTE — Telephone Encounter (Signed)
86 yo memory care resident with h/o hemorrhoids and prior bleed had episode of BRBPR this am with her BM.  There were no visible external hemorrhoids per nurse.  I ordered a cbc and bmp and advised to observe since vitals were all normal--family agreed when nurse called them.  hgb returned 11.2 and BUN/cr 41/1.19 vs 12/07/19 results of hgb 10.6, BUN 36 and cr 1.14--not remarkably different.  She'd had no more bloody stool by late afternoon when the labs returned. Recommended NP or MD f/u Monday.  Kaitlyne Friedhoff L. Jehu Mccauslin, D.O. [facility] [address], [place] [address] Cell Phone (Mon-Fri 8am-5pm):  [phone] On Call:  [phone] & follow prompts after 5pm & weekends Office Phone:  [phone] Office Fax:  [phone]

## 2020-02-12 NOTE — Assessment & Plan Note (Signed)
Blood pressure is controlled, continue Lisinopril, Carvedilol.  

## 2020-02-12 NOTE — Assessment & Plan Note (Addendum)
Reported BRBPR 02/11/20, wbc 8.3, Hgb 11.2, plt 246, neutrophils 76.5, Na 140, K 4.3, Bun 41, creat 1.19, eGFR 40. Hx of Hemorrhoids, FOBT negative, hard stool felt, will add MiraLax qd to avoid constipation.

## 2020-02-13 ENCOUNTER — Encounter: Payer: Self-pay | Admitting: Nurse Practitioner

## 2020-02-19 ENCOUNTER — Encounter: Payer: Self-pay | Admitting: Nurse Practitioner

## 2020-02-19 ENCOUNTER — Non-Acute Institutional Stay (SKILLED_NURSING_FACILITY): Payer: Medicare Other | Admitting: Nurse Practitioner

## 2020-02-19 DIAGNOSIS — G309 Alzheimer's disease, unspecified: Secondary | ICD-10-CM

## 2020-02-19 DIAGNOSIS — F0281 Dementia in other diseases classified elsewhere with behavioral disturbance: Secondary | ICD-10-CM | POA: Diagnosis not present

## 2020-02-19 DIAGNOSIS — K648 Other hemorrhoids: Secondary | ICD-10-CM

## 2020-02-19 DIAGNOSIS — I130 Hypertensive heart and chronic kidney disease with heart failure and stage 1 through stage 4 chronic kidney disease, or unspecified chronic kidney disease: Secondary | ICD-10-CM | POA: Diagnosis not present

## 2020-02-19 DIAGNOSIS — K5901 Slow transit constipation: Secondary | ICD-10-CM | POA: Diagnosis not present

## 2020-02-19 DIAGNOSIS — F418 Other specified anxiety disorders: Secondary | ICD-10-CM | POA: Diagnosis not present

## 2020-02-19 DIAGNOSIS — N183 Chronic kidney disease, stage 3 unspecified: Secondary | ICD-10-CM

## 2020-02-19 NOTE — Assessment & Plan Note (Signed)
Her mood is controlled except irritable when she could not comprehend what's going on around her.  Continue Quetiapine.

## 2020-02-19 NOTE — Progress Notes (Signed)
Location:   SNF [place] Room Number: 105 Place of Service:  SNF (31) Provider: [facility] Dellamae Rosamilia NP  Virgie Dad, MD  Patient Care Team: Virgie Dad, MD as PCP - General (Internal Medicine) Lashan Macias X, NP as Nurse Practitioner (Internal Medicine) [name], MD as Attending Physician (Neurology)  Extended Emergency Contact Information Primary Emergency Contact: Key,Caroline Address: [place] Phone: [phone] Mobile Phone: [phone] Relation: Daughter Secondary Emergency Contact: Key,Tim  [place] Mobile Phone: [phone] Relation: Son  Code Status: DNR Goals of care: Advanced Directive information Advanced Directives 02/12/2020  Does Patient Have a Medical Advance Directive? Yes  Type of Advance Directive Living will  Does patient want to make changes to medical advance directive? No - Patient declined  Copy of [place] in Chart? -  Would patient like information on creating a medical advance directive? -  Pre-existing out of facility DNR order (yellow form or pink MOST form) -     Chief Complaint  Patient presents with  . Acute Visit    Rectal bleed    HPI:  Pt is a 86 y.o. female seen today for an acute visit for recurrent copious amount of blood in commode 02/18/20, negative FOBT upon my examination today, denied abd pain, nausea, vomiting, constipation, afebrile. Hx of internal hemorrhoids, last BRBPR 02/11/20, prn Anusol rectal cream is not adequate. The patient resides in memory care unit, [facility], on Memantine for memory,Quetiapine 12.5mg  bid for mood. HTN, blood pressure si controlled on Lisinopril 30mg  qd, Carvedilol 6.25mg  bid.    Past Medical History:  Diagnosis Date  . Adenomatous colon polyp 07/17/97  . Allergic rhinitis   . Allergy   . Anxiety disorder   . Atrial fibrillation ([place])   . Chronic anticoagulation   . Diverticulosis   .  History of shingles   . Hypercholesteremia   . Hypertension   . Hypothyroidism   . Internal hemorrhoids   . Melanoma ([place])   . Memory loss   . Meniere disease   . Tension headache    Past Surgical History:  Procedure Laterality Date  . CATARACT EXTRACTION Right 2012    Allergies  Allergen Reactions  . Amitriptyline     Urinary Retention  . Amlodipine Swelling  . Erythromycin Hives  . Nitrofuran Derivatives   . Nitrofurantoin Nausea Only  . Pseudoephedrine Hcl   . Sudafed [Pseudoephedrine Hcl]     Unknown reaction per MAR   . Sulfa Antibiotics     "really ill"  . Tavist [Clemastine]     Urinary hesitancy  . Trimethoprim     Cramps  . Verapamil     Constipation    Allergies as of 02/19/2020      Reactions   Amitriptyline    Urinary Retention   Amlodipine Swelling   Erythromycin Hives   Nitrofuran Derivatives    Nitrofurantoin Nausea Only   Pseudoephedrine Hcl    Sudafed [pseudoephedrine Hcl]    Unknown reaction per MAR    Sulfa Antibiotics    "really ill"   Tavist [clemastine]    Urinary hesitancy   Trimethoprim    Cramps   Verapamil    Constipation      Medication List       Accurate as of Feb 19, 2020  4:41 PM. If you have any questions, ask your nurse or doctor.  STOP taking these medications   triamcinolone cream 0.5 % Commonly known as: KENALOG Stopped by: Lasheika Ortloff X Jon Lall, NP     TAKE these medications   acetaminophen 325 MG tablet Commonly known as: TYLENOL Take 325 mg by mouth every 6 (six) hours as needed.   atorvastatin 10 MG tablet Commonly known as: LIPITOR Take 1 tablet (10 mg total) by mouth daily.   Calcium 600 600 MG Tabs tablet Generic drug: calcium carbonate Take 600 mg by mouth 2 (two) times daily with a meal.   carvedilol 6.25 MG tablet Commonly known as: COREG Take 1 tablet (6.25 mg total) by mouth 2 (two) times daily with a meal.   feeding supplement Liqd Take 1 Container by mouth at bedtime. Prefers Vanilla  flavored   hydrocortisone 2.5 % rectal cream Commonly known as: ANUSOL-HC Place 1 application rectally 2 (two) times daily as needed for hemorrhoids.   levothyroxine 75 MCG tablet Commonly known as: SYNTHROID Take 75 mcg by mouth daily.   lisinopril 30 MG tablet Commonly known as: ZESTRIL Take 30 mg by mouth daily.   memantine 10 MG tablet Commonly known as: NAMENDA Take 10 mg by mouth 2 (two) times daily.   polyethylene glycol 17 g packet Commonly known as: MIRALAX / GLYCOLAX Take 17 g by mouth daily.   SEROquel 25 MG tablet Generic drug: QUEtiapine Take 12.5 mg by mouth 2 (two) times daily.       Review of Systems  Constitutional: Negative for activity change, appetite change and fever.  HENT: Positive for hearing loss. Negative for congestion and voice change.   Eyes: Negative for visual disturbance.  Respiratory: Negative for cough.   Cardiovascular: Negative for leg swelling.  Gastrointestinal: Positive for anal bleeding and constipation. Negative for abdominal distention, abdominal pain, blood in stool, diarrhea, nausea, rectal pain and vomiting.  Genitourinary: Negative for dysuria and urgency.  Musculoskeletal: Positive for gait problem.  Skin: Negative for color change.  Neurological: Negative for dizziness and speech difficulty.       Dementia  Psychiatric/Behavioral: Positive for confusion. Negative for agitation, behavioral problems and sleep disturbance.    Immunization History  Administered Date(s) Administered  . Influenza Whole 06/30/2018  . Influenza, High Dose Seasonal PF 06/30/2019  . Influenza-Unspecified 07/19/2017  . Moderna SARS-COVID-2 Vaccination 09/30/2019, 10/28/2019  . PPD Test 09/05/2016  . Pneumococcal Conjugate-13 08/06/2014  . Pneumococcal Polysaccharide-23 06/17/2004  . Tetanus 07/13/2012   Pertinent  Health Maintenance Due  Topic Date Due  . DEXA SCAN  07/23/2021 (Originally 04/28/1992)  . INFLUENZA VACCINE  04/28/2020  . PNA  vac Low Risk Adult  Completed   Fall Risk  06/15/2018 10/14/2017 01/28/2016  Falls in the past year? Yes Yes No  Number falls in past yr: 1 1 -  Injury with Fall? Yes Yes -  Comment pelvic fracture - -  Risk for fall due to : Impaired balance/gait - -   Functional Status Survey:    Vitals:   02/19/20 1429  BP: (!) 160/80  Pulse: 80  Resp: 16  Temp: (!) 97.4 F (36.3 C)  SpO2: 95%  Weight: 125 lb 12.8 oz (57.1 kg)  Height: 5\' 1"  (1.549 m)   Body mass index is 23.77 kg/m. Physical Exam Vitals and nursing note reviewed.  Constitutional:      Appearance: Normal appearance. She is normal weight.  HENT:     Head: Normocephalic and atraumatic.     Mouth/Throat:     Mouth: Mucous membranes are moist.  Eyes:     Extraocular Movements: Extraocular movements intact.     Conjunctiva/sclera: Conjunctivae normal.     Pupils: Pupils are equal, round, and reactive to light.  Cardiovascular:     Rate and Rhythm: Normal rate and regular rhythm.     Heart sounds: No murmur.  Pulmonary:     Breath sounds: No rales.  Abdominal:     General: Bowel sounds are normal. There is no distension.     Palpations: Abdomen is soft.     Tenderness: There is no abdominal tenderness. There is no right CVA tenderness, left CVA tenderness, guarding or rebound.  Genitourinary:    Rectum: Guaiac result negative.     Comments: Internal hemorrhoids, soft stool felt.  Musculoskeletal:     Cervical back: Normal range of motion and neck supple.     Right lower leg: No edema.     Left lower leg: No edema.  Skin:    General: Skin is warm and dry.  Neurological:     Mental Status: She is alert. Mental status is at baseline.     Gait: Gait abnormal.     Comments: Oriented to self.   Psychiatric:        Mood and Affect: Mood normal.     Comments: confused, pleasant     Labs reviewed: Recent Labs    08/18/19 0000 12/14/19 0000 02/11/20 0000  NA 142 141 140  K 4.6 4.5 4.3  CL 105 105 104  CO2 27*  30* 30*  BUN 29* 36* 41*  CREATININE 1.0 1.1 1.2*  CALCIUM 9.6 9.6 9.8   Recent Labs    08/18/19 0000  AST 15  ALT 11  ALKPHOS 79   Recent Labs    08/18/19 0000 02/11/20 0000  WBC 7.7 8.3  NEUTROABS 4,959 6,350  HGB 11.8* 11.2*  HCT 35* 33*  PLT 231 246   Lab Results  Component Value Date   TSH 1.28 08/18/2019   Lab Results  Component Value Date   HGBA1C 5.7 09/30/2017   Lab Results  Component Value Date   CHOL 150 10/17/2019   HDL 76 (A) 10/17/2019   LDLCALC 79 10/17/2019   TRIG 55 10/17/2019    Significant Diagnostic Results in last 30 days:  No results found.  Assessment/Plan: Internal hemorrhoid 10/03/18 dc 2.5% hydrocortisone rectal cream-POA: never works-try Hydrocortisone supp 25mg  qd.  02/11/20 BRBPR 02/18/20 BRBPR 02/19/20 Hydrocortisone supp 25mg  bid x 2 weeks, update CBC in am, may consider GI consultation if HPOA desires. Hydrocortisone supp is not covered by her insurance, will apply 2.5% hydrocortisone rectal cream bid x 30 days.   Slow transit constipation No constipation, continue MiraLax.   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   Depression with anxiety Her mood is controlled except irritable when she could not comprehend what's going on around her.  Continue Quetiapine.     Family/ staff Communication: plan of care reviewed with the patient and charge nurse.   Labs/tests ordered:  CBC  Time spend 25 minutes.

## 2020-02-19 NOTE — Assessment & Plan Note (Signed)
Continue memory care unit for safety, care assistance, continue Memantine

## 2020-02-19 NOTE — Assessment & Plan Note (Signed)
No constipation, continue MiraLax.

## 2020-02-19 NOTE — Assessment & Plan Note (Signed)
Blood pressure is controlled, continue Lisinopril, Carvedilol.  

## 2020-02-19 NOTE — Assessment & Plan Note (Addendum)
10/03/18 dc 2.5% hydrocortisone rectal cream-POA: never works-try Hydrocortisone supp 25mg  qd.  02/11/20 BRBPR 02/18/20 BRBPR 02/19/20 Hydrocortisone supp 25mg  bid x 2 weeks, update CBC in am, may consider GI consultation if HPOA desires. Hydrocortisone supp is not covered by her insurance, will apply 2.5% hydrocortisone rectal cream bid x 30 days.

## 2020-02-20 DIAGNOSIS — K648 Other hemorrhoids: Secondary | ICD-10-CM | POA: Diagnosis not present

## 2020-03-14 ENCOUNTER — Non-Acute Institutional Stay (SKILLED_NURSING_FACILITY): Payer: Medicare Other | Admitting: Internal Medicine

## 2020-03-14 ENCOUNTER — Encounter: Payer: Self-pay | Admitting: Internal Medicine

## 2020-03-14 DIAGNOSIS — F0281 Dementia in other diseases classified elsewhere with behavioral disturbance: Secondary | ICD-10-CM

## 2020-03-14 DIAGNOSIS — G309 Alzheimer's disease, unspecified: Secondary | ICD-10-CM | POA: Diagnosis not present

## 2020-03-14 DIAGNOSIS — I480 Paroxysmal atrial fibrillation: Secondary | ICD-10-CM

## 2020-03-14 DIAGNOSIS — E039 Hypothyroidism, unspecified: Secondary | ICD-10-CM | POA: Diagnosis not present

## 2020-03-14 DIAGNOSIS — I10 Essential (primary) hypertension: Secondary | ICD-10-CM | POA: Diagnosis not present

## 2020-03-14 NOTE — Progress Notes (Signed)
Location:   [place] Room Number: 105 Place of Service:  SNF [phone]) Provider:  Veleta Miners, MD    Patient Care Team: Virgie Dad, MD as PCP - General (Internal Medicine) Mast, Man X, NP as Nurse Practitioner (Internal Medicine) [name], MD as Attending Physician (Neurology)  Extended Emergency Contact Information Primary Emergency Contact: Key,Caroline Address: [place] of [place] Phone: [phone] Mobile Phone: [phone] Relation: Daughter Secondary Emergency Contact: Key,Tim  [place] Mobile Phone: [phone] Relation: Son  Code Status:  DNR Goals of care: Advanced Directive information Advanced Directives 03/14/2020  Does Patient Have a Medical Advance Directive? Yes  Type of Advance Directive Living will;Out of facility DNR (pink MOST or yellow form)  Does patient want to make changes to medical advance directive? No - Patient declined  Copy of [place] in Chart? -  Would patient like information on creating a medical advance directive? -  Pre-existing out of facility DNR order (yellow form or pink MOST form) -     Chief Complaint  Patient presents with  . Medical Management of Chronic Issues    Routine Visit.    HPI:  Pt is a 86 y.o. female seen today for medical management of chronic diseases.    She has h/o Dementia with Behavior issues, Hypertension, Hypothyrodism, PAF not on Any anticoagulation, Hyperlipemia She is long term Resident of Memory Unit. Weight stable No New Nursing issues. Stays very anxious. Does have Aphasia. No Recent Falls.  Past Medical History:  Diagnosis Date  . Adenomatous colon polyp 07/17/97  . Allergic rhinitis   . Allergy   . Anxiety disorder   . Atrial fibrillation ([place])   . Chronic anticoagulation   . Diverticulosis   . History of shingles   . Hypercholesteremia   . Hypertension   .  Hypothyroidism   . Internal hemorrhoids   . Melanoma ([place])   . Memory loss   . Meniere disease   . Tension headache    Past Surgical History:  Procedure Laterality Date  . CATARACT EXTRACTION Right 2012    Allergies  Allergen Reactions  . Amitriptyline     Urinary Retention  . Amlodipine Swelling  . Erythromycin Hives  . Nitrofuran Derivatives   . Nitrofurantoin Nausea Only  . Pseudoephedrine Hcl   . Sudafed [Pseudoephedrine Hcl]     Unknown reaction per MAR   . Sulfa Antibiotics     "really ill"  . Tavist [Clemastine]     Urinary hesitancy  . Trimethoprim     Cramps  . Verapamil     Constipation    Allergies as of 03/14/2020      Reactions   Amitriptyline    Urinary Retention   Amlodipine Swelling   Erythromycin Hives   Nitrofuran Derivatives    Nitrofurantoin Nausea Only   Pseudoephedrine Hcl    Sudafed [pseudoephedrine Hcl]    Unknown reaction per MAR    Sulfa Antibiotics    "really ill"   Tavist [clemastine]    Urinary hesitancy   Trimethoprim    Cramps   Verapamil    Constipation      Medication List       Accurate as of March 14, 2020  2:08 PM. If you have any questions, ask your nurse or doctor.        acetaminophen 325 MG tablet Commonly known as: TYLENOL  Take 325 mg by mouth every 6 (six) hours as needed.   atorvastatin 10 MG tablet Commonly known as: LIPITOR Take 1 tablet (10 mg total) by mouth daily.   Calcium 600 600 MG Tabs tablet Generic drug: calcium carbonate Take 600 mg by mouth 2 (two) times daily with a meal.   carvedilol 6.25 MG tablet Commonly known as: COREG Take 1 tablet (6.25 mg total) by mouth 2 (two) times daily with a meal.   feeding supplement Liqd Take 1 Container by mouth at bedtime. Prefers Vanilla flavored   hydrocortisone 2.5 % rectal cream Commonly known as: ANUSOL-HC Place 1 application rectally 2 (two) times daily as needed for hemorrhoids.   levothyroxine 75 MCG tablet Commonly known as:  SYNTHROID Take 75 mcg by mouth daily.   lisinopril 30 MG tablet Commonly known as: ZESTRIL Take 30 mg by mouth daily.   memantine 10 MG tablet Commonly known as: NAMENDA Take 10 mg by mouth 2 (two) times daily.   polyethylene glycol 17 g packet Commonly known as: MIRALAX / GLYCOLAX Take 17 g by mouth daily.   SEROquel 25 MG tablet Generic drug: QUEtiapine Take 12.5 mg by mouth 2 (two) times daily.       Review of Systems  Unable to perform ROS: Dementia    Immunization History  Administered Date(s) Administered  . Influenza Whole 06/30/2018  . Influenza, High Dose Seasonal PF 06/30/2019  . Influenza-Unspecified 07/19/2017  . Moderna SARS-COVID-2 Vaccination 09/30/2019, 10/28/2019  . PPD Test 09/05/2016  . Pneumococcal Conjugate-13 08/06/2014  . Pneumococcal Polysaccharide-23 06/17/2004  . Tetanus 07/13/2012   Pertinent  Health Maintenance Due  Topic Date Due  . DEXA SCAN  07/23/2021 (Originally 04/28/1992)  . INFLUENZA VACCINE  04/28/2020  . PNA vac Low Risk Adult  Completed   Fall Risk  06/15/2018 10/14/2017 01/28/2016  Falls in the past year? Yes Yes No  Number falls in past yr: 1 1 -  Injury with Fall? Yes Yes -  Comment pelvic fracture - -  Risk for fall due to : Impaired balance/gait - -   Functional Status Survey:    Vitals:   03/14/20 1404  BP: 130/60  Pulse: 68  Resp: 18  Temp: 98.3 F (36.8 C)  SpO2: 95%  Weight: 124 lb 9.6 oz (56.5 kg)  Height: 5\' 1"  (1.549 m)   Body mass index is 23.54 kg/m. Physical Exam  Constitutional:  Well-developed and well-nourished.  HENT:  Head: Normocephalic.  Mouth/Throat: Oropharynx is clear and moist.  Eyes: Pupils are equal, round, and reactive to light.  Neck: Neck supple.  Cardiovascular: Normal rate and normal heart sounds.  No murmur heard. Pulmonary/Chest: Effort normal and breath sounds normal. No respiratory distress. No wheezes. She has no rales.  Abdominal: Soft. Bowel sounds are normal. No  distension. There is no tenderness. There is no rebound.  Musculoskeletal: No edema.  Lymphadenopathy: none Neurological: Walks with her walker. Has aphasia. Independent in her feedings. Need helps with her ADLS Skin: Skin is warm and dry.  Psychiatric: Normal mood and affect. Behavior is normal. Thought content normal.    Labs reviewed: Recent Labs    08/18/19 0000 12/14/19 0000 02/11/20 0000  NA 142 141 140  K 4.6 4.5 4.3  CL 105 105 104  CO2 27* 30* 30*  BUN 29* 36* 41*  CREATININE 1.0 1.1 1.2*  CALCIUM 9.6 9.6 9.8   Recent Labs    08/18/19 0000  AST 15  ALT 11  ALKPHOS 79  Recent Labs    08/18/19 0000 02/11/20 0000  WBC 7.7 8.3  NEUTROABS 4,959 6,350  HGB 11.8* 11.2*  HCT 35* 33*  PLT 231 246   Lab Results  Component Value Date   TSH 1.28 08/18/2019   Lab Results  Component Value Date   HGBA1C 5.7 09/30/2017   Lab Results  Component Value Date   CHOL 150 10/17/2019   HDL 76 (A) 10/17/2019   LDLCALC 79 10/17/2019   TRIG 55 10/17/2019    Significant Diagnostic Results in last 30 days:  No results found.  Assessment/Plan Essential hypertension Stable on Coreg and Lisinopril  Paroxysmal atrial fibrillation Rate Controlled on Coreg Not on Any anticoagulation due to risk of falls Acquired hypothyroidism TSH Normal in 11/20  CKD stage 3 Some worsening of BUN and Creat She had some Hemorrhoid bleeding at that time Will continue to follow for now  Late onset Alzheimer's disease with behavioral disturbance On Namenda  Continues to need Seroquel  Anemia, Hgb is stable  Hyperlipidemia On Statin LDL 76  H/o Hemorrhoids Doing well on Anusol  Family/ staff Communication:   Labs/tests ordered:

## 2020-03-25 ENCOUNTER — Non-Acute Institutional Stay (SKILLED_NURSING_FACILITY): Payer: Medicare Other | Admitting: Nurse Practitioner

## 2020-03-25 ENCOUNTER — Encounter: Payer: Self-pay | Admitting: Nurse Practitioner

## 2020-03-25 DIAGNOSIS — F418 Other specified anxiety disorders: Secondary | ICD-10-CM

## 2020-03-25 DIAGNOSIS — F0281 Dementia in other diseases classified elsewhere with behavioral disturbance: Secondary | ICD-10-CM

## 2020-03-25 DIAGNOSIS — L309 Dermatitis, unspecified: Secondary | ICD-10-CM

## 2020-03-25 DIAGNOSIS — E039 Hypothyroidism, unspecified: Secondary | ICD-10-CM | POA: Diagnosis not present

## 2020-03-25 DIAGNOSIS — I130 Hypertensive heart and chronic kidney disease with heart failure and stage 1 through stage 4 chronic kidney disease, or unspecified chronic kidney disease: Secondary | ICD-10-CM | POA: Diagnosis not present

## 2020-03-25 DIAGNOSIS — K5901 Slow transit constipation: Secondary | ICD-10-CM

## 2020-03-25 DIAGNOSIS — G309 Alzheimer's disease, unspecified: Secondary | ICD-10-CM

## 2020-03-25 DIAGNOSIS — N183 Chronic kidney disease, stage 3 unspecified: Secondary | ICD-10-CM

## 2020-03-25 LAB — CBC AND DIFFERENTIAL
HCT: 35 — AB (ref 36–46)
Hemoglobin: 11.4 — AB (ref 12.0–16.0)
WBC: 9.3

## 2020-03-25 LAB — CBC: RBC: 3.69 — AB (ref 3.87–5.11)

## 2020-03-25 NOTE — Assessment & Plan Note (Signed)
Stable, continue MiraLax qd.  

## 2020-03-25 NOTE — Assessment & Plan Note (Signed)
Continue memory care unit [facility] for safety, care assistance, continue Memantine for memory

## 2020-03-25 NOTE — Assessment & Plan Note (Signed)
Her mood is managed, continue Quetiapine.

## 2020-03-25 NOTE — Assessment & Plan Note (Signed)
Stable, continue Levothyroxine 48mcg qd, TSH 1.28 08/17/19

## 2020-03-25 NOTE — Assessment & Plan Note (Signed)
Blood pressure is controlled, continue Lisinopril 30mg  qd, Carvedilol 6.25mg  bid.

## 2020-03-25 NOTE — Progress Notes (Signed)
Location:   [facility] Room Number: 5 Place of Service:  SNF (31) Provider: Lennie Odor Roena Sassaman NP  Virgie Dad, MD  Patient Care Team: Virgie Dad, MD as PCP - General (Internal Medicine) Reata Petrov X, NP as Nurse Practitioner (Internal Medicine) [name], MD as Attending Physician (Neurology)  Extended Emergency Contact Information Primary Emergency Contact: Key,Caroline Address: [place] Phone: [phone] Mobile Phone: [phone] Relation: Daughter Secondary Emergency Contact: Key,Tim  [place] Mobile Phone: [phone] Relation: Son  Code Status: DNR Goals of care: Advanced Directive information Advanced Directives 03/14/2020  Does Patient Have a Medical Advance Directive? Yes  Type of Advance Directive Living will;Out of facility DNR (pink MOST or yellow form)  Does patient want to make changes to medical advance directive? No - Patient declined  Copy of [place] in Chart? -  Would patient like information on creating a medical advance directive? -  Pre-existing out of facility DNR order (yellow form or pink MOST form) -     Chief Complaint  Patient presents with  . Acute Visit    redness left arm     HPI:  Pt is a 86 y.o. female seen today for an acute visit for itchy irritated a band area mid left forearm, suspected from bracelets use, no s/s of infection.  Dementia, takes Memantine for memory  Depression/anxeity, takes Quetiapine 12.5mg  bid for mood.   HTN, blood pressure is controlled on Lisinopril 30mg  qd, Carvedilol 6.25mg  bid.   Hypothyroidism, takes Levothyroxine 104mcg qd.   Constipation, takes MiraLax qd.     Past Medical History:  Diagnosis Date  . Adenomatous colon polyp 07/17/97  . Allergic rhinitis   . Allergy   . Anxiety disorder   . Atrial fibrillation ([place])   . Chronic anticoagulation   . Diverticulosis     . History of shingles   . Hypercholesteremia   . Hypertension   . Hypothyroidism   . Internal hemorrhoids   . Melanoma ([place])   . Memory loss   . Meniere disease   . Tension headache    Past Surgical History:  Procedure Laterality Date  . CATARACT EXTRACTION Right 2012    Allergies  Allergen Reactions  . Amitriptyline     Urinary Retention  . Amlodipine Swelling  . Erythromycin Hives  . Nitrofuran Derivatives   . Nitrofurantoin Nausea Only  . Pseudoephedrine Hcl   . Sudafed [Pseudoephedrine Hcl]     Unknown reaction per MAR   . Sulfa Antibiotics     "really ill"  . Tavist [Clemastine]     Urinary hesitancy  . Trimethoprim     Cramps  . Verapamil     Constipation    Allergies as of 03/25/2020      Reactions   Amitriptyline    Urinary Retention   Amlodipine Swelling   Erythromycin Hives   Nitrofuran Derivatives    Nitrofurantoin Nausea Only   Pseudoephedrine Hcl    Sudafed [pseudoephedrine Hcl]    Unknown reaction per MAR    Sulfa Antibiotics    "really ill"   Tavist [clemastine]    Urinary hesitancy   Trimethoprim    Cramps   Verapamil    Constipation      Medication List       Accurate as of March 25, 2020 11:59 PM. If you have any questions, ask your  nurse or doctor.        acetaminophen 325 MG tablet Commonly known as: TYLENOL Take 325 mg by mouth every 6 (six) hours as needed.   atorvastatin 10 MG tablet Commonly known as: LIPITOR Take 1 tablet (10 mg total) by mouth daily.   Calcium 600 600 MG Tabs tablet Generic drug: calcium carbonate Take 600 mg by mouth 2 (two) times daily with a meal.   carvedilol 6.25 MG tablet Commonly known as: COREG Take 1 tablet (6.25 mg total) by mouth 2 (two) times daily with a meal.   feeding supplement Liqd Take 1 Container by mouth at bedtime. Prefers Vanilla flavored   hydrocortisone 2.5 % rectal cream Commonly known as: ANUSOL-HC Place 1 application rectally 2 (two) times daily as needed for  hemorrhoids.   levothyroxine 75 MCG tablet Commonly known as: SYNTHROID Take 75 mcg by mouth daily.   lisinopril 30 MG tablet Commonly known as: ZESTRIL Take 30 mg by mouth daily.   memantine 10 MG tablet Commonly known as: NAMENDA Take 10 mg by mouth 2 (two) times daily.   polyethylene glycol 17 g packet Commonly known as: MIRALAX / GLYCOLAX Take 17 g by mouth daily.   SEROquel 25 MG tablet Generic drug: QUEtiapine Take 12.5 mg by mouth 2 (two) times daily.       Review of Systems  Constitutional: Negative for activity change, appetite change and fever.  HENT: Positive for hearing loss. Negative for congestion and voice change.   Eyes: Negative for visual disturbance.  Respiratory: Negative for cough.   Cardiovascular: Negative for leg swelling.  Gastrointestinal: Negative for abdominal pain and constipation.  Genitourinary: Negative for dysuria and urgency.  Musculoskeletal: Positive for gait problem.  Skin: Positive for rash. Negative for color change.  Neurological: Negative for speech difficulty and light-headedness.       Dementia  Psychiatric/Behavioral: Positive for confusion. Negative for behavioral problems and sleep disturbance.    Immunization History  Administered Date(s) Administered  . Influenza Whole 06/30/2018  . Influenza, High Dose Seasonal PF 06/30/2019  . Influenza-Unspecified 07/19/2017  . Moderna SARS-COVID-2 Vaccination 09/30/2019, 10/28/2019  . PPD Test 09/05/2016  . Pneumococcal Conjugate-13 08/06/2014  . Pneumococcal Polysaccharide-23 06/17/2004  . Tetanus 07/13/2012   Pertinent  Health Maintenance Due  Topic Date Due  . DEXA SCAN  07/23/2021 (Originally 04/28/1992)  . INFLUENZA VACCINE  04/28/2020  . PNA vac Low Risk Adult  Completed   Fall Risk  06/15/2018 10/14/2017 01/28/2016  Falls in the past year? Yes Yes No  Number falls in past yr: 1 1 -  Injury with Fall? Yes Yes -  Comment pelvic fracture - -  Risk for fall due to :  Impaired balance/gait - -   Functional Status Survey:    Vitals:   03/25/20 1652  BP: 130/64  Pulse: 88  Resp: 20  Temp: (!) 97.4 F (36.3 C)  SpO2: 98%  Weight: 124 lb 9.6 oz (56.5 kg)  Height: 5\' 1"  (1.549 m)   Body mass index is 23.54 kg/m. Physical Exam Vitals and nursing note reviewed.  Constitutional:      Appearance: Normal appearance.  HENT:     Head: Normocephalic and atraumatic.     Mouth/Throat:     Mouth: Mucous membranes are moist.  Eyes:     Extraocular Movements: Extraocular movements intact.     Conjunctiva/sclera: Conjunctivae normal.     Pupils: Pupils are equal, round, and reactive to light.  Cardiovascular:  Rate and Rhythm: Normal rate and regular rhythm.     Heart sounds: No murmur heard.   Pulmonary:     Breath sounds: No rales.  Abdominal:     General: Bowel sounds are normal.     Palpations: Abdomen is soft.     Tenderness: There is no abdominal tenderness.  Musculoskeletal:     Cervical back: Normal range of motion and neck supple.     Right lower leg: No edema.     Left lower leg: No edema.  Skin:    General: Skin is warm and dry.     Findings: Rash present.     Comments: A band irritated, scratched area mid left forearm, no s/s of infection.   Neurological:     Mental Status: She is alert. Mental status is at baseline.     Gait: Gait abnormal.     Comments: Oriented to self.   Psychiatric:        Mood and Affect: Mood normal.     Comments: confused, irritated.      Labs reviewed: Recent Labs    08/18/19 0000 12/14/19 0000 02/11/20 0000  NA 142 141 140  K 4.6 4.5 4.3  CL 105 105 104  CO2 27* 30* 30*  BUN 29* 36* 41*  CREATININE 1.0 1.1 1.2*  CALCIUM 9.6 9.6 9.8   Recent Labs    08/18/19 0000  AST 15  ALT 11  ALKPHOS 79   Recent Labs    08/18/19 0000 02/11/20 0000 03/25/20 0000  WBC 7.7 8.3 9.3  NEUTROABS 4,959 6,350  --   HGB 11.8* 11.2* 11.4*  HCT 35* 33* 35*  PLT 231 246  --    Lab Results    Component Value Date   TSH 1.28 08/18/2019   Lab Results  Component Value Date   HGBA1C 5.7 09/30/2017   Lab Results  Component Value Date   CHOL 150 10/17/2019   HDL 76 (A) 10/17/2019   LDLCALC 79 10/17/2019   TRIG 55 10/17/2019    Significant Diagnostic Results in last 30 days:  No results found.  Assessment/Plan: Dermatitis Left mid arm, band distribution, likely irritated from bracelet, irritated appearance, apply 0.5% Triamcinolone cream bid x 2 weeks.   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   Slow transit constipation Stable, continue MiraLax qd  Hypothyroidism Stable, continue Levothyroxine 67mcg qd, TSH 1.28 08/17/19  Alzheimer's dementia with behavioral disturbance Continue memory care unit [facility] for safety, care assistance, continue Memantine for memory  Depression with anxiety Her mood is managed, continue Quetiapine.     Family/ staff Communication: plan of care reviewed with the patient and charge nurse.   Labs/tests ordered:  None  Time spend 25 minutes.

## 2020-03-25 NOTE — Assessment & Plan Note (Signed)
Left mid arm, band distribution, likely irritated from bracelet, irritated appearance, apply 0.5% Triamcinolone cream bid x 2 weeks.

## 2020-03-26 ENCOUNTER — Non-Acute Institutional Stay (SKILLED_NURSING_FACILITY): Payer: Medicare Other | Admitting: Nurse Practitioner

## 2020-03-26 ENCOUNTER — Encounter: Payer: Self-pay | Admitting: Nurse Practitioner

## 2020-03-26 DIAGNOSIS — R109 Unspecified abdominal pain: Secondary | ICD-10-CM | POA: Diagnosis not present

## 2020-03-26 DIAGNOSIS — K5901 Slow transit constipation: Secondary | ICD-10-CM

## 2020-03-26 DIAGNOSIS — E039 Hypothyroidism, unspecified: Secondary | ICD-10-CM | POA: Diagnosis not present

## 2020-03-26 DIAGNOSIS — G309 Alzheimer's disease, unspecified: Secondary | ICD-10-CM | POA: Diagnosis not present

## 2020-03-26 DIAGNOSIS — N183 Chronic kidney disease, stage 3 unspecified: Secondary | ICD-10-CM

## 2020-03-26 DIAGNOSIS — I130 Hypertensive heart and chronic kidney disease with heart failure and stage 1 through stage 4 chronic kidney disease, or unspecified chronic kidney disease: Secondary | ICD-10-CM

## 2020-03-26 DIAGNOSIS — F0281 Dementia in other diseases classified elsewhere with behavioral disturbance: Secondary | ICD-10-CM

## 2020-03-26 DIAGNOSIS — F418 Other specified anxiety disorders: Secondary | ICD-10-CM | POA: Diagnosis not present

## 2020-03-26 LAB — CBC AND DIFFERENTIAL
HCT: 35 — AB (ref 36–46)
Hemoglobin: 11.7 — AB (ref 12.0–16.0)
Neutrophils Absolute: 7003
Platelets: 282 (ref 150–399)
WBC: 9.4

## 2020-03-26 LAB — CBC: RBC: 3.77 — AB (ref 3.87–5.11)

## 2020-03-26 NOTE — Assessment & Plan Note (Signed)
Crying, fearful today, CBC/diff, CMP/eGFR, UA C/S, Xray abd for abd discomfort, continue Seroquel for now.

## 2020-03-26 NOTE — Assessment & Plan Note (Addendum)
Takes MiraLax qd, pending X-ray abd for abd discomfort, observe.  03/26/20 wbc 9.4, Hgb 11.7, plt 282, neutrophils 74.5, Na 137, K 4.87, Bun 54, creat 1.44, eGFR 31 03/26/20 X-ray abd moderate to large quantity of fecal material present in colon 03/27/20 adding Senokot S II po qd

## 2020-03-26 NOTE — Assessment & Plan Note (Addendum)
Blood pressure is controlled, continue Lisinopril.  03/26/20 wbc 9.4, Hgb 11.7, plt 282, neutrophils 74.5, Na 137, K 4.87, Bun 54, creat 1.44, eGFR 31 03/26/20 X-ray abd moderate to large quantity of fecal material present in colon 03/27/20 adding Senokot S, encourage oral fluid intake, update BMP one week

## 2020-03-26 NOTE — Assessment & Plan Note (Addendum)
Stat X-ray abd, CBC/diff, CMP/eGFR, UA C/S 03/26/20 wbc 9.4, Hgb 11.7, plt 282, neutrophils 74.5, Na 137, K 4.87, Bun 54, creat 1.44, eGFR 31 03/26/20 X-ray abd moderate to large quantity of fecal material present in colon 03/27/20 adding Senokot S

## 2020-03-26 NOTE — Assessment & Plan Note (Signed)
More confused today, pending CBC/diff, CMP/eGFR, UA C/S, X-ray abd for abd discomfort. Continue Memantine for memory.

## 2020-03-26 NOTE — Assessment & Plan Note (Signed)
TSH 1.28 08/17/19, continue Levothyroxine.

## 2020-03-26 NOTE — Progress Notes (Signed)
Location:    [facility] Room Number: 105 Place of Service:  SNF (31) Provider: Lennie Odor  NP  Virgie Dad, MD  Patient Care Team: Virgie Dad, MD as PCP - General (Internal Medicine) ,  X, NP as Nurse Practitioner (Internal Medicine) [name], MD as Attending Physician (Neurology)  Extended Emergency Contact Information Primary Emergency Contact: Key,Caroline Address: [place] of [place] Phone: [phone] Mobile Phone: [phone] Relation: Daughter Secondary Emergency Contact: Key,Tim  [place] Mobile Phone: [phone] Relation: Son  Code Status: DNR Goals of care: Advanced Directive information Advanced Directives 03/14/2020  Does Patient Have a Medical Advance Directive? Yes  Type of Advance Directive Living will;Out of facility DNR (pink MOST or yellow form)  Does patient want to make changes to medical advance directive? No - Patient declined  Copy of [place] in Chart? -  Would patient like information on creating a medical advance directive? -  Pre-existing out of facility DNR order (yellow form or pink MOST form) -     Chief Complaint  Patient presents with  . Acute Visit    Abdominal discomfort    HPI:  Pt is a 86 y.o. female seen today for an acute visit for reported the patient's abdominal discomfort, says something inside me. Crying, fearful, more confused. She is afebrile, limited HPI due to the patient's advanced dementia.   Dementia, takes Memantine for memory             Depression/anxeity, takes Quetiapine 12.63m bid for mood.              HTN, blood pressure is controlled on cryLisinopril 365mqd, Carvedilol 6.252mid.             Hypothyroidism, takes Levothyroxine 54m67md.              Constipation, takes MiraLax qd.     Past Medical History:  Diagnosis Date  . Adenomatous colon polyp 07/17/97  .  Allergic rhinitis   . Allergy   . Anxiety disorder   . Atrial fibrillation (HCC)[place]. Chronic anticoagulation   . Diverticulosis   . History of shingles   . Hypercholesteremia   . Hypertension   . Hypothyroidism   . Internal hemorrhoids   . Melanoma (HCC)[place]. Memory loss   . Meniere disease   . Tension headache    Past Surgical History:  Procedure Laterality Date  . CATARACT EXTRACTION Right 2012    Allergies  Allergen Reactions  . Amitriptyline     Urinary Retention  . Amlodipine Swelling  . Erythromycin Hives  . Nitrofuran Derivatives   . Nitrofurantoin Nausea Only  . Pseudoephedrine Hcl   . Sudafed [Pseudoephedrine Hcl]     Unknown reaction per MAR   . Sulfa Antibiotics     "really ill"  . Tavist [Clemastine]     Urinary hesitancy  . Trimethoprim     Cramps  . Verapamil     Constipation    Allergies as of 03/26/2020      Reactions   Amitriptyline    Urinary Retention   Amlodipine Swelling   Erythromycin Hives   Nitrofuran Derivatives    Nitrofurantoin Nausea Only   Pseudoephedrine Hcl    Sudafed [pseudoephedrine Hcl]    Unknown reaction per MAR    Sulfa Antibiotics    "really ill"  Tavist [clemastine]    Urinary hesitancy   Trimethoprim    Cramps   Verapamil    Constipation      Medication List       Accurate as of March 26, 2020 11:59 PM. If you have any questions, ask your nurse or doctor.        acetaminophen 325 MG tablet Commonly known as: TYLENOL Take 325 mg by mouth every 6 (six) hours as needed.   atorvastatin 10 MG tablet Commonly known as: LIPITOR Take 1 tablet (10 mg total) by mouth daily.   Calcium 600 600 MG Tabs tablet Generic drug: calcium carbonate Take 600 mg by mouth 2 (two) times daily with a meal.   carvedilol 6.25 MG tablet Commonly known as: COREG Take 1 tablet (6.25 mg total) by mouth 2 (two) times daily with a meal.   feeding supplement Liqd Take 1 Container by mouth at bedtime. Prefers Vanilla flavored     hydrocortisone 2.5 % rectal cream Commonly known as: ANUSOL-HC Place 1 application rectally 2 (two) times daily as needed for hemorrhoids.   levothyroxine 75 MCG tablet Commonly known as: SYNTHROID Take 75 mcg by mouth daily.   lisinopril 30 MG tablet Commonly known as: ZESTRIL Take 30 mg by mouth daily.   memantine 10 MG tablet Commonly known as: NAMENDA Take 10 mg by mouth 2 (two) times daily.   polyethylene glycol 17 g packet Commonly known as: MIRALAX / GLYCOLAX Take 17 g by mouth daily.   SEROquel 25 MG tablet Generic drug: QUEtiapine Take 12.5 mg by mouth 2 (two) times daily.   triamcinolone cream 0.5 % Commonly known as: KENALOG Apply 1 application topically 2 (two) times daily.       Review of Systems  Constitutional: Positive for activity change and appetite change. Negative for fever.  HENT: Positive for hearing loss. Negative for congestion and voice change.   Eyes: Negative for visual disturbance.  Respiratory: Negative for cough.   Cardiovascular: Negative for leg swelling.  Gastrointestinal: Positive for abdominal distention and abdominal pain. Negative for diarrhea, nausea and vomiting.       Not sure if she is constipated, c/o abd discomfort, something inside me  Genitourinary: Negative for dysuria and urgency.  Musculoskeletal: Positive for gait problem.  Skin: Positive for rash. Negative for color change.  Neurological: Negative for dizziness and speech difficulty.       Dementia  Psychiatric/Behavioral: Positive for agitation, behavioral problems and confusion. Negative for sleep disturbance. The patient is nervous/anxious.        Crying, fearful, more confused.     Immunization History  Administered Date(s) Administered  . Influenza Whole 06/30/2018  . Influenza, High Dose Seasonal PF 06/30/2019  . Influenza-Unspecified 07/19/2017  . Moderna SARS-COVID-2 Vaccination 09/30/2019, 10/28/2019  . PPD Test 09/05/2016  . Pneumococcal Conjugate-13  08/06/2014  . Pneumococcal Polysaccharide-23 06/17/2004  . Tetanus 07/13/2012   Pertinent  Health Maintenance Due  Topic Date Due  . DEXA SCAN  07/23/2021 (Originally 04/28/1992)  . INFLUENZA VACCINE  04/28/2020  . PNA vac Low Risk Adult  Completed   Fall Risk  06/15/2018 10/14/2017 01/28/2016  Falls in the past year? Yes Yes No  Number falls in past yr: 1 1 -  Injury with Fall? Yes Yes -  Comment pelvic fracture - -  Risk for fall due to : Impaired balance/gait - -   Functional Status Survey:    Vitals:   03/26/20 1524  BP: (!) 150/80  Pulse: 76  Resp: 18  Temp: (!) 97.5 F (36.4 C)  SpO2: 98%  Weight: 124 lb 9.6 oz (56.5 kg)  Height: 5' 1" (1.549 m)   Body mass index is 23.54 kg/m. Physical Exam Vitals and nursing note reviewed.  Constitutional:      Comments: Crying, fearful  HENT:     Head: Normocephalic and atraumatic.     Mouth/Throat:     Mouth: Mucous membranes are moist.  Eyes:     Extraocular Movements: Extraocular movements intact.     Conjunctiva/sclera: Conjunctivae normal.     Pupils: Pupils are equal, round, and reactive to light.  Cardiovascular:     Rate and Rhythm: Normal rate and regular rhythm.     Heart sounds: No murmur heard.   Pulmonary:     Breath sounds: No rales.  Abdominal:     General: Bowel sounds are normal. There is distension.     Palpations: Abdomen is soft.     Tenderness: There is abdominal tenderness. There is no right CVA tenderness, left CVA tenderness, guarding or rebound.     Hernia: No hernia is present.     Comments: Discomfort in general, not sure discomfort location of abd  Musculoskeletal:     Cervical back: Normal range of motion and neck supple.     Right lower leg: No edema.     Left lower leg: No edema.  Skin:    General: Skin is warm and dry.     Findings: Rash present.     Comments: A band irritated, scratched area mid left forearm, no s/s of infection.   Neurological:     Mental Status: She is alert.  Mental status is at baseline.     Gait: Gait abnormal.     Comments: Oriented to self.   Psychiatric:     Comments: confused, irritated, crying, fearful     Labs reviewed: Recent Labs    08/18/19 0000 12/14/19 0000 02/11/20 0000  NA 142 141 140  K 4.6 4.5 4.3  CL 105 105 104  CO2 27* 30* 30*  BUN 29* 36* 41*  CREATININE 1.0 1.1 1.2*  CALCIUM 9.6 9.6 9.8   Recent Labs    08/18/19 0000  AST 15  ALT 11  ALKPHOS 79   Recent Labs    08/18/19 0000 02/11/20 0000 03/25/20 0000  WBC 7.7 8.3 9.3  NEUTROABS 4,959 6,350  --   HGB 11.8* 11.2* 11.4*  HCT 35* 33* 35*  PLT 231 246  --    Lab Results  Component Value Date   TSH 1.28 08/18/2019   Lab Results  Component Value Date   HGBA1C 5.7 09/30/2017   Lab Results  Component Value Date   CHOL 150 10/17/2019   HDL 76 (A) 10/17/2019   LDLCALC 79 10/17/2019   TRIG 55 10/17/2019    Significant Diagnostic Results in last 30 days:  No results found.  Assessment/Plan: Abdominal discomfort Stat X-ray abd, CBC/diff, CMP/eGFR, UA C/S 03/26/20 wbc 9.4, Hgb 11.7, plt 282, neutrophils 74.5, Na 137, K 4.87, Bun 54, creat 1.44, eGFR 31 03/26/20 X-ray abd moderate to large quantity of fecal material present in colon 03/27/20 adding Senokot S  Depression with anxiety Crying, fearful today, CBC/diff, CMP/eGFR, UA C/S, Xray abd for abd discomfort, continue Seroquel for now.   Alzheimer's dementia with behavioral disturbance More confused today, pending CBC/diff, CMP/eGFR, UA C/S, X-ray abd for abd discomfort. Continue Memantine for memory.   Hypothyroidism TSH 1.28 08/17/19, continue Levothyroxine.  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  03/26/20 wbc 9.4, Hgb 11.7, plt 282, neutrophils 74.5, Na 137, K 4.87, Bun 54, creat 1.44, eGFR 31 03/26/20 X-ray abd moderate to large quantity of fecal material present in colon 03/27/20 adding  Senokot S, encourage oral fluid intake, update BMP one week   Slow transit constipation Takes MiraLax qd, pending X-ray abd for abd discomfort, observe.  03/26/20 wbc 9.4, Hgb 11.7, plt 282, neutrophils 74.5, Na 137, K 4.87, Bun 54, creat 1.44, eGFR 31 03/26/20 X-ray abd moderate to large quantity of fecal material present in colon 03/27/20 adding Senokot S II po qd     Family/ staff Communication: plan of care reviewed with the patient and charge nurse.   Labs/tests ordered: CBC/diff, CMP/eGFR, UA C/S, X-ray abd  Time spend 35 minutes.

## 2020-03-27 ENCOUNTER — Encounter: Payer: Self-pay | Admitting: Nurse Practitioner

## 2020-04-02 DIAGNOSIS — R109 Unspecified abdominal pain: Secondary | ICD-10-CM | POA: Diagnosis not present

## 2020-04-02 LAB — BASIC METABOLIC PANEL
BUN: 42 — AB (ref 4–21)
CO2: 27 — AB (ref 13–22)
Chloride: 105 (ref 99–108)
Creatinine: 1.4 — AB (ref 0.5–1.1)
Glucose: 87
Potassium: 4.3 (ref 3.4–5.3)
Sodium: 139 (ref 137–147)

## 2020-04-02 LAB — COMPREHENSIVE METABOLIC PANEL: Calcium: 8.9 (ref 8.7–10.7)

## 2020-04-10 ENCOUNTER — Non-Acute Institutional Stay (SKILLED_NURSING_FACILITY): Payer: Medicare Other | Admitting: Nurse Practitioner

## 2020-04-10 ENCOUNTER — Encounter: Payer: Self-pay | Admitting: Nurse Practitioner

## 2020-04-10 DIAGNOSIS — F0281 Dementia in other diseases classified elsewhere with behavioral disturbance: Secondary | ICD-10-CM

## 2020-04-10 DIAGNOSIS — F418 Other specified anxiety disorders: Secondary | ICD-10-CM | POA: Diagnosis not present

## 2020-04-10 DIAGNOSIS — G309 Alzheimer's disease, unspecified: Secondary | ICD-10-CM | POA: Diagnosis not present

## 2020-04-10 DIAGNOSIS — K5901 Slow transit constipation: Secondary | ICD-10-CM | POA: Diagnosis not present

## 2020-04-10 DIAGNOSIS — I130 Hypertensive heart and chronic kidney disease with heart failure and stage 1 through stage 4 chronic kidney disease, or unspecified chronic kidney disease: Secondary | ICD-10-CM

## 2020-04-10 DIAGNOSIS — N183 Chronic kidney disease, stage 3 unspecified: Secondary | ICD-10-CM

## 2020-04-10 DIAGNOSIS — E039 Hypothyroidism, unspecified: Secondary | ICD-10-CM | POA: Diagnosis not present

## 2020-04-10 NOTE — Assessment & Plan Note (Signed)
Blood pressure is controlled, continue Carvedilol, Lisinopril. Bun/creat 54/1.44 03/26/20, the patient's weight has been stable, she needs encourage oral fluid intake.

## 2020-04-10 NOTE — Progress Notes (Signed)
Location:   [place] Room Number: 105 Place of Service:  SNF (31) Provider:  Marda Stalker, Lennie Odor NP   Virgie Dad, MD  Patient Care Team: Virgie Dad, MD as PCP - General (Internal Medicine) Chase Knebel X, NP as Nurse Practitioner (Internal Medicine) [name], MD as Attending Physician (Neurology)  Extended Emergency Contact Information Primary Emergency Contact: Key,Caroline Address: [place] Phone: [phone] Mobile Phone: [phone] Relation: Daughter Secondary Emergency Contact: Key,Tim  [place] Mobile Phone: [phone] Relation: Son  Code Status:  DNR Goals of care: Advanced Directive information Advanced Directives 04/10/2020  Does Patient Have a Medical Advance Directive? Yes  Type of Paramedic of [place];Living will;Out of facility DNR (pink MOST or yellow form)  Does patient want to make changes to medical advance directive? No - Patient declined  Copy of [place] in Chart? Yes - validated most recent copy scanned in chart (See row information)  Would patient like information on creating a medical advance directive? -  Pre-existing out of facility DNR order (yellow form or pink MOST form) Yellow form placed in chart (order not valid for inpatient use)     Chief Complaint  Patient presents with   Medical Management of Chronic Issues    HPI:  Pt is a 86 y.o. female seen today for medical management of chronic diseases.     Dementia, takes Memantine for memory Depression/anxeity, takesQuetiapine 12.5mg  bid for mood.  HTN, blood pressure iscontrolled on Lisinopril 30mg  qd, Carvedilol 6.25mg  bid. Hypothyroidism, takes Levothyroxine 60mcg qd.  Constipation, takes MiraLax qd, Senokot S  Past Medical History:  Diagnosis Date   Adenomatous colon polyp  07/17/97   Allergic rhinitis    Allergy    Anxiety disorder    Atrial fibrillation (HCC)    Chronic anticoagulation    Diverticulosis    History of shingles    Hypercholesteremia    Hypertension    Hypothyroidism    Internal hemorrhoids    Melanoma ([place])    Memory loss    Meniere disease    Tension headache    Past Surgical History:  Procedure Laterality Date   CATARACT EXTRACTION Right 2012    Allergies  Allergen Reactions   Amitriptyline     Urinary Retention   Amlodipine Swelling   Erythromycin Hives   Nitrofuran Derivatives    Nitrofurantoin Nausea Only   Pseudoephedrine Hcl    Sudafed [Pseudoephedrine Hcl]     Unknown reaction per MAR    Sulfa Antibiotics     "really ill"   Tavist [Clemastine]     Urinary hesitancy   Trimethoprim     Cramps   Verapamil     Constipation    Allergies as of 04/10/2020      Reactions   Amitriptyline    Urinary Retention   Amlodipine Swelling   Erythromycin Hives   Nitrofuran Derivatives    Nitrofurantoin Nausea Only   Pseudoephedrine Hcl    Sudafed [pseudoephedrine Hcl]    Unknown reaction per MAR    Sulfa Antibiotics    "really ill"   Tavist [clemastine]    Urinary hesitancy   Trimethoprim    Cramps   Verapamil    Constipation      Medication List       Accurate as of April 10, 2020  3:47 PM. If you have any  questions, ask your nurse or doctor.        STOP taking these medications   triamcinolone cream 0.5 % Commonly known as: KENALOG Stopped by: Mylan Lengyel X Ebony Yorio, NP     TAKE these medications   acetaminophen 325 MG tablet Commonly known as: TYLENOL Take 325 mg by mouth every 6 (six) hours as needed.   atorvastatin 10 MG tablet Commonly known as: LIPITOR Take 1 tablet (10 mg total) by mouth daily.   Calcium 600 600 MG Tabs tablet Generic drug: calcium carbonate Take 600 mg by mouth 2 (two) times daily with a meal.   carvedilol 6.25 MG tablet Commonly known as: COREG Take  1 tablet (6.25 mg total) by mouth 2 (two) times daily with a meal.   feeding supplement Liqd Take 1 Container by mouth at bedtime. Prefers Vanilla flavored   hydrocortisone 2.5 % rectal cream Commonly known as: ANUSOL-HC Place 1 application rectally 2 (two) times daily as needed for hemorrhoids.   levothyroxine 75 MCG tablet Commonly known as: SYNTHROID Take 75 mcg by mouth daily.   lisinopril 30 MG tablet Commonly known as: ZESTRIL Take 30 mg by mouth daily.   memantine 10 MG tablet Commonly known as: NAMENDA Take 10 mg by mouth 2 (two) times daily.   polyethylene glycol 17 g packet Commonly known as: MIRALAX / GLYCOLAX Take 17 g by mouth daily.   sennosides-docusate sodium 8.6-50 MG tablet Commonly known as: SENOKOT-S Take 2 tablets by mouth at bedtime.   SEROquel 25 MG tablet Generic drug: QUEtiapine Take 12.5 mg by mouth 2 (two) times daily.       Review of Systems  Constitutional: Negative for fatigue, fever and unexpected weight change.  HENT: Positive for hearing loss. Negative for congestion and voice change.   Eyes: Negative for visual disturbance.  Respiratory: Negative for cough.   Cardiovascular: Negative for leg swelling.  Gastrointestinal: Negative for abdominal pain, constipation, nausea and vomiting.       Not sure if she is constipated, c/o abd discomfort, something inside me  Genitourinary: Negative for dysuria and urgency.  Musculoskeletal: Positive for gait problem.  Skin: Negative for color change.  Neurological: Negative for speech difficulty, light-headedness and headaches.       Dementia  Psychiatric/Behavioral: Positive for behavioral problems and confusion. Negative for sleep disturbance. The patient is not nervous/anxious.        Occasional emotional outburst, the last episode was related to constipation.     Immunization History  Administered Date(s) Administered   Influenza Whole 06/30/2018   Influenza, High Dose Seasonal PF  06/30/2019   Influenza-Unspecified 07/19/2017   Moderna SARS-COVID-2 Vaccination 09/30/2019, 10/28/2019   PPD Test 09/05/2016   Pneumococcal Conjugate-13 08/06/2014   Pneumococcal Polysaccharide-23 06/17/2004   Tetanus 07/13/2012   Pertinent  Health Maintenance Due  Topic Date Due   DEXA SCAN  07/23/2021 (Originally 04/28/1992)   INFLUENZA VACCINE  04/28/2020   PNA vac Low Risk Adult  Completed   Fall Risk  06/15/2018 10/14/2017 01/28/2016  Falls in the past year? Yes Yes No  Number falls in past yr: 1 1 -  Injury with Fall? Yes Yes -  Comment pelvic fracture - -  Risk for fall due to : Impaired balance/gait - -   Functional Status Survey:    Vitals:   04/10/20 0926  BP: 132/70  Pulse: 72  Resp: 18  Temp: 97.6 F (36.4 C)  SpO2: 97%  Weight: 124 lb 3.2 oz (56.3 kg)  Height:  5\' 1"  (1.549 m)   Body mass index is 23.47 kg/m. Physical Exam Vitals and nursing note reviewed.  Constitutional:      Appearance: Normal appearance. She is normal weight.     Comments: Crying, fearful  HENT:     Head: Normocephalic and atraumatic.     Mouth/Throat:     Mouth: Mucous membranes are moist.  Eyes:     Extraocular Movements: Extraocular movements intact.     Conjunctiva/sclera: Conjunctivae normal.     Pupils: Pupils are equal, round, and reactive to light.  Cardiovascular:     Rate and Rhythm: Normal rate and regular rhythm.     Heart sounds: No murmur heard.   Pulmonary:     Breath sounds: No rales.  Abdominal:     General: Bowel sounds are normal.     Palpations: Abdomen is soft.     Tenderness: There is no abdominal tenderness.     Comments: Discomfort in general, not sure discomfort location of abd  Musculoskeletal:     Cervical back: Normal range of motion and neck supple.     Right lower leg: No edema.     Left lower leg: No edema.  Skin:    General: Skin is warm and dry.     Findings: No rash.     Comments: A band irritated, scratched area mid left forearm  is resolved.   Neurological:     Mental Status: She is alert. Mental status is at baseline.     Gait: Gait abnormal.     Comments: Oriented to self.   Psychiatric:        Mood and Affect: Mood normal.     Comments: confused, pleasant     Labs reviewed: Recent Labs    08/18/19 0000 12/14/19 0000 02/11/20 0000  NA 142 141 140  K 4.6 4.5 4.3  CL 105 105 104  CO2 27* 30* 30*  BUN 29* 36* 41*  CREATININE 1.0 1.1 1.2*  CALCIUM 9.6 9.6 9.8   Recent Labs    08/18/19 0000  AST 15  ALT 11  ALKPHOS 79   Recent Labs    08/18/19 0000 08/18/19 0000 02/11/20 0000 03/25/20 0000 03/26/20 0000  WBC 7.7   < > 8.3 9.3 9.4  NEUTROABS 4,959  --  6,350  --  7,003  HGB 11.8*   < > 11.2* 11.4* 11.7*  HCT 35*   < > 33* 35* 35*  PLT 231  --  246  --  282   < > = values in this interval not displayed.   Lab Results  Component Value Date   TSH 1.28 08/18/2019   Lab Results  Component Value Date   HGBA1C 5.7 09/30/2017   Lab Results  Component Value Date   CHOL 150 10/17/2019   HDL 76 (A) 10/17/2019   LDLCALC 79 10/17/2019   TRIG 55 10/17/2019    Significant Diagnostic Results in last 30 days:  No results found.  Assessment/Plan Slow transit constipation Stable, continue MiraLax, Senokot S  Hypothyroidism TSH wnl 1.28 08/17/19, continue Levothyroxine.   Alzheimer's dementia with behavioral disturbance Confusion, occasional emotional outburst, continue Memantine for memory  Depression with anxiety Her mood is stable, eats/sleeps at her baseline, continue Quetiapine for mood.   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 Bun/creat 54/1.44 03/26/20, the patient's weight has been stable, she needs encourage oral fluid intake.  Family/ staff Communication: plan of care reviewed with the patient and charge nurse.   Labs/tests ordered:  none  Time spend 25  minutes.

## 2020-04-10 NOTE — Assessment & Plan Note (Signed)
Confusion, occasional emotional outburst, continue Memantine for memory

## 2020-04-10 NOTE — Assessment & Plan Note (Signed)
TSH wnl 1.28 08/17/19, continue Levothyroxine.

## 2020-04-10 NOTE — Assessment & Plan Note (Signed)
Stable, continue MiraLax, Senokot S 

## 2020-04-10 NOTE — Assessment & Plan Note (Addendum)
Her mood is stable, eats/sleeps at her baseline, continue Quetiapine for mood.

## 2020-05-28 ENCOUNTER — Encounter: Payer: Self-pay | Admitting: Nurse Practitioner

## 2020-05-28 ENCOUNTER — Non-Acute Institutional Stay (SKILLED_NURSING_FACILITY): Payer: Medicare Other | Admitting: Nurse Practitioner

## 2020-05-28 DIAGNOSIS — F0281 Dementia in other diseases classified elsewhere with behavioral disturbance: Secondary | ICD-10-CM | POA: Diagnosis not present

## 2020-05-28 DIAGNOSIS — G309 Alzheimer's disease, unspecified: Secondary | ICD-10-CM

## 2020-05-28 DIAGNOSIS — N183 Chronic kidney disease, stage 3 unspecified: Secondary | ICD-10-CM | POA: Diagnosis not present

## 2020-05-28 DIAGNOSIS — K5901 Slow transit constipation: Secondary | ICD-10-CM

## 2020-05-28 DIAGNOSIS — F418 Other specified anxiety disorders: Secondary | ICD-10-CM | POA: Diagnosis not present

## 2020-05-28 DIAGNOSIS — I130 Hypertensive heart and chronic kidney disease with heart failure and stage 1 through stage 4 chronic kidney disease, or unspecified chronic kidney disease: Secondary | ICD-10-CM | POA: Diagnosis not present

## 2020-05-28 DIAGNOSIS — E039 Hypothyroidism, unspecified: Secondary | ICD-10-CM | POA: Diagnosis not present

## 2020-05-28 NOTE — Assessment & Plan Note (Signed)
Her mood is stable, continue Quetiapine, no psychotic issues.

## 2020-05-28 NOTE — Assessment & Plan Note (Signed)
Stable, continue Levothyroxine, TSH wnl 11/20

## 2020-05-28 NOTE — Progress Notes (Signed)
Location:   [facility] Room Number: 105 Place of Service:  SNF (31) Provider: Lennie Odor Nalaya Wojdyla NP  Virgie Dad, MD  Patient Care Team: Virgie Dad, MD as PCP - General (Internal Medicine) Gaelle Adriance X, NP as Nurse Practitioner (Internal Medicine) [name], MD as Attending Physician (Neurology)  Extended Emergency Contact Information Primary Emergency Contact: Key,Caroline Address: [place] Phone: [phone] Mobile Phone: [phone] Relation: Daughter Secondary Emergency Contact: Key,Tim  [place] Mobile Phone: [phone] Relation: Son  Code Status:  DNR Goals of care: Advanced Directive information Advanced Directives 05/28/2020  Does Patient Have a Medical Advance Directive? Yes  Type of Advance Directive Out of facility DNR (pink MOST or yellow form);Living will;Healthcare Power of Attorney  Does patient want to make changes to medical advance directive? No - Patient declined  Copy of [place] in Chart? Yes - validated most recent copy scanned in chart (See row information)  Would patient like information on creating a medical advance directive? -  Pre-existing out of facility DNR order (yellow form or pink MOST form) Yellow form placed in chart (order not valid for inpatient use)     Chief Complaint  Patient presents with  . Medical Management of Chronic Issues    HPI:  Pt is a 86 y.o. female seen today for medical management of chronic diseases.     Dementia, takes Memantine for memory Depression/anxeity, takesQuetiapine 12.5mg  bid for mood.  HTN, blood pressure iscontrolled onLisinopril 30mg  qd, Carvedilol 6.25mg  bid. Hypothyroidism, takes Levothyroxine 54mcg qd.  Constipation, takes MiraLax qd, Senokot S        Past Medical History:  Diagnosis Date  . Adenomatous colon  polyp 07/17/97  . Allergic rhinitis   . Allergy   . Anxiety disorder   . Atrial fibrillation ([place])   . Chronic anticoagulation   . Diverticulosis   . History of shingles   . Hypercholesteremia   . Hypertension   . Hypothyroidism   . Internal hemorrhoids   . Melanoma ([place])   . Memory loss   . Meniere disease   . Tension headache    Past Surgical History:  Procedure Laterality Date  . CATARACT EXTRACTION Right 2012    Allergies  Allergen Reactions  . Amitriptyline     Urinary Retention  . Amlodipine Swelling  . Erythromycin Hives  . Nitrofuran Derivatives   . Nitrofurantoin Nausea Only  . Pseudoephedrine Hcl   . Sudafed [Pseudoephedrine Hcl]     Unknown reaction per MAR   . Sulfa Antibiotics     "really ill"  . Tavist [Clemastine]     Urinary hesitancy  . Trimethoprim     Cramps  . Verapamil     Constipation    Allergies as of 05/28/2020      Reactions   Amitriptyline    Urinary Retention   Amlodipine Swelling   Erythromycin Hives   Nitrofuran Derivatives    Nitrofurantoin Nausea Only   Pseudoephedrine Hcl    Sudafed [pseudoephedrine Hcl]    Unknown reaction per MAR    Sulfa Antibiotics    "really ill"   Tavist [clemastine]    Urinary hesitancy   Trimethoprim    Cramps   Verapamil    Constipation      Medication List       Accurate as of May 28, 2020 11:59 PM. If you  have any questions, ask your nurse or doctor.        acetaminophen 325 MG tablet Commonly known as: TYLENOL Take 325 mg by mouth every 6 (six) hours as needed.   atorvastatin 10 MG tablet Commonly known as: LIPITOR Take 1 tablet (10 mg total) by mouth daily.   Calcium 600 600 MG Tabs tablet Generic drug: calcium carbonate Take 600 mg by mouth 2 (two) times daily with a meal.   carvedilol 6.25 MG tablet Commonly known as: COREG Take 1 tablet (6.25 mg total) by mouth 2 (two) times daily with a meal.   feeding supplement Liqd Take 1 Container by mouth at bedtime.  Prefers Vanilla flavored   hydrocortisone 2.5 % rectal cream Commonly known as: ANUSOL-HC Place 1 application rectally 2 (two) times daily as needed for hemorrhoids.   levothyroxine 75 MCG tablet Commonly known as: SYNTHROID Take 75 mcg by mouth daily.   lisinopril 30 MG tablet Commonly known as: ZESTRIL Take 30 mg by mouth daily.   memantine 10 MG tablet Commonly known as: NAMENDA Take 10 mg by mouth 2 (two) times daily.   polyethylene glycol 17 g packet Commonly known as: MIRALAX / GLYCOLAX Take 17 g by mouth daily.   sennosides-docusate sodium 8.6-50 MG tablet Commonly known as: SENOKOT-S Take 2 tablets by mouth at bedtime.   SEROquel 25 MG tablet Generic drug: QUEtiapine Take 12.5 mg by mouth 2 (two) times daily.       Review of Systems  Constitutional: Negative for activity change, appetite change, fever and unexpected weight change.  HENT: Positive for hearing loss. Negative for congestion and voice change.   Eyes: Negative for visual disturbance.  Respiratory: Negative for cough.   Cardiovascular: Negative for leg swelling.  Gastrointestinal: Negative for abdominal pain and constipation.       Not sure if she is constipated, c/o abd discomfort, something inside me  Genitourinary: Negative for dysuria and urgency.  Musculoskeletal: Positive for gait problem.  Skin: Negative for color change.  Neurological: Negative for dizziness and speech difficulty.       Dementia  Psychiatric/Behavioral: Positive for confusion. Negative for behavioral problems and sleep disturbance. The patient is not nervous/anxious.     Immunization History  Administered Date(s) Administered  . Influenza Whole 06/30/2018  . Influenza, High Dose Seasonal PF 06/30/2019  . Influenza-Unspecified 07/19/2017  . Moderna SARS-COVID-2 Vaccination 09/30/2019, 10/28/2019  . PPD Test 09/05/2016  . Pneumococcal Conjugate-13 08/06/2014  . Pneumococcal Polysaccharide-23 06/17/2004  . Tetanus  07/13/2012   Pertinent  Health Maintenance Due  Topic Date Due  . INFLUENZA VACCINE  04/28/2020  . DEXA SCAN  07/23/2021 (Originally 04/28/1992)  . PNA vac Low Risk Adult  Completed   Fall Risk  06/15/2018 10/14/2017 01/28/2016  Falls in the past year? Yes Yes No  Number falls in past yr: 1 1 -  Injury with Fall? Yes Yes -  Comment pelvic fracture - -  Risk for fall due to : Impaired balance/gait - -   Functional Status Survey:    Vitals:   05/28/20 1359  BP: 102/62  Pulse: 68  Resp: 18  Temp: 98.1 F (36.7 C)  SpO2: 95%  Weight: 124 lb 12.8 oz (56.6 kg)  Height: 5\' 1"  (1.549 m)   Body mass index is 23.58 kg/m. Physical Exam Vitals and nursing note reviewed.  Constitutional:      Appearance: Normal appearance. She is normal weight.     Comments: Crying, fearful  HENT:  Head: Normocephalic and atraumatic.     Mouth/Throat:     Mouth: Mucous membranes are moist.  Eyes:     Extraocular Movements: Extraocular movements intact.     Conjunctiva/sclera: Conjunctivae normal.     Pupils: Pupils are equal, round, and reactive to light.  Cardiovascular:     Rate and Rhythm: Normal rate and regular rhythm.     Heart sounds: No murmur heard.   Pulmonary:     Breath sounds: No rales.  Abdominal:     General: Bowel sounds are normal.     Palpations: Abdomen is soft.     Tenderness: There is no abdominal tenderness. There is no guarding.  Musculoskeletal:     Cervical back: Normal range of motion and neck supple.     Right lower leg: No edema.     Left lower leg: No edema.  Skin:    General: Skin is warm and dry.  Neurological:     Mental Status: She is alert. Mental status is at baseline.     Gait: Gait abnormal.     Comments: Oriented to self.   Psychiatric:        Mood and Affect: Mood normal.     Comments: confused     Labs reviewed: Recent Labs    12/14/19 0000 02/11/20 0000 04/02/20 0000  NA 141 140 139  K 4.5 4.3 4.3  CL 105 104 105  CO2 30* 30* 27*    BUN 36* 41* 42*  CREATININE 1.1 1.2* 1.4*  CALCIUM 9.6 9.8 8.9   Recent Labs    08/18/19 0000  AST 15  ALT 11  ALKPHOS 79   Recent Labs    08/18/19 0000 08/18/19 0000 02/11/20 0000 03/25/20 0000 03/26/20 0000  WBC 7.7   < > 8.3 9.3 9.4  NEUTROABS 4,959  --  6,350  --  7,003  HGB 11.8*   < > 11.2* 11.4* 11.7*  HCT 35*   < > 33* 35* 35*  PLT 231  --  246  --  282   < > = values in this interval not displayed.   Lab Results  Component Value Date   TSH 1.28 08/18/2019   Lab Results  Component Value Date   HGBA1C 5.7 09/30/2017   Lab Results  Component Value Date   CHOL 150 10/17/2019   HDL 76 (A) 10/17/2019   LDLCALC 79 10/17/2019   TRIG 55 10/17/2019    Significant Diagnostic Results in last 30 days:  No results found.  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   Slow transit constipation Stable, continue MiraLax, Senokot S  Hypothyroidism Stable, continue Levothyroxine, TSH wnl 11/20  Alzheimer's dementia with behavioral disturbance Stable, continue Memantine for memory, supportive care in memory care unit [facility]  Depression with anxiety Her mood is stable, continue Quetiapine, no psychotic issues.    Family/ staff Communication: plan of care reviewed with the patient and charge nurse.   Labs/tests ordered:  none  Time spend 25 minutes.

## 2020-05-28 NOTE — Assessment & Plan Note (Signed)
Stable, continue MiraLax, Senokot S 

## 2020-05-28 NOTE — Assessment & Plan Note (Signed)
Blood pressure is controlled, continue Lisinopril, Carvedilol.

## 2020-05-28 NOTE — Assessment & Plan Note (Signed)
Stable, continue Memantine for memory, supportive care in memory care unit [facility]

## 2020-05-29 ENCOUNTER — Encounter: Payer: Self-pay | Admitting: Nurse Practitioner

## 2020-06-13 ENCOUNTER — Encounter: Payer: Self-pay | Admitting: Internal Medicine

## 2020-06-13 ENCOUNTER — Non-Acute Institutional Stay (SKILLED_NURSING_FACILITY): Payer: Medicare Other | Admitting: Internal Medicine

## 2020-06-13 DIAGNOSIS — F0281 Dementia in other diseases classified elsewhere with behavioral disturbance: Secondary | ICD-10-CM

## 2020-06-13 DIAGNOSIS — E782 Mixed hyperlipidemia: Secondary | ICD-10-CM

## 2020-06-13 DIAGNOSIS — E039 Hypothyroidism, unspecified: Secondary | ICD-10-CM | POA: Diagnosis not present

## 2020-06-13 DIAGNOSIS — G309 Alzheimer's disease, unspecified: Secondary | ICD-10-CM | POA: Diagnosis not present

## 2020-06-13 DIAGNOSIS — I480 Paroxysmal atrial fibrillation: Secondary | ICD-10-CM | POA: Diagnosis not present

## 2020-06-13 DIAGNOSIS — I10 Essential (primary) hypertension: Secondary | ICD-10-CM

## 2020-06-13 NOTE — Progress Notes (Signed)
Location:  [place] Room Number: 105 Place of Service:  SNF [phone])  Provider: Veleta Miners MD  Code Status: DNR Goals of Care:  Advanced Directives 06/13/2020  Does Patient Have a Medical Advance Directive? Yes  Type of Advance Directive Out of facility DNR (pink MOST or yellow form);Living will;Healthcare Power of Attorney  Does patient want to make changes to medical advance directive? No - Patient declined  Copy of [place] in Chart? Yes - validated most recent copy scanned in chart (See row information)  Would patient like information on creating a medical advance directive? -  Pre-existing out of facility DNR order (yellow form or pink MOST form) Yellow form placed in chart (order not valid for inpatient use)     Chief Complaint  Patient presents with   Medical Management of Chronic Issues    HPI: Patient is a 86 y.o. female seen today for medical management of chronic diseases.   She has h/o Dementia with Behavior issues, Hypertension, Hypothyrodism, PAF not on Any anticoagulation, Hyperlipemia She is long term Resident of Memory Unit.  Weight Stable No New Nursing Issues. Has Aphasia and gets Anxious very easily   Past Medical History:  Diagnosis Date   Adenomatous colon polyp 07/17/97   Allergic rhinitis    Allergy    Anxiety disorder    Atrial fibrillation (HCC)    Chronic anticoagulation    Diverticulosis    History of shingles    Hypercholesteremia    Hypertension    Hypothyroidism    Internal hemorrhoids    Melanoma ([place])    Memory loss    Meniere disease    Tension headache     Past Surgical History:  Procedure Laterality Date   CATARACT EXTRACTION Right 2012    Allergies  Allergen Reactions   Amitriptyline     Urinary Retention   Amlodipine Swelling   Erythromycin Hives   Nitrofuran Derivatives    Nitrofurantoin Nausea Only   Pseudoephedrine Hcl    Sudafed  [Pseudoephedrine Hcl]     Unknown reaction per MAR    Sulfa Antibiotics     "really ill"   Tavist [Clemastine]     Urinary hesitancy   Trimethoprim     Cramps   Verapamil     Constipation    Outpatient Encounter Medications as of 06/13/2020  Medication Sig   acetaminophen (TYLENOL) 325 MG tablet Take 325 mg by mouth every 6 (six) hours as needed.    atorvastatin (LIPITOR) 10 MG tablet Take 1 tablet (10 mg total) by mouth daily.   calcium carbonate (CALCIUM 600) 600 MG TABS tablet Take 600 mg by mouth 2 (two) times daily with a meal.   carvedilol (COREG) 6.25 MG tablet Take 1 tablet (6.25 mg total) by mouth 2 (two) times daily with a meal.   feeding supplement (BOOST / RESOURCE BREEZE) LIQD Take 1 Container by mouth at bedtime. Prefers Vanilla flavored   hydrocortisone (ANUSOL-HC) 2.5 % rectal cream Place 1 application rectally 2 (two) times daily as needed for hemorrhoids.   levothyroxine (SYNTHROID, LEVOTHROID) 75 MCG tablet Take 75 mcg by mouth daily.   lisinopril (ZESTRIL) 30 MG tablet Take 30 mg by mouth daily.    memantine (NAMENDA) 10 MG tablet Take 10 mg by mouth 2 (two) times daily.   polyethylene glycol (MIRALAX / GLYCOLAX) 17 g packet Take 17 g by mouth daily.   QUEtiapine (SEROQUEL) 25 MG tablet Take 12.5 mg by mouth  2 (two) times daily.    sennosides-docusate sodium (SENOKOT-S) 8.6-50 MG tablet Take 2 tablets by mouth at bedtime.   No facility-administered encounter medications on file as of 06/13/2020.    Review of Systems:  Review of Systems  Unable to perform ROS: Dementia    Health Maintenance  Topic Date Due   INFLUENZA VACCINE  04/28/2020   DEXA SCAN  07/23/2021 (Originally 04[birth year]28/1992)   TETANUS/TDAP  07/13/2022   COVID-19 Vaccine  Completed   PNA vac Low Risk Adult  Completed    Physical Exam: Vitals:   06/13/20 1313  BP: 120/60  Pulse: 72  Resp: 20  Temp: (!) 97.2 F (36.2 C)  SpO2: 95%  Weight: 123 lb (55.8 kg)  Height: 5\' 1"   (1.549 m)   Body mass index is 23.24 kg/m. Physical Exam Vitals reviewed.  Constitutional:      Appearance: Normal appearance.  HENT:     Head: Normocephalic.     Nose: Nose normal.     Mouth/Throat:     Mouth: Mucous membranes are moist.     Pharynx: Oropharynx is clear.  Eyes:     Pupils: Pupils are equal, round, and reactive to light.  Cardiovascular:     Rate and Rhythm: Normal rate and regular rhythm.     Pulses: Normal pulses.  Pulmonary:     Effort: Pulmonary effort is normal.     Breath sounds: Normal breath sounds.  Abdominal:     General: Abdomen is flat. Bowel sounds are normal.     Palpations: Abdomen is soft.  Musculoskeletal:        General: No swelling.     Cervical back: Neck supple.  Skin:    General: Skin is warm.  Neurological:     General: No focal deficit present.     Mental Status: She is alert.     Comments: Not Oriented. Walks with her walker  Psychiatric:        Mood and Affect: Mood normal.     Labs reviewed: Basic Metabolic Panel: Recent Labs    08/18/19 0000 08/18/19 0000 12/14/19 0000 02/11/20 0000 04/02/20 0000  NA 142   < > 141 140 139  K 4.6   < > 4.5 4.3 4.3  CL 105   < > 105 104 105  CO2 27*   < > 30* 30* 27*  BUN 29*   < > 36* 41* 42*  CREATININE 1.0   < > 1.1 1.2* 1.4*  CALCIUM 9.6   < > 9.6 9.8 8.9  TSH 1.28  --   --   --   --    < > = values in this interval not displayed.   Liver Function Tests: Recent Labs    08/18/19 0000  AST 15  ALT 11  ALKPHOS 79   No results for input(s): LIPASE, AMYLASE in the last 8760 hours. No results for input(s): AMMONIA in the last 8760 hours. CBC: Recent Labs    08/18/19 0000 08/18/19 0000 02/11/20 0000 03/25/20 0000 03/26/20 0000  WBC 7.7   < > 8.3 9.3 9.4  NEUTROABS 4,959  --  6,350  --  7,003  HGB 11.8*   < > 11.2* 11.4* 11.7*  HCT 35*   < > 33* 35* 35*  PLT 231  --  246  --  282   < > = values in this interval not displayed.   Lipid Panel: Recent Labs     10/17/19 0000  CHOL  150  HDL 76*  LDLCALC 79  TRIG 55   Lab Results  Component Value Date   HGBA1C 5.7 09/30/2017    Procedures since last visit: No results found.  Assessment/Plan  Essential hypertension Continue on Lisinopril  Hypothyroidism, unspecified type Repeat TSH Paroxysmal atrial fibrillation (HCC) Continue Coreg  No Anticoagulation due to Falls Alzheimer's dementia with behavioral disturbance,  (HCC) Namenda and Seroquel for Behaviors No GDR right now  Mixed hyperlipidemia Lipitor Low dose LDL 79 in 1/21  CKD Repeat BMP  Labs/tests ordered:  BMP,CBC,TSh

## 2020-06-18 DIAGNOSIS — E785 Hyperlipidemia, unspecified: Secondary | ICD-10-CM | POA: Diagnosis not present

## 2020-06-18 DIAGNOSIS — I10 Essential (primary) hypertension: Secondary | ICD-10-CM | POA: Diagnosis not present

## 2020-06-18 DIAGNOSIS — E039 Hypothyroidism, unspecified: Secondary | ICD-10-CM | POA: Diagnosis not present

## 2020-06-19 LAB — CBC AND DIFFERENTIAL
HCT: 32 — AB (ref 36–46)
Hemoglobin: 10.9 — AB (ref 12.0–16.0)
Neutrophils Absolute: 4662
Platelets: 315 (ref 150–399)
WBC: 7.4

## 2020-06-19 LAB — COMPREHENSIVE METABOLIC PANEL
Albumin: 4.2 (ref 3.5–5.0)
Calcium: 9.8 (ref 8.7–10.7)
Globulin: 2.5

## 2020-06-19 LAB — HEPATIC FUNCTION PANEL
ALT: 17 (ref 7–35)
AST: 18 (ref 13–35)
Alkaline Phosphatase: 94 (ref 25–125)
Bilirubin, Total: 0.6

## 2020-06-19 LAB — BASIC METABOLIC PANEL
BUN: 67 — AB (ref 4–21)
CO2: 24 — AB (ref 13–22)
Chloride: 102 (ref 99–108)
Creatinine: 2 — AB (ref 0.5–1.1)
Glucose: 99
Potassium: 5 (ref 3.4–5.3)
Sodium: 138 (ref 137–147)

## 2020-06-19 LAB — CBC: RBC: 3.47 — AB (ref 3.87–5.11)

## 2020-06-19 LAB — TSH: TSH: 3.69 (ref 0.41–5.90)

## 2020-06-24 ENCOUNTER — Encounter: Payer: Self-pay | Admitting: Internal Medicine

## 2020-06-24 ENCOUNTER — Non-Acute Institutional Stay (SKILLED_NURSING_FACILITY): Payer: Medicare Other | Admitting: Internal Medicine

## 2020-06-24 DIAGNOSIS — I480 Paroxysmal atrial fibrillation: Secondary | ICD-10-CM | POA: Diagnosis not present

## 2020-06-24 DIAGNOSIS — E782 Mixed hyperlipidemia: Secondary | ICD-10-CM | POA: Diagnosis not present

## 2020-06-24 DIAGNOSIS — N1832 Chronic kidney disease, stage 3b: Secondary | ICD-10-CM | POA: Diagnosis not present

## 2020-06-24 DIAGNOSIS — G309 Alzheimer's disease, unspecified: Secondary | ICD-10-CM | POA: Diagnosis not present

## 2020-06-24 DIAGNOSIS — I10 Essential (primary) hypertension: Secondary | ICD-10-CM | POA: Diagnosis not present

## 2020-06-24 DIAGNOSIS — E039 Hypothyroidism, unspecified: Secondary | ICD-10-CM

## 2020-06-24 DIAGNOSIS — F0281 Dementia in other diseases classified elsewhere with behavioral disturbance: Secondary | ICD-10-CM | POA: Diagnosis not present

## 2020-06-24 NOTE — Progress Notes (Signed)
Location:   [place] Room Number: 105 Place of Service:  SNF [phone]) Provider:  Veleta Miners MD  Virgie Dad, MD  Patient Care Team: Virgie Dad, MD as PCP - General (Internal Medicine) Mast, Man X, NP as Nurse Practitioner (Internal Medicine) [name], MD as Attending Physician (Neurology)  Extended Emergency Contact Information Primary Emergency Contact: Key,Caroline Address: [place] [employer] [place] Phone: [phone] Mobile Phone: [phone] Relation: Daughter Secondary Emergency Contact: Key,Tim  [place] Mobile Phone: [phone] Relation: Son  Code Status:  DNR Goals of care: Advanced Directive information Advanced Directives 06/13/2020  Does Patient Have a Medical Advance Directive? Yes  Type of Advance Directive Out of facility DNR (pink MOST or yellow form);Living will;Healthcare Power of Attorney  Does patient want to make changes to medical advance directive? No - Patient declined  Copy of [place] in Chart? Yes - validated most recent copy scanned in chart (See row information)  Would patient like information on creating a medical advance directive? -  Pre-existing out of facility DNR order (yellow form or pink MOST form) Yellow form placed in chart (order not valid for inpatient use)     Chief Complaint  Patient presents with  . Acute Visit    Change in behavior     HPI:  Pt is a 86 y.o. female seen today for an acute visit for Change in behaviors  She has h/o Dementia with Behavior issues, Hypertension, Hypothyrodism, PAF not on Any anticoagulation, Hyperlipemia She is long term Resident of Memory Unit.  Recently for past few days having behaviors issues Including agitation crying slamming her walker. She told me she is upset as she is stuck here. And she is depressed Doe shave h/o Aphasia  Past Medical History:  Diagnosis  Date  . Adenomatous colon polyp 07/17/97  . Allergic rhinitis   . Allergy   . Anxiety disorder   . Atrial fibrillation ([place])   . Chronic anticoagulation   . Diverticulosis   . History of shingles   . Hypercholesteremia   . Hypertension   . Hypothyroidism   . Internal hemorrhoids   . Melanoma ([place])   . Memory loss   . Meniere disease   . Tension headache    Past Surgical History:  Procedure Laterality Date  . CATARACT EXTRACTION Right 2012    Allergies  Allergen Reactions  . Amitriptyline     Urinary Retention  . Amlodipine Swelling  . Erythromycin Hives  . Nitrofuran Derivatives   . Nitrofurantoin Nausea Only  . Pseudoephedrine Hcl   . Sudafed [Pseudoephedrine Hcl]     Unknown reaction per MAR   . Sulfa Antibiotics     "really ill"  . Tavist [Clemastine]     Urinary hesitancy  . Trimethoprim     Cramps  . Verapamil     Constipation    Allergies as of 06/24/2020      Reactions   Amitriptyline    Urinary Retention   Amlodipine Swelling   Erythromycin Hives   Nitrofuran Derivatives    Nitrofurantoin Nausea Only   Pseudoephedrine Hcl    Sudafed [pseudoephedrine Hcl]    Unknown reaction per MAR    Sulfa Antibiotics    "really ill"   Tavist [clemastine]    Urinary hesitancy   Trimethoprim    Cramps   Verapamil    Constipation  Medication List       Accurate as of June 24, 2020  2:43 PM. If you have any questions, ask your nurse or doctor.        acetaminophen 325 MG tablet Commonly known as: TYLENOL Take 325 mg by mouth every 6 (six) hours as needed.   atorvastatin 10 MG tablet Commonly known as: LIPITOR Take 1 tablet (10 mg total) by mouth daily.   Calcium 600 600 MG Tabs tablet Generic drug: calcium carbonate Take 600 mg by mouth 2 (two) times daily with a meal.   carvedilol 6.25 MG tablet Commonly known as: COREG Take 1 tablet (6.25 mg total) by mouth 2 (two) times daily with a meal.   feeding supplement Liqd Take 1  Container by mouth at bedtime. Prefers Vanilla flavored   hydrocortisone 2.5 % rectal cream Commonly known as: ANUSOL-HC Place 1 application rectally 2 (two) times daily as needed for hemorrhoids.   levothyroxine 75 MCG tablet Commonly known as: SYNTHROID Take 75 mcg by mouth daily.   lisinopril 10 MG tablet Commonly known as: ZESTRIL Take 10 mg by mouth daily.   memantine 10 MG tablet Commonly known as: NAMENDA Take 10 mg by mouth 2 (two) times daily.   polyethylene glycol 17 g packet Commonly known as: MIRALAX / GLYCOLAX Take 17 g by mouth daily.   sennosides-docusate sodium 8.6-50 MG tablet Commonly known as: SENOKOT-S Take 2 tablets by mouth at bedtime.   SEROquel 25 MG tablet Generic drug: QUEtiapine Take 12.5 mg by mouth 2 (two) times daily.       Review of Systems  Constitutional: Negative.   HENT: Negative.   Respiratory: Negative.   Cardiovascular: Negative.   Gastrointestinal: Negative.   Genitourinary: Negative.   Musculoskeletal: Negative.   Neurological: Negative.   Psychiatric/Behavioral: Positive for agitation, behavioral problems, confusion and dysphoric mood.  All other systems reviewed and are negative.   Immunization History  Administered Date(s) Administered  . Influenza Whole 06/30/2018  . Influenza, High Dose Seasonal PF 06/30/2019  . Influenza-Unspecified 07/19/2017  . Moderna SARS-COVID-2 Vaccination 09/30/2019, 10/28/2019  . PPD Test 09/05/2016  . Pneumococcal Conjugate-13 08/06/2014  . Pneumococcal Polysaccharide-23 06/17/2004  . Tetanus 07/13/2012   Pertinent  Health Maintenance Due  Topic Date Due  . INFLUENZA VACCINE  04/28/2020  . DEXA SCAN  07/23/2021 (Originally 04/28/1990)  . PNA vac Low Risk Adult  Completed   Fall Risk  06/15/2018 10/14/2017 01/28/2016  Falls in the past year? Yes Yes No  Number falls in past yr: 1 1 -  Injury with Fall? Yes Yes -  Comment pelvic fracture - -  Risk for fall due to : Impaired balance/gait  - -   Functional Status Survey:    Vitals:   06/24/20 1438  BP: (!) 96/50  Pulse: 76  Resp: 18  Temp: 98.3 F (36.8 C)  SpO2: 96%  Weight: 123 lb (55.8 kg)  Height: 5\' 1"  (1.549 m)   Body mass index is 23.24 kg/m. Physical Exam Vitals reviewed.  Constitutional:      Appearance: Normal appearance.  HENT:     Head: Normocephalic.     Nose: Nose normal.     Mouth/Throat:     Mouth: Mucous membranes are moist.     Pharynx: Oropharynx is clear.  Eyes:     Pupils: Pupils are equal, round, and reactive to light.  Cardiovascular:     Rate and Rhythm: Normal rate and regular rhythm.     Pulses: Normal  pulses.  Pulmonary:     Effort: Pulmonary effort is normal.     Breath sounds: Normal breath sounds.  Abdominal:     General: Abdomen is flat. Bowel sounds are normal.     Palpations: Abdomen is soft.  Musculoskeletal:        General: No swelling.  Skin:    General: Skin is warm.  Neurological:     General: No focal deficit present.     Mental Status: She is alert.  Psychiatric:     Comments: Clearly upset and Depressed     Labs reviewed: Recent Labs    02/11/20 0000 04/02/20 0000 06/19/20 0000  NA 140 139 138  K 4.3 4.3 5.0  CL 104 105 102  CO2 30* 27* 24*  BUN 41* 42* 67*  CREATININE 1.2* 1.4* 2.0*  CALCIUM 9.8 8.9 9.8   Recent Labs    08/18/19 0000 06/19/20 0000  AST 15 18  ALT 11 17  ALKPHOS 79 94  ALBUMIN  --  4.2   Recent Labs    02/11/20 0000 02/11/20 0000 03/25/20 0000 03/26/20 0000 06/19/20 0000  WBC 8.3   < > 9.3 9.4 7.4  NEUTROABS 6,350  --   --  7,003 4,662  HGB 11.2*   < > 11.4* 11.7* 10.9*  HCT 33*   < > 35* 35* 32*  PLT 246  --   --  282 315   < > = values in this interval not displayed.   Lab Results  Component Value Date   TSH 3.69 06/19/2020   Lab Results  Component Value Date   HGBA1C 5.7 09/30/2017   Lab Results  Component Value Date   CHOL 150 10/17/2019   HDL 76 (A) 10/17/2019   LDLCALC 79 10/17/2019   TRIG  55 10/17/2019    Significant Diagnostic Results in last 30 days:  No results found.  Assessment/Plan Essential hypertension BP running low Discontinue lisinopril Alzheimer's dementia with behavioral disturbance, unspecified timing of dementia onset ([place]) ? Depression Will start her on Zoloft 12.5 mg QD for 1 week and then 25 mg  Per notes from past she has failed Zoloft before but will try again Increase the Seroquel to 25 mg BID Hold for sedation ON Namenda already CKD stage 4 Recent worsening of renal function Discontinue Lisinopril Repeat BMP in few days Anemia Most likely due to CKD Will consider Iron later Hypothyroidism, unspecified type TSH normal in 9/21 Paroxysmal atrial fibrillation (HCC) On Coreg No ANticoagulation Mixed hyperlipidemia On Lipitor LDL 79 in 1/21     Family/ staff Communication:   Labs/tests ordered:  BMP in 2 weeks

## 2020-06-28 DIAGNOSIS — I10 Essential (primary) hypertension: Secondary | ICD-10-CM | POA: Diagnosis not present

## 2020-06-29 ENCOUNTER — Telehealth: Payer: Self-pay | Admitting: Adult Health

## 2020-06-29 LAB — CBC AND DIFFERENTIAL
HCT: 31 — AB (ref 36–46)
Hemoglobin: 10.3 — AB (ref 12.0–16.0)
WBC: 6.3

## 2020-06-29 LAB — COMPREHENSIVE METABOLIC PANEL: Calcium: 9.6 (ref 8.7–10.7)

## 2020-06-29 LAB — BASIC METABOLIC PANEL
BUN: 85 — AB (ref 4–21)
CO2: 23 — AB (ref 13–22)
Chloride: 104 (ref 99–108)
Creatinine: 2.4 — AB (ref 0.5–1.1)
Glucose: 116
Potassium: 5.5 — AB (ref 3.4–5.3)
Sodium: 135 — AB (ref 137–147)

## 2020-06-29 LAB — CBC: RBC: 3.35 — AB (ref 3.87–5.11)

## 2020-06-29 NOTE — Telephone Encounter (Signed)
Talked to the daughter. They have decided to keep her in facility. Lipitor and Calcium discontinued. Will continue rest of the  Meds for her behaviors. Encourage PO fluids. Repeat BMP on MON

## 2020-06-29 NOTE — Telephone Encounter (Signed)
Received a call from the nurse at [facility] in the memory care unit. Stat labs were drawn on 10/1 and just turned. K was 5.5, BUN 85, Cr 2.37.    Pt has advancing dementia with combative behaviors. She was seen by Dr. Lyndel Safe on 9/27 and lisinopril discontinued due to low bp. The nurse is certain we would not be able to get an IV in this resident due to her combative behavior. She is not eating and drinking well and can not be encouraged to hydrate orally. I told the nurse that we should lean towards comfort care for this resident given her dementia, age, and code status and avoid hospitalization. The nurse called the [facility] and they are thinking about this and will call [place] back with a decision later.

## 2020-07-01 ENCOUNTER — Encounter: Payer: Self-pay | Admitting: Nurse Practitioner

## 2020-07-01 ENCOUNTER — Non-Acute Institutional Stay (SKILLED_NURSING_FACILITY): Payer: Medicare Other | Admitting: Nurse Practitioner

## 2020-07-01 DIAGNOSIS — D649 Anemia, unspecified: Secondary | ICD-10-CM

## 2020-07-01 DIAGNOSIS — K5901 Slow transit constipation: Secondary | ICD-10-CM | POA: Diagnosis not present

## 2020-07-01 DIAGNOSIS — G309 Alzheimer's disease, unspecified: Secondary | ICD-10-CM

## 2020-07-01 DIAGNOSIS — I480 Paroxysmal atrial fibrillation: Secondary | ICD-10-CM | POA: Diagnosis not present

## 2020-07-01 DIAGNOSIS — N183 Chronic kidney disease, stage 3 unspecified: Secondary | ICD-10-CM

## 2020-07-01 DIAGNOSIS — N179 Acute kidney failure, unspecified: Secondary | ICD-10-CM | POA: Diagnosis not present

## 2020-07-01 DIAGNOSIS — E039 Hypothyroidism, unspecified: Secondary | ICD-10-CM | POA: Diagnosis not present

## 2020-07-01 DIAGNOSIS — F0281 Dementia in other diseases classified elsewhere with behavioral disturbance: Secondary | ICD-10-CM | POA: Diagnosis not present

## 2020-07-01 DIAGNOSIS — F418 Other specified anxiety disorders: Secondary | ICD-10-CM

## 2020-07-01 DIAGNOSIS — I130 Hypertensive heart and chronic kidney disease with heart failure and stage 1 through stage 4 chronic kidney disease, or unspecified chronic kidney disease: Secondary | ICD-10-CM | POA: Diagnosis not present

## 2020-07-01 LAB — BASIC METABOLIC PANEL
BUN: 61 — AB (ref 4–21)
CO2: 21 (ref 13–22)
Chloride: 105 (ref 99–108)
Creatinine: 1.7 — AB (ref 0.5–1.1)
Glucose: 97
Potassium: 4.9 (ref 3.4–5.3)
Sodium: 137 (ref 137–147)

## 2020-07-01 LAB — COMPREHENSIVE METABOLIC PANEL
Calcium: 9.3 (ref 8.7–10.7)
GFR calc Af Amer: 30
GFR calc non Af Amer: 26

## 2020-07-01 NOTE — Progress Notes (Signed)
Location:   [place] Room Number: 105 Place of Service:  SNF [phone]) Provider:  Veleta Miners MD  Virgie Dad, MD  Patient Care Team: Virgie Dad, MD as PCP - General (Internal Medicine) Mast, Man X, NP as Nurse Practitioner (Internal Medicine) [name], MD as Attending Physician (Neurology)  Extended Emergency Contact Information Primary Emergency Contact: Key,Caroline Address: [place] Phone: [phone] Mobile Phone: [phone] Relation: Daughter Secondary Emergency Contact: Key,Tim  [place] Mobile Phone: [phone] Relation: Son  Code Status:  DNR Goals of care: Advanced Directive information Advanced Directives 06/13/2020  Does Patient Have a Medical Advance Directive? Yes  Type of Advance Directive Out of facility DNR (pink MOST or yellow form);Living will;Healthcare Power of Attorney  Does patient want to make changes to medical advance directive? No - Patient declined  Copy of [place] in Chart? Yes - validated most recent copy scanned in chart (See row information)  Would patient like information on creating a medical advance directive? -  Pre-existing out of facility DNR order (yellow form or pink MOST form) Yellow form placed in chart (order not valid for inpatient use)     Chief Complaint  Patient presents with   Acute Visit    Acute renal injury    HPI:  Pt is a 86 y.o. female seen today for an acute visit for    Past Medical History:  Diagnosis Date   Adenomatous colon polyp 07/17/97   Allergic rhinitis    Allergy    Anxiety disorder    Atrial fibrillation (HCC)    Chronic anticoagulation    Diverticulosis    History of shingles    Hypercholesteremia    Hypertension    Hypothyroidism    Internal hemorrhoids    Melanoma ([place])    Memory loss    Meniere disease    Tension headache     Past Surgical History:  Procedure Laterality Date   CATARACT EXTRACTION Right 2012    Allergies  Allergen Reactions   Amitriptyline     Urinary Retention   Amlodipine Swelling   Erythromycin Hives   Nitrofuran Derivatives    Nitrofurantoin Nausea Only   Pseudoephedrine Hcl    Sudafed [Pseudoephedrine Hcl]     Unknown reaction per MAR    Sulfa Antibiotics     "really ill"   Tavist [Clemastine]     Urinary hesitancy   Trimethoprim     Cramps   Verapamil     Constipation    Allergies as of 07/01/2020      Reactions   Amitriptyline    Urinary Retention   Amlodipine Swelling   Erythromycin Hives   Nitrofuran Derivatives    Nitrofurantoin Nausea Only   Pseudoephedrine Hcl    Sudafed [pseudoephedrine Hcl]    Unknown reaction per MAR    Sulfa Antibiotics    "really ill"   Tavist [clemastine]    Urinary hesitancy   Trimethoprim    Cramps   Verapamil    Constipation      Medication List       Accurate as of July 01, 2020 10:37 AM. If you have any questions, ask your nurse or doctor.        STOP taking these medications   atorvastatin 10 MG tablet Commonly known as: LIPITOR Stopped by: Man X Mast, NP   Calcium 600  600 MG Tabs tablet Generic drug: calcium carbonate Stopped by: Man X Mast, NP     TAKE these medications   acetaminophen 325 MG tablet Commonly known as: TYLENOL Take 325 mg by mouth every 6 (six) hours as needed.   carvedilol 6.25 MG tablet Commonly known as: COREG Take 1 tablet (6.25 mg total) by mouth 2 (two) times daily with a meal.   feeding supplement Liqd Take 1 Container by mouth at bedtime. Prefers Vanilla flavored   hydrocortisone 2.5 % rectal cream Commonly known as: ANUSOL-HC Place 1 application rectally 2 (two) times daily as needed for hemorrhoids.   levothyroxine 75 MCG tablet Commonly known as: SYNTHROID Take 75 mcg by mouth daily.   memantine 5 MG tablet Commonly known as: NAMENDA Take 5 mg by mouth  2 (two) times daily.   polyethylene glycol 17 g packet Commonly known as: MIRALAX / GLYCOLAX Take 17 g by mouth daily.   sennosides-docusate sodium 8.6-50 MG tablet Commonly known as: SENOKOT-S Take 2 tablets by mouth at bedtime.   SEROquel 25 MG tablet Generic drug: QUEtiapine Take 25 mg by mouth 2 (two) times daily.       Review of Systems  Immunization History  Administered Date(s) Administered   Influenza Whole 06/30/2018   Influenza, High Dose Seasonal PF 06/30/2019   Influenza-Unspecified 07/19/2017   Moderna SARS-COVID-2 Vaccination 09/30/2019, 10/28/2019   PPD Test 09/05/2016   Pneumococcal Conjugate-13 08/06/2014   Pneumococcal Polysaccharide-23 06/17/2004   Tetanus 07/13/2012   Pertinent  Health Maintenance Due  Topic Date Due   INFLUENZA VACCINE  04/28/2020   DEXA SCAN  07/23/2021 (Originally 04[birth year]28/1992)   PNA vac Low Risk Adult  Completed   Fall Risk  06/15/2018 10/14/2017 01/28/2016  Falls in the past year? Yes Yes No  Number falls in past yr: 1 1 -  Injury with Fall? Yes Yes -  Comment pelvic fracture - -  Risk for fall due to : Impaired balance/gait - -   Functional Status Survey:    Vitals:   07/01/20 1028  BP: 120/60  Pulse: 82  Resp: 20  Temp: 98.1 F (36.7 C)  SpO2: 98%  Weight: 116 lb (52.6 kg)  Height: 5\' 1"  (1.549 m)   Body mass index is 21.92 kg/m. Physical Exam  Labs reviewed: Recent Labs    04/02/20 0000 06/19/20 0000 06/29/20 0000  NA 139 138 135*  K 4.3 5.0 5.5*  CL 105 102 104  CO2 27* 24* 23*  BUN 42* 67* 85*  CREATININE 1.4* 2.0* 2.4*  CALCIUM 8.9 9.8 9.6   Recent Labs    08/18/19 0000 06/19/20 0000  AST 15 18  ALT 11 17  ALKPHOS 79 94  ALBUMIN  --  4.2   Recent Labs    02/11/20 0000 03/25/20 0000 03/26/20 0000 06/19/20 0000 06/29/20 0000  WBC 8.3   < > 9.4 7.4 6.3  NEUTROABS 6,350  --  7,003 4,662  --   HGB 11.2*   < > 11.7* 10.9* 10.3*  HCT 33*   < > 35* 32* 31*  PLT 246  --  282 315   --    < > = values in this interval not displayed.   Lab Results  Component Value Date   TSH 3.69 06/19/2020   Lab Results  Component Value Date   HGBA1C 5.7 09/30/2017   Lab Results  Component Value Date   CHOL 150 10/17/2019   HDL 76 (A) 10/17/2019   LDLCALC 79  10/17/2019   TRIG 55 10/17/2019    Significant Diagnostic Results in last 30 days:  No results found.  Assessment/Plan There are no diagnoses linked to this encounter.   Family/ staff Communication:   Labs/tests ordered:

## 2020-07-01 NOTE — Assessment & Plan Note (Signed)
Anxiety/depression, Sertraline started 06/24/20, Quetiapine was increased to 25mg  bid 06/24/20 for crying, fearful, too soon to eval.

## 2020-07-01 NOTE — Assessment & Plan Note (Signed)
Controlled, off Lisinopril, continue Coreg.

## 2020-07-01 NOTE — Assessment & Plan Note (Signed)
Stable, continue Senokot S, MiraLax 

## 2020-07-01 NOTE — Assessment & Plan Note (Signed)
Stable, Hgb 10.3 06/28/20, ? Hemoconcentration, observe.

## 2020-07-01 NOTE — Assessment & Plan Note (Signed)
Heart rate is in control, continue Coreg.  

## 2020-07-01 NOTE — Assessment & Plan Note (Addendum)
acute renal injury, Bun/creat 86/2.37, declined ED eval, desired dc Atorvastatin, Ca. Encouraged oral fluid intake, lost #7Ibs in one week,  pending BMP today.

## 2020-07-01 NOTE — Assessment & Plan Note (Signed)
Advanced, continue memory care unit for safety, care assistance, continue Memantine.

## 2020-07-01 NOTE — Progress Notes (Signed)
Location:   SNF [facility] Room Number: 105 Place of Service:  SNF (31) Provider: [facility] Lainy Wrobleski NP  Virgie Dad, MD  Patient Care Team: Virgie Dad, MD as PCP - General (Internal Medicine) Zakariye Nee X, NP as Nurse Practitioner (Internal Medicine) [name], MD as Attending Physician (Neurology)  Extended Emergency Contact Information Primary Emergency Contact: Key,Caroline Address: [place] of [place] Phone: [phone] Mobile Phone: [phone] Relation: Daughter Secondary Emergency Contact: Key,Tim  [place] Mobile Phone: [phone] Relation: Son  Code Status: DNR Goals of care: Advanced Directive information Advanced Directives 06/13/2020  Does Patient Have a Medical Advance Directive? Yes  Type of Advance Directive Out of facility DNR (pink MOST or yellow form);Living will;Healthcare Power of Attorney  Does patient want to make changes to medical advance directive? No - Patient declined  Copy of [place] in Chart? Yes - validated most recent copy scanned in chart (See row information)  Would patient like information on creating a medical advance directive? -  Pre-existing out of facility DNR order (yellow form or pink MOST form) Yellow form placed in chart (order not valid for inpatient use)     Chief Complaint  Patient presents with  . Acute Visit    Acute renal injury    HPI:  Pt is a 86 y.o. female seen today for an acute visit for acute renal injury, Bun/creat 86/2.37, declined ED eval, desired dc Atorvastatin, Ca. Encouraged oral fluid intake, pending BMP today.  HTN, take Coreg.   Dementia, takes Memantine  Anxiety/depression, Sertraline started 06/24/20, Quetiapine was increased to 25mg  bid 06/24/20 for crying  Anemia, Hgb 10.3 06/28/20  Hypothyroidism, takes Levothyroxine.   Constipation, takes Senokot S, MiraLax   Afib, takes Coreg   Past Medical  History:  Diagnosis Date  . Adenomatous colon polyp 07/17/97  . Allergic rhinitis   . Allergy   . Anxiety disorder   . Atrial fibrillation ([place])   . Chronic anticoagulation   . Diverticulosis   . History of shingles   . Hypercholesteremia   . Hypertension   . Hypothyroidism   . Internal hemorrhoids   . Melanoma ([place])   . Memory loss   . Meniere disease   . Tension headache    Past Surgical History:  Procedure Laterality Date  . CATARACT EXTRACTION Right 2012    Allergies  Allergen Reactions  . Amitriptyline     Urinary Retention  . Amlodipine Swelling  . Erythromycin Hives  . Nitrofuran Derivatives   . Nitrofurantoin Nausea Only  . Pseudoephedrine Hcl   . Sudafed [Pseudoephedrine Hcl]     Unknown reaction per MAR   . Sulfa Antibiotics     "really ill"  . Tavist [Clemastine]     Urinary hesitancy  . Trimethoprim     Cramps  . Verapamil     Constipation    Allergies as of 07/01/2020      Reactions   Amitriptyline    Urinary Retention   Amlodipine Swelling   Erythromycin Hives   Nitrofuran Derivatives    Nitrofurantoin Nausea Only   Pseudoephedrine Hcl    Sudafed [pseudoephedrine Hcl]    Unknown reaction per MAR    Sulfa Antibiotics    "really ill"   Tavist [clemastine]    Urinary hesitancy   Trimethoprim    Cramps   Verapamil    Constipation  Medication List       Accurate as of July 01, 2020 11:59 PM. If you have any questions, ask your nurse or doctor.        STOP taking these medications   atorvastatin 10 MG tablet Commonly known as: LIPITOR Stopped by: Tarnesha Ulloa X Breanne Olvera, NP   Calcium 600 600 MG Tabs tablet Generic drug: calcium carbonate Stopped by: Lycia Sachdeva X Machel Violante, NP     TAKE these medications   acetaminophen 325 MG tablet Commonly known as: TYLENOL Take 325 mg by mouth every 6 (six) hours as needed.   carvedilol 6.25 MG tablet Commonly known as: COREG Take 1 tablet (6.25 mg total) by mouth 2 (two) times daily with a meal.     feeding supplement Liqd Take 1 Container by mouth at bedtime. Prefers Vanilla flavored   hydrocortisone 2.5 % rectal cream Commonly known as: ANUSOL-HC Place 1 application rectally 2 (two) times daily as needed for hemorrhoids.   levothyroxine 75 MCG tablet Commonly known as: SYNTHROID Take 75 mcg by mouth daily.   memantine 5 MG tablet Commonly known as: NAMENDA Take 5 mg by mouth 2 (two) times daily.   polyethylene glycol 17 g packet Commonly known as: MIRALAX / GLYCOLAX Take 17 g by mouth daily.   sennosides-docusate sodium 8.6-50 MG tablet Commonly known as: SENOKOT-S Take 2 tablets by mouth at bedtime.   SEROquel 25 MG tablet Generic drug: QUEtiapine Take 25 mg by mouth 2 (two) times daily.       Review of Systems  Constitutional: Positive for appetite change, fatigue and unexpected weight change. Negative for activity change, chills, diaphoresis and fever.       Lost #7Ibs in one week  HENT: Positive for hearing loss. Negative for congestion and voice change.   Eyes: Negative for visual disturbance.  Respiratory: Negative for cough.   Cardiovascular: Negative for leg swelling.  Gastrointestinal: Negative for abdominal pain and constipation.       Not sure if she is constipated, c/o abd discomfort, something inside me  Genitourinary: Negative for dysuria and urgency.  Musculoskeletal: Positive for gait problem.  Skin: Negative for color change.  Neurological: Negative for speech difficulty, light-headedness and headaches.       Dementia  Psychiatric/Behavioral: Positive for agitation and confusion. Negative for behavioral problems and sleep disturbance. The patient is nervous/anxious.     Immunization History  Administered Date(s) Administered  . Influenza Whole 06/30/2018  . Influenza, High Dose Seasonal PF 06/30/2019  . Influenza-Unspecified 07/19/2017  . Moderna SARS-COVID-2 Vaccination 09/30/2019, 10/28/2019  . PPD Test 09/05/2016  . Pneumococcal  Conjugate-13 08/06/2014  . Pneumococcal Polysaccharide-23 06/17/2004  . Tetanus 07/13/2012   Pertinent  Health Maintenance Due  Topic Date Due  . INFLUENZA VACCINE  04/28/2020  . DEXA SCAN  07/23/2021 (Originally 04/28/1992)  . PNA vac Low Risk Adult  Completed   Fall Risk  06/15/2018 10/14/2017 01/28/2016  Falls in the past year? Yes Yes No  Number falls in past yr: 1 1 -  Injury with Fall? Yes Yes -  Comment pelvic fracture - -  Risk for fall due to : Impaired balance/gait - -   Functional Status Survey:    Vitals:   07/01/20 1028  BP: 120/60  Pulse: 82  Resp: 20  Temp: 98.1 F (36.7 C)  SpO2: 98%  Weight: 116 lb (52.6 kg)  Height: 5\' 1"  (1.549 m)   Body mass index is 21.92 kg/m. Physical Exam Vitals and nursing note reviewed.  Constitutional:      General: She is not in acute distress.    Appearance: Normal appearance. She is not ill-appearing, toxic-appearing or diaphoretic.     Comments: Crying, fearful  HENT:     Head: Normocephalic and atraumatic.     Mouth/Throat:     Mouth: Mucous membranes are dry.  Eyes:     Extraocular Movements: Extraocular movements intact.     Conjunctiva/sclera: Conjunctivae normal.     Pupils: Pupils are equal, round, and reactive to light.  Cardiovascular:     Rate and Rhythm: Normal rate and regular rhythm.     Heart sounds: No murmur heard.   Pulmonary:     Breath sounds: No rales.  Abdominal:     General: Bowel sounds are normal.     Palpations: Abdomen is soft.     Tenderness: There is no abdominal tenderness.  Musculoskeletal:     Cervical back: Normal range of motion and neck supple.     Right lower leg: No edema.     Left lower leg: No edema.  Skin:    General: Skin is warm and dry.  Neurological:     Mental Status: She is alert. Mental status is at baseline.     Gait: Gait abnormal.     Comments: Oriented to self.   Psychiatric:        Mood and Affect: Mood normal.     Comments: confused     Labs  reviewed: Recent Labs    04/02/20 0000 06/19/20 0000 06/29/20 0000  NA 139 138 135*  K 4.3 5.0 5.5*  CL 105 102 104  CO2 27* 24* 23*  BUN 42* 67* 85*  CREATININE 1.4* 2.0* 2.4*  CALCIUM 8.9 9.8 9.6   Recent Labs    08/18/19 0000 06/19/20 0000  AST 15 18  ALT 11 17  ALKPHOS 79 94  ALBUMIN  --  4.2   Recent Labs    02/11/20 0000 03/25/20 0000 03/26/20 0000 06/19/20 0000 06/29/20 0000  WBC 8.3   < > 9.4 7.4 6.3  NEUTROABS 6,350  --  7,003 4,662  --   HGB 11.2*   < > 11.7* 10.9* 10.3*  HCT 33*   < > 35* 32* 31*  PLT 246  --  282 315  --    < > = values in this interval not displayed.   Lab Results  Component Value Date   TSH 3.69 06/19/2020   Lab Results  Component Value Date   HGBA1C 5.7 09/30/2017   Lab Results  Component Value Date   CHOL 150 10/17/2019   HDL 76 (A) 10/17/2019   LDLCALC 79 10/17/2019   TRIG 55 10/17/2019    Significant Diagnostic Results in last 30 days:  No results found.  Assessment/Plan: Acute renal injury ([place])  acute renal injury, Bun/creat 86/2.37, declined ED eval, desired dc Atorvastatin, Ca. Encouraged oral fluid intake, lost #7Ibs in one week,  pending BMP today.   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   Slow transit constipation Stable, continue Senokot S, MiraLax.   Hypothyroidism Stable, continue Levothyroxine.   Alzheimer's dementia with behavioral disturbance Advanced, continue memory care unit for safety, care assistance, continue Memantine.   Depression with anxiety Anxiety/depression, Sertraline started 06/24/20, Quetiapine was increased to 25mg  bid 06/24/20 for crying, fearful, too soon to eval.   Anemia  Stable, Hgb 10.3 06/28/20, ? Hemoconcentration, observe.     Family/ staff Communication: plan of care reviewed with the patient and charge  nurse.   Labs/tests ordered: pending BMP 07/01/20  Time spend 35 minutes.

## 2020-07-01 NOTE — Assessment & Plan Note (Signed)
Stable, continue Levothyroxine.  

## 2020-07-02 ENCOUNTER — Encounter: Payer: Self-pay | Admitting: Nurse Practitioner

## 2020-07-04 DIAGNOSIS — N183 Chronic kidney disease, stage 3 unspecified: Secondary | ICD-10-CM | POA: Diagnosis not present

## 2020-07-04 LAB — CBC AND DIFFERENTIAL
HCT: 29 — AB (ref 36–46)
Hemoglobin: 10.1 — AB (ref 12.0–16.0)
Neutrophils Absolute: 4007
Platelets: 284 (ref 150–399)
WBC: 6.7

## 2020-07-04 LAB — BASIC METABOLIC PANEL
BUN: 49 — AB (ref 4–21)
CO2: 21 (ref 13–22)
Chloride: 105 (ref 99–108)
Creatinine: 1.6 — AB (ref 0.5–1.1)
Glucose: 98
Potassium: 4.8 (ref 3.4–5.3)
Sodium: 137 (ref 137–147)

## 2020-07-04 LAB — COMPREHENSIVE METABOLIC PANEL
Calcium: 9 (ref 8.7–10.7)
GFR calc Af Amer: 32
GFR calc non Af Amer: 28

## 2020-07-04 LAB — CBC: RBC: 3.19 — AB (ref 3.87–5.11)

## 2020-07-24 ENCOUNTER — Encounter: Payer: Self-pay | Admitting: Nurse Practitioner

## 2020-07-24 ENCOUNTER — Non-Acute Institutional Stay (SKILLED_NURSING_FACILITY): Payer: Medicare Other | Admitting: Nurse Practitioner

## 2020-07-24 DIAGNOSIS — F418 Other specified anxiety disorders: Secondary | ICD-10-CM

## 2020-07-24 DIAGNOSIS — D649 Anemia, unspecified: Secondary | ICD-10-CM | POA: Diagnosis not present

## 2020-07-24 DIAGNOSIS — S0101XA Laceration without foreign body of scalp, initial encounter: Secondary | ICD-10-CM

## 2020-07-24 DIAGNOSIS — I480 Paroxysmal atrial fibrillation: Secondary | ICD-10-CM

## 2020-07-24 DIAGNOSIS — E039 Hypothyroidism, unspecified: Secondary | ICD-10-CM

## 2020-07-24 DIAGNOSIS — F0281 Dementia in other diseases classified elsewhere with behavioral disturbance: Secondary | ICD-10-CM

## 2020-07-24 DIAGNOSIS — I10 Essential (primary) hypertension: Secondary | ICD-10-CM | POA: Insufficient documentation

## 2020-07-24 DIAGNOSIS — R634 Abnormal weight loss: Secondary | ICD-10-CM | POA: Diagnosis not present

## 2020-07-24 DIAGNOSIS — R269 Unspecified abnormalities of gait and mobility: Secondary | ICD-10-CM | POA: Insufficient documentation

## 2020-07-24 DIAGNOSIS — K5901 Slow transit constipation: Secondary | ICD-10-CM | POA: Diagnosis not present

## 2020-07-24 DIAGNOSIS — W19XXXA Unspecified fall, initial encounter: Secondary | ICD-10-CM

## 2020-07-24 DIAGNOSIS — G309 Alzheimer's disease, unspecified: Secondary | ICD-10-CM

## 2020-07-24 NOTE — Assessment & Plan Note (Signed)
Hypothyroidism, takes Levothyroxine, TSH 3.69 06/19/20

## 2020-07-24 NOTE — Assessment & Plan Note (Signed)
Weight loss about #10 Ibs in the past month, update CBC/diff, CMP/eGFR

## 2020-07-24 NOTE — Progress Notes (Addendum)
Location:   [facility] Room Number: 105 Place of Service:  SNF (31) Provider: Marlana Latus, NP  Virgie Dad, MD  Patient Care Team: Virgie Dad, MD as PCP - General (Internal Medicine) Ebonie Westerlund X, NP as Nurse Practitioner (Internal Medicine) [name], MD as Attending Physician (Neurology)  Extended Emergency Contact Information Primary Emergency Contact: Key,Caroline Address: [place] of [place] Phone: [phone] Mobile Phone: [phone] Relation: Daughter Secondary Emergency Contact: Key,Tim  [place] Mobile Phone: [phone] Relation: Son  Code Status:  DNR Goals of care: Advanced Directive information Advanced Directives 07/24/2020  Does Patient Have a Medical Advance Directive? Yes  Type of Paramedic of [place];Out of facility DNR (pink MOST or yellow form)  Does patient want to make changes to medical advance directive? No - Patient declined  Copy of [place] in Chart? Yes - validated most recent copy scanned in chart (See row information)  Would patient like information on creating a medical advance directive? -  Pre-existing out of facility DNR order (yellow form or pink MOST form) Yellow form placed in chart (order not valid for inpatient use)     Chief Complaint  Patient presents with  . Medical Management of Chronic Issues    Routine follow up visit.    HPI:  Pt is a 86 y.o. female seen today for medical management of chronic diseases.    Weight loss about #10 Ibs in the past month  Constipation, loose stools resolved after MiraLax held then dc'd.   HTN, take Coreg.              Dementia, takes Memantine             Anxiety/depression, Sertraline started 06/24/20, Quetiapine was increased to 44m bid 06/24/20 for crying             Anemia, Hgb 10.3 06/28/20             Hypothyroidism, takes Levothyroxine,  TSH 3.69 06/19/20             Constipation, takes Senokot S             Afib, takes Coreg  Fell around 1:30 pm in her bathroom, unwitnessed, resulted an inch long scalp laceration left parietal area, steri strips applied, well approximated, no focal neurological deficit   Past Medical History:  Diagnosis Date  . Adenomatous colon polyp 07/17/97  . Allergic rhinitis   . Allergy   . Anxiety disorder   . Atrial fibrillation ([place]   . Chronic anticoagulation   . Diverticulosis   . History of shingles   . Hypercholesteremia   . Hypertension   . Hypothyroidism   . Internal hemorrhoids   . [place]   . Memory loss   . Meniere disease   . Tension headache    Past Surgical History:  Procedure Laterality Date  . CATARACT EXTRACTION Right 2012    Allergies  Allergen Reactions  . Amitriptyline     Urinary Retention  . Amlodipine Swelling  . Erythromycin Hives  . Nitrofuran Derivatives   . Nitrofurantoin Nausea Only  . Pseudoephedrine Hcl   . Sudafed [Pseudoephedrine Hcl]     Unknown reaction per MAR   . Sulfa Antibiotics     "really ill"  . Tavist [Clemastine]     Urinary hesitancy  . Trimethoprim  Cramps  . Verapamil     Constipation    Allergies as of 07/24/2020      Reactions   Amitriptyline    Urinary Retention   Amlodipine Swelling   Erythromycin Hives   Nitrofuran Derivatives    Nitrofurantoin Nausea Only   Pseudoephedrine Hcl    Sudafed [pseudoephedrine Hcl]    Unknown reaction per MAR    Sulfa Antibiotics    "really ill"   Tavist [clemastine]    Urinary hesitancy   Trimethoprim    Cramps   Verapamil    Constipation      Medication List       Accurate as of July 24, 2020 11:59 PM. If you have any questions, ask your nurse or doctor.        STOP taking these medications   feeding supplement Liqd Stopped by: Elisheba Mcdonnell X Kimba Lottes, NP   polyethylene glycol 17 g packet Commonly known as: MIRALAX / GLYCOLAX Stopped by: Kaela Beitz X Breeann Reposa, NP       TAKE these medications   acetaminophen 325 MG tablet Commonly known as: TYLENOL Take 325 mg by mouth every 6 (six) hours as needed.   carvedilol 6.25 MG tablet Commonly known as: COREG Take 1 tablet (6.25 mg total) by mouth 2 (two) times daily with a meal.   hydrocortisone 2.5 % rectal cream Commonly known as: ANUSOL-HC Place 1 application rectally 2 (two) times daily as needed for hemorrhoids.   levothyroxine 75 MCG tablet Commonly known as: SYNTHROID Take 75 mcg by mouth daily.   memantine 5 MG tablet Commonly known as: NAMENDA Take 5 mg by mouth 2 (two) times daily. What changed: Another medication with the same name was removed. Continue taking this medication, and follow the directions you see here. Changed by: Carnelia Oscar X Yordin Rhoda, NP   sennosides-docusate sodium 8.6-50 MG tablet Commonly known as: SENOKOT-S Take 2 tablets by mouth at bedtime.   SEROquel 25 MG tablet Generic drug: QUEtiapine Take 25 mg by mouth 2 (two) times daily.       Review of Systems  Constitutional: Positive for appetite change, fatigue and unexpected weight change. Negative for fever.       Lost #10Ibs in the past month  HENT: Positive for hearing loss. Negative for congestion and voice change.   Eyes: Negative for visual disturbance.  Respiratory: Negative for cough.   Cardiovascular: Negative for leg swelling.  Gastrointestinal: Negative for abdominal pain and constipation.       Not sure if she is constipated, c/o abd discomfort, something inside me  Genitourinary: Negative for dysuria and urgency.  Musculoskeletal: Positive for gait problem.  Skin: Negative for color change.  Neurological: Negative for dizziness, speech difficulty and headaches.       Dementia  Psychiatric/Behavioral: Positive for agitation and confusion. Negative for behavioral problems and sleep disturbance. The patient is nervous/anxious.     Immunization History  Administered Date(s) Administered  . Influenza Whole  06/30/2018  . Influenza, High Dose Seasonal PF 06/30/2019  . Influenza-Unspecified 07/19/2017, 07/11/2020  . Moderna SARS-COVID-2 Vaccination 09/30/2019, 10/28/2019  . PPD Test 09/05/2016  . Pneumococcal Conjugate-13 08/06/2014  . Pneumococcal Polysaccharide-23 06/17/2004  . Tetanus 07/13/2012   Pertinent  Health Maintenance Due  Topic Date Due  . DEXA SCAN  07/23/2021 (Originally 04/28/1992)  . INFLUENZA VACCINE  Completed  . PNA vac Low Risk Adult  Completed   Fall Risk  06/15/2018 10/14/2017 01/28/2016  Falls in the past year? Yes Yes No  Number falls  in past yr: 1 1 -  Injury with Fall? Yes Yes -  Comment pelvic fracture - -  Risk for fall due to : Impaired balance/gait - -   Functional Status Survey:    Vitals:   07/24/20 0857  BP: 126/68  Pulse: 92  Resp: 20  Temp: (!) 97.3 F (36.3 C)  SpO2: 97%  Weight: 113 lb (51.3 kg)  Height: '5\' 1"'  (1.549 m)   Body mass index is 21.35 kg/m. Physical Exam Vitals and nursing note reviewed.  Constitutional:      Comments: Appears tired.   HENT:     Head: Normocephalic and atraumatic.     Mouth/Throat:     Mouth: Mucous membranes are dry.  Eyes:     Extraocular Movements: Extraocular movements intact.     Conjunctiva/sclera: Conjunctivae normal.     Pupils: Pupils are equal, round, and reactive to light.  Cardiovascular:     Rate and Rhythm: Normal rate and regular rhythm.     Heart sounds: No murmur heard.   Pulmonary:     Breath sounds: No rales.  Abdominal:     General: Bowel sounds are normal.     Palpations: Abdomen is soft.     Tenderness: There is no abdominal tenderness.  Musculoskeletal:     Cervical back: Normal range of motion and neck supple.     Right lower leg: No edema.     Left lower leg: No edema.  Skin:    General: Skin is warm and dry.     Comments: An inch long scalp laceration left parietal area, well approximated with steri strips.   Neurological:     Mental Status: She is alert. Mental status  is at baseline.     Gait: Gait abnormal.     Comments: Oriented to self.   Psychiatric:        Mood and Affect: Mood normal.     Comments: confused     Labs reviewed: Recent Labs    06/29/20 0000 07/01/20 0000 07/04/20 0000  NA 135* 137 137  K 5.5* 4.9 4.8  CL 104 105 105  CO2 23* 21 21  BUN 85* 61* 49*  CREATININE 2.4* 1.7* 1.6*  CALCIUM 9.6 9.3 9.0   Recent Labs    08/18/19 0000 06/19/20 0000  AST 15 18  ALT 11 17  ALKPHOS 79 94  ALBUMIN  --  4.2   Recent Labs    03/26/20 0000 03/26/20 0000 06/19/20 0000 06/29/20 0000 07/04/20 0000  WBC 9.4   < > 7.4 6.3 6.7  NEUTROABS 7,003  --  4,662  --  4,007.00  HGB 11.7*   < > 10.9* 10.3* 10.1*  HCT 35*   < > 32* 31* 29*  PLT 282  --  315  --  284   < > = values in this interval not displayed.   Lab Results  Component Value Date   TSH 3.69 06/19/2020   Lab Results  Component Value Date   HGBA1C 5.7 09/30/2017   Lab Results  Component Value Date   CHOL 150 10/17/2019   HDL 76 (A) 10/17/2019   LDLCALC 79 10/17/2019   TRIG 55 10/17/2019    Significant Diagnostic Results in last 30 days:  No results found.  Assessment/Plan Weight decreased Weight loss about #10 Ibs in the past month, update CBC/diff, CMP/eGFR   Slow transit constipation Constipation, loose stools resolved after MiraLax held then dc'd. Dc Senokot S-loose stools.  07/25/20  wbc 6.8, Hgb 10.8, plt 351, neutrophils 64.7%, Na 139, K 4.4, Bun 32, creat 1.42, eGFR 32.     HTN (hypertension) Blood pressure is in control, continue Coreg.   Atrial fibrillation (HCC) Heart rate is in control, continue Coreg.   Alzheimer's dementia with behavioral disturbance ([place]) Continue Memantine, continue supportive care in memory care unit [facility]  Depression with anxiety Her mood is stabilizing, Anxiety/depression, Sertraline started 06/24/20, Quetiapine was increased to 45m bid 06/24/20 for crying   Anemia Anemia, Hgb 10.3  06/28/20   Hypothyroidism Hypothyroidism, takes Levothyroxine, TSH 3.69 06/19/20  Scalp laceration, initial encounter Fell around 1:30 pm in her bathroom, unwitnessed, resulted an inch long scalp laceration left parietal area, steri strips applied, well approximated, no focal neurological deficit. Neuro check.   Fall Fell around 1:30 pm in her bathroom, unwitnessed, resulted an inch long scalp laceration left parietal area, steri strips applied, well approximated, no focal neurological deficit. Close supervision/assistance needed for safety. Therapy to eval and tx.   Gait abnormality Continue to ambulate with walker with SBA. PT to eval, tx, recommendations.      Family/ staff Communication: plan of care reviewed with the patient and charge nurse.   Labs/tests ordered:  CBC/diff, CMP/eGFR  Time spend 35 minutes.

## 2020-07-24 NOTE — Assessment & Plan Note (Signed)
Fell around 1:30 pm in her bathroom, unwitnessed, resulted an inch long scalp laceration left parietal area, steri strips applied, well approximated, no focal neurological deficit. Neuro check.

## 2020-07-24 NOTE — Assessment & Plan Note (Addendum)
Constipation, loose stools resolved after MiraLax held then dc'd. Dc Senokot S-loose stools.  07/25/20 wbc 6.8, Hgb 10.8, plt 351, neutrophils 64.7%, Na 139, K 4.4, Bun 32, creat 1.42, eGFR 32.

## 2020-07-24 NOTE — Assessment & Plan Note (Signed)
Heart rate is in control, continue Coreg.

## 2020-07-24 NOTE — Assessment & Plan Note (Signed)
Continue to ambulate with walker with SBA. PT to eval, tx, recommendations.

## 2020-07-24 NOTE — Assessment & Plan Note (Addendum)
Fell around 1:30 pm in her bathroom, unwitnessed, resulted an inch long scalp laceration left parietal area, steri strips applied, well approximated, no focal neurological deficit. Close supervision/assistance needed for safety. Therapy to eval and tx.

## 2020-07-24 NOTE — Assessment & Plan Note (Signed)
Blood pressure is in control, continue Coreg.

## 2020-07-24 NOTE — Assessment & Plan Note (Signed)
Anemia, Hgb 10.3 06/28/20

## 2020-07-24 NOTE — Assessment & Plan Note (Signed)
Her mood is stabilizing, Anxiety/depression, Sertraline started 06/24/20, Quetiapine was increased to 25mg  bid 06/24/20 for crying

## 2020-07-24 NOTE — Assessment & Plan Note (Signed)
Continue Memantine, continue supportive care in memory care unit [facility]

## 2020-07-25 ENCOUNTER — Encounter: Payer: Self-pay | Admitting: Nurse Practitioner

## 2020-07-25 DIAGNOSIS — I10 Essential (primary) hypertension: Secondary | ICD-10-CM | POA: Diagnosis not present

## 2020-07-25 LAB — BASIC METABOLIC PANEL
BUN: 32 — AB (ref 4–21)
CO2: 23 — AB (ref 13–22)
Chloride: 105 (ref 99–108)
Creatinine: 1.4 — AB (ref 0.5–1.1)
Glucose: 107
Potassium: 4.4 (ref 3.4–5.3)
Sodium: 139 (ref 137–147)

## 2020-07-25 LAB — CBC AND DIFFERENTIAL
HCT: 32 — AB (ref 36–46)
Hemoglobin: 10.8 — AB (ref 12.0–16.0)
Neutrophils Absolute: 4400
Platelets: 351 (ref 150–399)
WBC: 6.8

## 2020-07-25 LAB — CBC: RBC: 3.39 — AB (ref 3.87–5.11)

## 2020-07-25 LAB — COMPREHENSIVE METABOLIC PANEL
Albumin: 4 (ref 3.5–5.0)
Calcium: 9.6 (ref 8.7–10.7)
Globulin: 2.4

## 2020-07-25 LAB — HEPATIC FUNCTION PANEL
ALT: 13 (ref 7–35)
AST: 17 (ref 13–35)
Alkaline Phosphatase: 118 (ref 25–125)
Bilirubin, Total: 0.6

## 2020-07-28 DIAGNOSIS — M545 Low back pain, unspecified: Secondary | ICD-10-CM | POA: Diagnosis not present

## 2020-07-28 DIAGNOSIS — M79652 Pain in left thigh: Secondary | ICD-10-CM | POA: Diagnosis not present

## 2020-07-28 DIAGNOSIS — M25552 Pain in left hip: Secondary | ICD-10-CM | POA: Diagnosis not present

## 2020-07-29 ENCOUNTER — Encounter: Payer: Self-pay | Admitting: Nurse Practitioner

## 2020-07-29 ENCOUNTER — Non-Acute Institutional Stay (SKILLED_NURSING_FACILITY): Payer: Medicare Other | Admitting: Nurse Practitioner

## 2020-07-29 DIAGNOSIS — F418 Other specified anxiety disorders: Secondary | ICD-10-CM

## 2020-07-29 DIAGNOSIS — E039 Hypothyroidism, unspecified: Secondary | ICD-10-CM

## 2020-07-29 DIAGNOSIS — W19XXXA Unspecified fall, initial encounter: Secondary | ICD-10-CM

## 2020-07-29 DIAGNOSIS — G309 Alzheimer's disease, unspecified: Secondary | ICD-10-CM

## 2020-07-29 DIAGNOSIS — I10 Essential (primary) hypertension: Secondary | ICD-10-CM | POA: Diagnosis not present

## 2020-07-29 DIAGNOSIS — K5901 Slow transit constipation: Secondary | ICD-10-CM

## 2020-07-29 DIAGNOSIS — D649 Anemia, unspecified: Secondary | ICD-10-CM | POA: Diagnosis not present

## 2020-07-29 DIAGNOSIS — I480 Paroxysmal atrial fibrillation: Secondary | ICD-10-CM

## 2020-07-29 DIAGNOSIS — N179 Acute kidney failure, unspecified: Secondary | ICD-10-CM

## 2020-07-29 DIAGNOSIS — R269 Unspecified abnormalities of gait and mobility: Secondary | ICD-10-CM | POA: Diagnosis not present

## 2020-07-29 DIAGNOSIS — M25552 Pain in left hip: Secondary | ICD-10-CM | POA: Diagnosis not present

## 2020-07-29 DIAGNOSIS — R634 Abnormal weight loss: Secondary | ICD-10-CM | POA: Diagnosis not present

## 2020-07-29 DIAGNOSIS — F0281 Dementia in other diseases classified elsewhere with behavioral disturbance: Secondary | ICD-10-CM

## 2020-07-29 NOTE — Assessment & Plan Note (Signed)
Anemia, Hgb 10.3 07/04/20

## 2020-07-29 NOTE — Assessment & Plan Note (Signed)
Working with therapy.

## 2020-07-29 NOTE — Progress Notes (Signed)
Location:    [facility] Room Number: 105 Place of Service:  SNF (31) Provider: Lennie Odor Yousuf Ager NP  Jamie Dad, MD  Patient Care Team: Jamie Dad, MD as PCP - General (Internal Medicine) Dimitry Holsworth X, NP as Nurse Practitioner (Internal Medicine) [name], MD as Attending Physician (Neurology)  Extended Emergency Contact Information Primary Emergency Contact: JamieThompson Address: [place] Phone: [phone] Mobile Phone: [phone] Relation: Daughter Secondary Emergency Contact: JamieTim  [place] Mobile Phone: [phone] Relation: Son  Code Status: DNR Goals of care: Advanced Directive information Advanced Directives 07/24/2020  Does Patient Have a Medical Advance Directive? Yes  Type of Paramedic of [place];Out of facility DNR (pink MOST or yellow form)  Does patient want to make changes to medical advance directive? No - Patient declined  Copy of [place] in Chart? Yes - validated most recent copy scanned in chart (See row information)  Would patient like information on creating a medical advance directive? -  Pre-existing out of facility DNR order (yellow form or pink MOST form) Yellow form placed in chart (order not valid for inpatient use)     Chief Complaint  Patient presents with  . Acute Visit    Fall, L hip pain    HPI:  Pt is a 86 y.o. female seen today for an acute visit for left hip pain sustained from fall 07/26/20 around 8pm when the patient was found kneeling bedise jer bed in a prayer position with her upper body over the bed and her head resting in her hands. She was speaking to herself loudly and crying 07/26/20.  X-ray L hip/pelvis/femur 07/28/20 showed DDD, no acute bony abnormalities, left superior and inferior pubic ramus near the symphysis fractures are most likely old.              Weight  loss about #10 Ibs in the past month             HTN, take Coreg.  Dementia, takes Memantine Anxiety/depression, Sertraline started 06/24/20, Quetiapine was increased to 12m bid 06/24/20 for crying Anemia, Hgb 10.3 07/04/20 Hypothyroidism, takes Levothyroxine, TSH 3.69 06/19/20 Constipation, off laxatives.  Afib, takes Coreg               Past Medical History:  Diagnosis Date  . Adenomatous colon polyp 07/17/97  . Allergic rhinitis   . Allergy   . Anxiety disorder   . Atrial fibrillation ([place]   . Chronic anticoagulation   . Diverticulosis   . History of shingles   . Hypercholesteremia   . Hypertension   . Hypothyroidism   . Internal hemorrhoids   . Melanoma ([place]   . Memory loss   . Meniere disease   . Tension headache    Past Surgical History:  Procedure Laterality Date  . CATARACT EXTRACTION Right 2012    Allergies  Allergen Reactions  . Amitriptyline     Urinary Retention  . Amlodipine Swelling  . Erythromycin Hives  . Nitrofuran Derivatives   . Nitrofurantoin Nausea Only  . Pseudoephedrine Hcl   . Sudafed [Pseudoephedrine Hcl]     Unknown reaction per MAR   . Sulfa Antibiotics     "really ill"  . Tavist [Clemastine]     Urinary hesitancy  . Trimethoprim     Cramps  . Verapamil     Constipation  Allergies as of 07/29/2020      Reactions   Amitriptyline    Urinary Retention   Amlodipine Swelling   Erythromycin Hives   Nitrofuran Derivatives    Nitrofurantoin Nausea Only   Pseudoephedrine Hcl    Sudafed [pseudoephedrine Hcl]    Unknown reaction per MAR    Sulfa Antibiotics    "really ill"   Tavist [clemastine]    Urinary hesitancy   Trimethoprim    Cramps   Verapamil    Constipation      Medication List       Accurate as of July 29, 2020 11:59 PM. If you have any questions, ask your nurse or doctor.        STOP taking these medications   sennosides-docusate  sodium 8.6-50 MG tablet Commonly known as: SENOKOT-S Stopped by:  X , NP     TAKE these medications   acetaminophen 325 MG tablet Commonly known as: TYLENOL Take 325 mg by mouth every 6 (six) hours as needed.   acetaminophen 325 MG tablet Commonly known as: TYLENOL Take 650 mg by mouth in the morning, at noon, and at bedtime.   carvedilol 6.25 MG tablet Commonly known as: COREG Take 1 tablet (6.25 mg total) by mouth 2 (two) times daily with a meal.   hydrocortisone 2.5 % rectal cream Commonly known as: ANUSOL-HC Place 1 application rectally 2 (two) times daily as needed for hemorrhoids.   levothyroxine 75 MCG tablet Commonly known as: SYNTHROID Take 75 mcg by mouth daily.   memantine 5 MG tablet Commonly known as: NAMENDA Take 5 mg by mouth 2 (two) times daily.   SEROquel 25 MG tablet Generic drug: QUEtiapine Take 25 mg by mouth 2 (two) times daily.       Review of Systems  Constitutional: Positive for appetite change, fatigue and unexpected weight change. Negative for fever.       Lost #10Ibs in the past month, #5Ibs in the past week  HENT: Positive for hearing loss. Negative for congestion and voice change.   Eyes: Negative for visual disturbance.  Respiratory: Negative for cough.   Cardiovascular: Negative for leg swelling.  Gastrointestinal: Negative for abdominal pain and constipation.       Not sure if she is constipated, c/o abd discomfort, something inside me  Genitourinary: Negative for dysuria and urgency.  Musculoskeletal: Positive for gait problem.       Fall 07/24/20, 07/26/20  Skin: Negative for color change.  Neurological: Negative for dizziness, speech difficulty and headaches.       Dementia  Psychiatric/Behavioral: Positive for agitation and confusion. Negative for behavioral problems and sleep disturbance. The patient is nervous/anxious.     Immunization History  Administered Date(s) Administered  . Influenza Whole 06/30/2018  .  Influenza, High Dose Seasonal PF 06/30/2019  . Influenza-Unspecified 07/19/2017, 07/11/2020  . Moderna SARS-COVID-2 Vaccination 09/30/2019, 10/28/2019  . PPD Test 09/05/2016  . Pneumococcal Conjugate-13 08/06/2014  . Pneumococcal Polysaccharide-23 06/17/2004  . Tetanus 07/13/2012   Pertinent  Health Maintenance Due  Topic Date Due  . DEXA SCAN  07/23/2021 (Originally 04/28/1992)  . INFLUENZA VACCINE  Completed  . PNA vac Low Risk Adult  Completed   Fall Risk  06/15/2018 10/14/2017 01/28/2016  Falls in the past year? Yes Yes No  Number falls in past yr: 1 1 -  Injury with Fall? Yes Yes -  Comment pelvic fracture - -  Risk for fall due to : Impaired balance/gait - -   Functional Status  Survey:    Vitals:   07/29/20 1439  BP: 134/66  Pulse: 88  Resp: 20  Temp: 98.3 F (36.8 C)  SpO2: 94%  Weight: 108 lb 9.6 oz (49.3 kg)  Height: 5' 1" (1.549 m)   Body mass index is 20.52 kg/m. Physical Exam Vitals and nursing note reviewed.  Constitutional:      Comments: Appears tired.   HENT:     Head: Normocephalic and atraumatic.     Mouth/Throat:     Mouth: Mucous membranes are dry.  Eyes:     Extraocular Movements: Extraocular movements intact.     Conjunctiva/sclera: Conjunctivae normal.     Pupils: Pupils are equal, round, and reactive to light.  Cardiovascular:     Rate and Rhythm: Normal rate and regular rhythm.     Heart sounds: No murmur heard.   Pulmonary:     Breath sounds: No rales.  Abdominal:     General: Bowel sounds are normal.     Palpations: Abdomen is soft.     Tenderness: There is no abdominal tenderness.  Musculoskeletal:     Cervical back: Normal range of motion and neck supple.     Right lower leg: No edema.     Left lower leg: No edema.  Skin:    General: Skin is warm and dry.     Comments: An inch long scalp laceration left parietal area, well approximated with steri strips.   Neurological:     Mental Status: She is alert. Mental status is at  baseline.     Gait: Gait abnormal.     Comments: Oriented to self.   Psychiatric:        Mood and Affect: Mood normal.     Comments: confused     Labs reviewed: Recent Labs    07/01/20 0000 07/04/20 0000 07/25/20 0000  NA 137 137 139  K 4.9 4.8 4.4  CL 105 105 105  CO2 21 21 23*  BUN 61* 49* 32*  CREATININE 1.7* 1.6* 1.4*  CALCIUM 9.3 9.0 9.6   Recent Labs    08/18/19 0000 06/19/20 0000 07/25/20 0000  AST _0 ALT _1 ALKPHOS 79 94 118  ALBUMIN  --  4.2 4.0   Recent Labs    06/19/20 0000 06/19/20 0000 06/29/20 0000 07/04/20 0000 07/25/20 0000  WBC 7.4   < > 6.3 6.7 6.8  NEUTROABS 4,662  --   --  4,007.00 4,400.00  HGB 10.9*   < > 10.3* 10.1* 10.8*  HCT 32*   < > 31* 29* 32*  PLT 315  --   --  284 351   < > = values in this interval not displayed.   Lab Results  Component Value Date   TSH 3.69 06/19/2020   Lab Results  Component Value Date   HGBA1C 5.7 09/30/2017   Lab Results  Component Value Date   CHOL 150 10/17/2019   HDL 76 (A) 10/17/2019   LDLCALC 79 10/17/2019   TRIG 55 10/17/2019    Significant Diagnostic Results in last 30 days:  No results found.  Assessment/Plan: Left hip pain  left hip pain sustained from fall 07/26/20 around 8pm when the patient was found kneeling bedise jer bed in a prayer position with her upper body over the bed and her head resting in her hands. She was speaking to herself loudly and crying 07/26/20.  X-ray L hip/pelvis/femur 07/28/20 showed DDD, no acute bony abnormalities, left  superior and inferior pubic ramus near the symphysis fractures are most likely old.  Schedule Tylenol 693m po tid x 10 days. Close supervision/assistance for safety.   Weight decreased Weight loss about #10 Ibs in the past month, # 5Ibs in the past week,  will try Mirtazapine 7.578mqd. Encourage oral fluid intake, update BMP  HTN (hypertension)  HTN, take Coreg.   Alzheimer's dementia with behavioral disturbance  ([place]ementia, takes Memantine, lack of safety awareness, risk for falling.    Depression with anxiety Anxiety/depression, Sertraline started 06/24/20, Quetiapine was increased to 25106mid 06/24/20 for crying  Anemia Anemia, Hgb 10.3 07/04/20   Hypothyroidism Hypothyroidism, takes Levothyroxine, TSH 3.69 06/19/20   Slow transit constipation Stable, off Laxatives due to loose stools.   Atrial fibrillation (HCC) Heart rate is in control, continue Coreg.   Gait abnormality Working with therapy  [place]/27/21, 07/26/20, left hip pain, scalp laceration. Close supervision/assitance, 07/26/20 CBC/diff, CMP/eGFR: wbc 6.8, Hgb 10.8, plt 351, neutrophils 64.7%, Na 139, K 4.4, Bun 32, creat 1.42, eGFR. 32  Acute renal injury (HCCOsceolaun/creat 32/1.42 07/25/20, will continue supportive care, encourage oral fluid intake, adding Mirtazapine for weight loss and appetite, f/u BMP     Family/ staff Communication: plan of care reviewed with the patient and charge nurse.   Labs/tests ordered:  BMP   Time spend 35 minutes.

## 2020-07-29 NOTE — Assessment & Plan Note (Signed)
Anxiety/depression, Sertraline started 06/24/20, Quetiapine was increased to 25mg  bid 06/24/20 for crying

## 2020-07-29 NOTE — Assessment & Plan Note (Addendum)
Fell 07/24/20, 07/26/20, left hip pain, scalp laceration. Close supervision/assitance, 07/26/20 CBC/diff, CMP/eGFR: wbc 6.8, Hgb 10.8, plt 351, neutrophils 64.7%, Na 139, K 4.4, Bun 32, creat 1.42, eGFR. [place]

## 2020-07-29 NOTE — Assessment & Plan Note (Addendum)
Weight loss about #10 Ibs in the past month, # 5Ibs in the past week,  will try Mirtazapine 7.5mg  qd. Encourage oral fluid intake, update BMP

## 2020-07-29 NOTE — Assessment & Plan Note (Addendum)
Bun/creat 32/1.42 07/25/20, will continue supportive care, encourage oral fluid intake, adding Mirtazapine for weight loss and appetite, f/u BMP

## 2020-07-29 NOTE — Assessment & Plan Note (Signed)
Stable, off Laxatives due to loose stools.

## 2020-07-29 NOTE — Assessment & Plan Note (Signed)
left hip pain sustained from fall 07/26/20 around 8pm when the patient was found kneeling bedise jer bed in a prayer position with her upper body over the bed and her head resting in her hands. She was speaking to herself loudly and crying 07/26/20.  X-ray L hip/pelvis/femur 07/28/20 showed DDD, no acute bony abnormalities, left superior and inferior pubic ramus near the symphysis fractures are most likely old.  Schedule Tylenol 650mg  po tid x 10 days. Close supervision/assistance for safety.

## 2020-07-29 NOTE — Assessment & Plan Note (Signed)
HTN, take Coreg.

## 2020-07-29 NOTE — Assessment & Plan Note (Signed)
Heart rate is in control, continue Coreg.

## 2020-07-29 NOTE — Assessment & Plan Note (Signed)
Hypothyroidism, takes Levothyroxine, TSH 3.69 06/19/20

## 2020-07-29 NOTE — Assessment & Plan Note (Signed)
Dementia, takes Memantine, lack of safety awareness, risk for falling.

## 2020-07-30 ENCOUNTER — Encounter: Payer: Self-pay | Admitting: Nurse Practitioner

## 2020-07-30 DIAGNOSIS — R2681 Unsteadiness on feet: Secondary | ICD-10-CM | POA: Diagnosis not present

## 2020-07-30 DIAGNOSIS — M6281 Muscle weakness (generalized): Secondary | ICD-10-CM | POA: Diagnosis not present

## 2020-07-30 DIAGNOSIS — F0280 Dementia in other diseases classified elsewhere without behavioral disturbance: Secondary | ICD-10-CM | POA: Diagnosis not present

## 2020-07-30 DIAGNOSIS — E039 Hypothyroidism, unspecified: Secondary | ICD-10-CM | POA: Diagnosis not present

## 2020-07-30 DIAGNOSIS — R41841 Cognitive communication deficit: Secondary | ICD-10-CM | POA: Diagnosis not present

## 2020-07-30 DIAGNOSIS — S2242XD Multiple fractures of ribs, left side, subsequent encounter for fracture with routine healing: Secondary | ICD-10-CM | POA: Diagnosis not present

## 2020-07-30 DIAGNOSIS — N390 Urinary tract infection, site not specified: Secondary | ICD-10-CM | POA: Diagnosis not present

## 2020-07-30 DIAGNOSIS — R29898 Other symptoms and signs involving the musculoskeletal system: Secondary | ICD-10-CM | POA: Diagnosis not present

## 2020-07-30 DIAGNOSIS — R2689 Other abnormalities of gait and mobility: Secondary | ICD-10-CM | POA: Diagnosis not present

## 2020-07-30 DIAGNOSIS — K219 Gastro-esophageal reflux disease without esophagitis: Secondary | ICD-10-CM | POA: Diagnosis not present

## 2020-07-30 DIAGNOSIS — R296 Repeated falls: Secondary | ICD-10-CM | POA: Diagnosis not present

## 2020-07-30 DIAGNOSIS — H8109 Meniere's disease, unspecified ear: Secondary | ICD-10-CM | POA: Diagnosis not present

## 2020-07-30 DIAGNOSIS — I10 Essential (primary) hypertension: Secondary | ICD-10-CM | POA: Diagnosis not present

## 2020-07-30 DIAGNOSIS — R1311 Dysphagia, oral phase: Secondary | ICD-10-CM | POA: Diagnosis not present

## 2020-07-31 DIAGNOSIS — F0280 Dementia in other diseases classified elsewhere without behavioral disturbance: Secondary | ICD-10-CM | POA: Diagnosis not present

## 2020-07-31 DIAGNOSIS — R2681 Unsteadiness on feet: Secondary | ICD-10-CM | POA: Diagnosis not present

## 2020-07-31 DIAGNOSIS — R296 Repeated falls: Secondary | ICD-10-CM | POA: Diagnosis not present

## 2020-07-31 DIAGNOSIS — R29898 Other symptoms and signs involving the musculoskeletal system: Secondary | ICD-10-CM | POA: Diagnosis not present

## 2020-07-31 DIAGNOSIS — M6281 Muscle weakness (generalized): Secondary | ICD-10-CM | POA: Diagnosis not present

## 2020-07-31 DIAGNOSIS — R41841 Cognitive communication deficit: Secondary | ICD-10-CM | POA: Diagnosis not present

## 2020-08-01 DIAGNOSIS — F0280 Dementia in other diseases classified elsewhere without behavioral disturbance: Secondary | ICD-10-CM | POA: Diagnosis not present

## 2020-08-01 DIAGNOSIS — R41841 Cognitive communication deficit: Secondary | ICD-10-CM | POA: Diagnosis not present

## 2020-08-01 DIAGNOSIS — R296 Repeated falls: Secondary | ICD-10-CM | POA: Diagnosis not present

## 2020-08-01 DIAGNOSIS — R29898 Other symptoms and signs involving the musculoskeletal system: Secondary | ICD-10-CM | POA: Diagnosis not present

## 2020-08-01 DIAGNOSIS — M6281 Muscle weakness (generalized): Secondary | ICD-10-CM | POA: Diagnosis not present

## 2020-08-01 DIAGNOSIS — R2681 Unsteadiness on feet: Secondary | ICD-10-CM | POA: Diagnosis not present

## 2020-08-02 DIAGNOSIS — R296 Repeated falls: Secondary | ICD-10-CM | POA: Diagnosis not present

## 2020-08-02 DIAGNOSIS — R29898 Other symptoms and signs involving the musculoskeletal system: Secondary | ICD-10-CM | POA: Diagnosis not present

## 2020-08-02 DIAGNOSIS — R2681 Unsteadiness on feet: Secondary | ICD-10-CM | POA: Diagnosis not present

## 2020-08-02 DIAGNOSIS — M6281 Muscle weakness (generalized): Secondary | ICD-10-CM | POA: Diagnosis not present

## 2020-08-02 DIAGNOSIS — F0280 Dementia in other diseases classified elsewhere without behavioral disturbance: Secondary | ICD-10-CM | POA: Diagnosis not present

## 2020-08-02 DIAGNOSIS — R41841 Cognitive communication deficit: Secondary | ICD-10-CM | POA: Diagnosis not present

## 2020-08-05 DIAGNOSIS — F0280 Dementia in other diseases classified elsewhere without behavioral disturbance: Secondary | ICD-10-CM | POA: Diagnosis not present

## 2020-08-05 DIAGNOSIS — R29898 Other symptoms and signs involving the musculoskeletal system: Secondary | ICD-10-CM | POA: Diagnosis not present

## 2020-08-05 DIAGNOSIS — M6281 Muscle weakness (generalized): Secondary | ICD-10-CM | POA: Diagnosis not present

## 2020-08-05 DIAGNOSIS — R2681 Unsteadiness on feet: Secondary | ICD-10-CM | POA: Diagnosis not present

## 2020-08-05 DIAGNOSIS — R41841 Cognitive communication deficit: Secondary | ICD-10-CM | POA: Diagnosis not present

## 2020-08-05 DIAGNOSIS — R296 Repeated falls: Secondary | ICD-10-CM | POA: Diagnosis not present

## 2020-08-06 ENCOUNTER — Non-Acute Institutional Stay (SKILLED_NURSING_FACILITY): Payer: Medicare Other | Admitting: Internal Medicine

## 2020-08-06 ENCOUNTER — Encounter: Payer: Self-pay | Admitting: Internal Medicine

## 2020-08-06 DIAGNOSIS — E039 Hypothyroidism, unspecified: Secondary | ICD-10-CM | POA: Diagnosis not present

## 2020-08-06 DIAGNOSIS — R634 Abnormal weight loss: Secondary | ICD-10-CM

## 2020-08-06 DIAGNOSIS — R300 Dysuria: Secondary | ICD-10-CM

## 2020-08-06 DIAGNOSIS — G309 Alzheimer's disease, unspecified: Secondary | ICD-10-CM

## 2020-08-06 DIAGNOSIS — N179 Acute kidney failure, unspecified: Secondary | ICD-10-CM | POA: Diagnosis not present

## 2020-08-06 DIAGNOSIS — F418 Other specified anxiety disorders: Secondary | ICD-10-CM | POA: Diagnosis not present

## 2020-08-06 DIAGNOSIS — F0281 Dementia in other diseases classified elsewhere with behavioral disturbance: Secondary | ICD-10-CM

## 2020-08-06 DIAGNOSIS — I480 Paroxysmal atrial fibrillation: Secondary | ICD-10-CM | POA: Diagnosis not present

## 2020-08-06 DIAGNOSIS — R82998 Other abnormal findings in urine: Secondary | ICD-10-CM | POA: Diagnosis not present

## 2020-08-06 DIAGNOSIS — I10 Essential (primary) hypertension: Secondary | ICD-10-CM | POA: Diagnosis not present

## 2020-08-06 LAB — COMPREHENSIVE METABOLIC PANEL
Calcium: 10.2 (ref 8.7–10.7)
GFR calc Af Amer: 12
GFR calc non Af Amer: 11

## 2020-08-06 LAB — BASIC METABOLIC PANEL
BUN: 129 — AB (ref 4–21)
CO2: 23 — AB (ref 13–22)
Chloride: 102 (ref 99–108)
Creatinine: 3.5 — AB (ref 0.5–1.1)
Glucose: 198
Potassium: 5.4 — AB (ref 3.4–5.3)
Sodium: 140 (ref 137–147)

## 2020-08-06 MED ORDER — MORPHINE SULFATE (CONCENTRATE) 20 MG/ML PO SOLN: 5.0000 mg | ORAL | 0 refills | Status: AC | PRN

## 2020-08-06 NOTE — Progress Notes (Signed)
Location: [facility] Room Number: 105-A Place of Service:  SNF (31)  Provider:   Code Status: DNR Goals of Care:  Advanced Directives 08/06/2020  Does Patient Have a Medical Advance Directive? Yes  Type of Paramedic of [place];Living will;Out of facility DNR (pink MOST or yellow form)  Does patient want to make changes to medical advance directive? No - Patient declined  Copy of [place] in Chart? Yes - validated most recent copy scanned in chart (See row information)  Would patient like information on creating a medical advance directive? -  Pre-existing out of facility DNR order (yellow form or pink MOST form) Yellow form placed in chart (order not valid for inpatient use)     Chief Complaint  Patient presents with  . Acute Visit    Patient is seen for dysuria    HPI: Patient is a 86 y.o. female seen today for an acute visit for Dysuria, Weight loss and Lethargy  She has h/o Dementia with Behavior issues, Hypertension, Hypothyrodism, PAF not on Any anticoagulation, Hyperlipemia She is long term Resident of Memory Unit.  Last few months she has lost 25 lbs. Poor appetite. Nurses also noticed foul smelling Urine with c/o Dysuria by patient since yesterday Also had one episode of vomiting yesterday.  No fever shortness of breath or cough. Patient did eat today and had no more episodes Patient unable to give me any history she was in her room sleeping. Patient does have a history of dementia with aphasia   Past Medical History:  Diagnosis Date  . Adenomatous colon polyp 07/17/97  . Allergic rhinitis   . Allergy   . Anxiety disorder   . Atrial fibrillation ([place])   . Chronic anticoagulation   . Diverticulosis   . History of shingles   . Hypercholesteremia   . Hypertension   . Hypothyroidism   . Internal hemorrhoids   . Melanoma ([place])   . Memory loss   . Meniere disease   . Tension headache      Past Surgical History:  Procedure Laterality Date  . CATARACT EXTRACTION Right 2012    Allergies  Allergen Reactions  . Amitriptyline     Urinary Retention  . Amlodipine Swelling  . Erythromycin Hives  . Nitrofuran Derivatives   . Nitrofurantoin Nausea Only  . Pseudoephedrine Hcl   . Sudafed [Pseudoephedrine Hcl]     Unknown reaction per MAR   . Sulfa Antibiotics     "really ill"  . Tavist [Clemastine]     Urinary hesitancy  . Trimethoprim     Cramps  . Verapamil     Constipation    Outpatient Encounter Medications as of 08/06/2020  Medication Sig  . acetaminophen (TYLENOL) 325 MG tablet Take 325 mg by mouth every 6 (six) hours as needed.   Marland Kitchen acetaminophen (TYLENOL) 325 MG tablet Take 650 mg by mouth in the morning, at noon, and at bedtime.  . carvedilol (COREG) 6.25 MG tablet Take 1 tablet (6.25 mg total) by mouth 2 (two) times daily with a meal.  . hydrocortisone (ANUSOL-HC) 2.5 % rectal cream Place 1 application rectally 2 (two) times daily as needed for hemorrhoids.  Marland Kitchen levothyroxine (SYNTHROID, LEVOTHROID) 75 MCG tablet Take 75 mcg by mouth daily.  . memantine (NAMENDA) 5 MG tablet Take 5 mg by mouth 2 (two) times daily.   . mirtazapine (REMERON) 7.5 MG tablet Take 7.5 mg by mouth at bedtime.  Marland Kitchen QUEtiapine (  SEROQUEL) 25 MG tablet Take 25 mg by mouth 2 (two) times daily.    No facility-administered encounter medications on file as of 08/06/2020.    Review of Systems:  Review of Systems  Unable to perform ROS: Mental status change    Health Maintenance  Topic Date Due  . DEXA SCAN  07/23/2021 (Originally 04/28/1992)  . TETANUS/TDAP  07/13/2022  . INFLUENZA VACCINE  Completed  . COVID-19 Vaccine  Completed  . PNA vac Low Risk Adult  Completed    Physical Exam: Vitals:   08/06/20 1454  BP: 128/60  Pulse: 76  Resp: 20  Temp: 97.8 F (36.6 C)  TempSrc: Oral  SpO2: 95%  Weight: 108 lb 3.2 oz (49.1 kg)  Height: 5\' 1"  (1.549 m)   Body mass index is  20.44 kg/m. Physical Exam Vitals reviewed.  Constitutional:      Comments: Sleepy  HENT:     Head: Normocephalic.     Nose: Nose normal.     Mouth/Throat:     Mouth: Mucous membranes are dry.     Pharynx: Oropharynx is clear.  Eyes:     Pupils: Pupils are equal, round, and reactive to light.  Cardiovascular:     Rate and Rhythm: Normal rate and regular rhythm.     Pulses: Normal pulses.  Pulmonary:     Effort: Pulmonary effort is normal.     Breath sounds: Normal breath sounds.  Abdominal:     General: Abdomen is flat. Bowel sounds are normal.     Palpations: Abdomen is soft.  Musculoskeletal:        General: No swelling.     Cervical back: Neck supple.  Skin:    General: Skin is dry.  Neurological:     General: No focal deficit present.  Psychiatric:        Mood and Affect: Mood normal.        Thought Content: Thought content normal.     Labs reviewed: Basic Metabolic Panel: Recent Labs    08/18/19 0000 12/14/19 0000 06/19/20 0000 06/29/20 0000 07/04/20 0000 07/25/20 0000 08/06/20 0000  NA 142   < > 138   < > 137 139 140  K 4.6   < > 5.0   < > 4.8 4.4 5.4*  CL 105   < > 102   < > 105 105 102  CO2 27*   < > 24*   < > 21 23* 23*  BUN 29*   < > 67*   < > 49* 32* 129*  CREATININE 1.0   < > 2.0*   < > 1.6* 1.4* 3.5*  CALCIUM 9.6   < > 9.8   < > 9.0 9.6 10.2  TSH 1.28  --  3.69  --   --   --   --    < > = values in this interval not displayed.   Liver Function Tests: Recent Labs    08/18/19 0000 06/19/20 0000 07/25/20 0000  AST 15 18 17   ALT 11 17 13   ALKPHOS 79 94 118  ALBUMIN  --  4.2 4.0   No results for input(s): LIPASE, AMYLASE in the last 8760 hours. No results for input(s): AMMONIA in the last 8760 hours. CBC: Recent Labs    06/19/20 0000 06/19/20 0000 06/29/20 0000 07/04/20 0000 07/25/20 0000  WBC 7.4   < > 6.3 6.7 6.8  NEUTROABS 4,662  --   --  4,007.00 4,400.00  HGB 10.9*   < >  10.3* 10.1* 10.8*  HCT 32*   < > 31* 29* 32*  PLT 315   --   --  284 351   < > = values in this interval not displayed.   Lipid Panel: Recent Labs    10/17/19 0000  CHOL 150  HDL 76*  LDLCALC 79  TRIG 55   Lab Results  Component Value Date   HGBA1C 5.7 09/30/2017    Procedures since last visit: No results found.  Assessment/Plan  Acute renal Failure Labs came back and her BUN is elevated with Creat and Potassium Nurses d/w the daughter she does not want her to go to ED Mrs [place] would not Allow [place] to do IV Discontinue Namenda, Remeron and Crestor Hospice consult Start on Roxanol Prn Change Seroquel to 12.5 mg BID prn  Dysuria Encourage Po Fluids Send UA and Culture  Weight loss Has lost 15 lbs   Alzheimer's dementia with behavioral disturbance, unspecified timing of dementia onset ([place]) Discontinue Namneda Continue low dose of Seroquel PRN for now Depression with anxiety Discontinue Remeron for now Hypothyroidism, unspecified type TSH normal in 9/22  Paroxysmal atrial fibrillation ([place]) On Coreg     Labs/tests ordered:  * No order type specified * Next appt:  Visit date not found  Total time spent in this patient care encounter was  45_  minutes; greater than 50% of the visit spent counseling family and  staff, reviewing records , Labs and coordinating care for problems addressed at this encounter.

## 2020-08-28 DEATH — deceased
